# Patient Record
Sex: Female | Born: 1945 | Race: White | Hispanic: No | Marital: Married | Smoking: Never smoker
Health system: Southern US, Community
[De-identification: ages and names within clinical notes are randomized; demographics above are authoritative.]

## PROBLEM LIST (undated history)

## (undated) DIAGNOSIS — I10 Essential (primary) hypertension: Secondary | ICD-10-CM

## (undated) DIAGNOSIS — I447 Left bundle-branch block, unspecified: Secondary | ICD-10-CM

## (undated) DIAGNOSIS — M1990 Unspecified osteoarthritis, unspecified site: Secondary | ICD-10-CM

## (undated) DIAGNOSIS — E079 Disorder of thyroid, unspecified: Secondary | ICD-10-CM

## (undated) HISTORY — PX: OOPHORECTOMY: SHX86

## (undated) HISTORY — PX: APPENDECTOMY: SHX54

## (undated) HISTORY — PX: TUBAL LIGATION: SHX77

## (undated) HISTORY — PX: OTHER SURGICAL HISTORY: SHX169

## (undated) HISTORY — PX: COLONOSCOPY: SHX174

---

## 2004-07-03 HISTORY — PX: OTHER SURGICAL HISTORY: SHX169

## 2005-05-29 ENCOUNTER — Ambulatory Visit (HOSPITAL_COMMUNITY): Admission: RE | Admit: 2005-05-29 | Discharge: 2005-05-29 | Payer: Self-pay | Admitting: Internal Medicine

## 2005-05-29 ENCOUNTER — Ambulatory Visit: Payer: Self-pay | Admitting: Internal Medicine

## 2007-07-04 DIAGNOSIS — E039 Hypothyroidism, unspecified: Secondary | ICD-10-CM

## 2007-07-04 HISTORY — DX: Hypothyroidism, unspecified: E03.9

## 2011-07-06 DIAGNOSIS — J0110 Acute frontal sinusitis, unspecified: Secondary | ICD-10-CM | POA: Diagnosis not present

## 2011-07-27 DIAGNOSIS — M069 Rheumatoid arthritis, unspecified: Secondary | ICD-10-CM | POA: Diagnosis not present

## 2011-08-10 DIAGNOSIS — M069 Rheumatoid arthritis, unspecified: Secondary | ICD-10-CM | POA: Diagnosis not present

## 2011-09-07 DIAGNOSIS — M069 Rheumatoid arthritis, unspecified: Secondary | ICD-10-CM | POA: Diagnosis not present

## 2011-09-14 DIAGNOSIS — Z79899 Other long term (current) drug therapy: Secondary | ICD-10-CM | POA: Diagnosis not present

## 2011-09-14 DIAGNOSIS — M069 Rheumatoid arthritis, unspecified: Secondary | ICD-10-CM | POA: Diagnosis not present

## 2011-11-02 DIAGNOSIS — M069 Rheumatoid arthritis, unspecified: Secondary | ICD-10-CM | POA: Diagnosis not present

## 2011-11-30 DIAGNOSIS — K5289 Other specified noninfective gastroenteritis and colitis: Secondary | ICD-10-CM | POA: Diagnosis not present

## 2011-12-18 DIAGNOSIS — M069 Rheumatoid arthritis, unspecified: Secondary | ICD-10-CM | POA: Diagnosis not present

## 2011-12-26 DIAGNOSIS — M069 Rheumatoid arthritis, unspecified: Secondary | ICD-10-CM | POA: Diagnosis not present

## 2012-01-02 DIAGNOSIS — R1933 Right lower quadrant abdominal rigidity: Secondary | ICD-10-CM | POA: Diagnosis not present

## 2012-01-02 DIAGNOSIS — E039 Hypothyroidism, unspecified: Secondary | ICD-10-CM | POA: Diagnosis not present

## 2012-01-08 DIAGNOSIS — M069 Rheumatoid arthritis, unspecified: Secondary | ICD-10-CM | POA: Diagnosis not present

## 2012-01-08 DIAGNOSIS — E7800 Pure hypercholesterolemia, unspecified: Secondary | ICD-10-CM | POA: Diagnosis not present

## 2012-01-08 DIAGNOSIS — I10 Essential (primary) hypertension: Secondary | ICD-10-CM | POA: Diagnosis not present

## 2012-01-08 DIAGNOSIS — E039 Hypothyroidism, unspecified: Secondary | ICD-10-CM | POA: Diagnosis not present

## 2012-02-22 DIAGNOSIS — M069 Rheumatoid arthritis, unspecified: Secondary | ICD-10-CM | POA: Diagnosis not present

## 2012-04-18 DIAGNOSIS — M069 Rheumatoid arthritis, unspecified: Secondary | ICD-10-CM | POA: Diagnosis not present

## 2012-06-03 DIAGNOSIS — J029 Acute pharyngitis, unspecified: Secondary | ICD-10-CM | POA: Diagnosis not present

## 2012-06-03 DIAGNOSIS — J040 Acute laryngitis: Secondary | ICD-10-CM | POA: Diagnosis not present

## 2012-06-11 DIAGNOSIS — M899 Disorder of bone, unspecified: Secondary | ICD-10-CM | POA: Diagnosis not present

## 2012-06-11 DIAGNOSIS — Z1231 Encounter for screening mammogram for malignant neoplasm of breast: Secondary | ICD-10-CM | POA: Diagnosis not present

## 2012-06-11 DIAGNOSIS — M949 Disorder of cartilage, unspecified: Secondary | ICD-10-CM | POA: Diagnosis not present

## 2012-06-13 DIAGNOSIS — M069 Rheumatoid arthritis, unspecified: Secondary | ICD-10-CM | POA: Diagnosis not present

## 2012-06-19 DIAGNOSIS — Z23 Encounter for immunization: Secondary | ICD-10-CM | POA: Diagnosis not present

## 2012-06-19 DIAGNOSIS — M069 Rheumatoid arthritis, unspecified: Secondary | ICD-10-CM | POA: Diagnosis not present

## 2012-07-01 DIAGNOSIS — E7800 Pure hypercholesterolemia, unspecified: Secondary | ICD-10-CM | POA: Diagnosis not present

## 2012-07-01 DIAGNOSIS — I10 Essential (primary) hypertension: Secondary | ICD-10-CM | POA: Diagnosis not present

## 2012-07-10 DIAGNOSIS — I10 Essential (primary) hypertension: Secondary | ICD-10-CM | POA: Diagnosis not present

## 2012-07-10 DIAGNOSIS — M069 Rheumatoid arthritis, unspecified: Secondary | ICD-10-CM | POA: Diagnosis not present

## 2012-07-10 DIAGNOSIS — E7800 Pure hypercholesterolemia, unspecified: Secondary | ICD-10-CM | POA: Diagnosis not present

## 2012-07-10 DIAGNOSIS — E039 Hypothyroidism, unspecified: Secondary | ICD-10-CM | POA: Diagnosis not present

## 2012-08-08 DIAGNOSIS — M069 Rheumatoid arthritis, unspecified: Secondary | ICD-10-CM | POA: Diagnosis not present

## 2012-10-03 DIAGNOSIS — M069 Rheumatoid arthritis, unspecified: Secondary | ICD-10-CM | POA: Diagnosis not present

## 2012-11-28 DIAGNOSIS — M069 Rheumatoid arthritis, unspecified: Secondary | ICD-10-CM | POA: Diagnosis not present

## 2012-12-03 DIAGNOSIS — M069 Rheumatoid arthritis, unspecified: Secondary | ICD-10-CM | POA: Diagnosis not present

## 2012-12-30 DIAGNOSIS — M069 Rheumatoid arthritis, unspecified: Secondary | ICD-10-CM | POA: Diagnosis not present

## 2012-12-31 DIAGNOSIS — E039 Hypothyroidism, unspecified: Secondary | ICD-10-CM | POA: Diagnosis not present

## 2012-12-31 DIAGNOSIS — E7800 Pure hypercholesterolemia, unspecified: Secondary | ICD-10-CM | POA: Diagnosis not present

## 2013-01-06 DIAGNOSIS — E7800 Pure hypercholesterolemia, unspecified: Secondary | ICD-10-CM | POA: Diagnosis not present

## 2013-01-06 DIAGNOSIS — I10 Essential (primary) hypertension: Secondary | ICD-10-CM | POA: Diagnosis not present

## 2013-01-06 DIAGNOSIS — M069 Rheumatoid arthritis, unspecified: Secondary | ICD-10-CM | POA: Diagnosis not present

## 2013-01-06 DIAGNOSIS — E039 Hypothyroidism, unspecified: Secondary | ICD-10-CM | POA: Diagnosis not present

## 2013-01-07 DIAGNOSIS — M069 Rheumatoid arthritis, unspecified: Secondary | ICD-10-CM | POA: Diagnosis not present

## 2013-04-10 DIAGNOSIS — Z23 Encounter for immunization: Secondary | ICD-10-CM | POA: Diagnosis not present

## 2013-04-10 DIAGNOSIS — M069 Rheumatoid arthritis, unspecified: Secondary | ICD-10-CM | POA: Diagnosis not present

## 2013-06-03 DIAGNOSIS — T887XXA Unspecified adverse effect of drug or medicament, initial encounter: Secondary | ICD-10-CM | POA: Diagnosis not present

## 2013-06-03 DIAGNOSIS — M069 Rheumatoid arthritis, unspecified: Secondary | ICD-10-CM | POA: Diagnosis not present

## 2013-07-01 DIAGNOSIS — E7800 Pure hypercholesterolemia, unspecified: Secondary | ICD-10-CM | POA: Diagnosis not present

## 2013-07-01 DIAGNOSIS — I10 Essential (primary) hypertension: Secondary | ICD-10-CM | POA: Diagnosis not present

## 2013-07-01 DIAGNOSIS — E039 Hypothyroidism, unspecified: Secondary | ICD-10-CM | POA: Diagnosis not present

## 2013-07-08 DIAGNOSIS — I10 Essential (primary) hypertension: Secondary | ICD-10-CM | POA: Diagnosis not present

## 2013-07-08 DIAGNOSIS — Z23 Encounter for immunization: Secondary | ICD-10-CM | POA: Diagnosis not present

## 2013-07-08 DIAGNOSIS — M069 Rheumatoid arthritis, unspecified: Secondary | ICD-10-CM | POA: Diagnosis not present

## 2013-07-08 DIAGNOSIS — E039 Hypothyroidism, unspecified: Secondary | ICD-10-CM | POA: Diagnosis not present

## 2013-07-08 DIAGNOSIS — E7800 Pure hypercholesterolemia, unspecified: Secondary | ICD-10-CM | POA: Diagnosis not present

## 2013-07-14 DIAGNOSIS — D72819 Decreased white blood cell count, unspecified: Secondary | ICD-10-CM | POA: Diagnosis not present

## 2013-07-14 DIAGNOSIS — M069 Rheumatoid arthritis, unspecified: Secondary | ICD-10-CM | POA: Diagnosis not present

## 2013-07-18 DIAGNOSIS — Z01419 Encounter for gynecological examination (general) (routine) without abnormal findings: Secondary | ICD-10-CM | POA: Diagnosis not present

## 2013-07-18 DIAGNOSIS — Z1231 Encounter for screening mammogram for malignant neoplasm of breast: Secondary | ICD-10-CM | POA: Diagnosis not present

## 2013-10-13 DIAGNOSIS — M069 Rheumatoid arthritis, unspecified: Secondary | ICD-10-CM | POA: Diagnosis not present

## 2013-10-13 DIAGNOSIS — D72819 Decreased white blood cell count, unspecified: Secondary | ICD-10-CM | POA: Diagnosis not present

## 2013-11-10 DIAGNOSIS — M069 Rheumatoid arthritis, unspecified: Secondary | ICD-10-CM | POA: Diagnosis not present

## 2013-12-11 DIAGNOSIS — M069 Rheumatoid arthritis, unspecified: Secondary | ICD-10-CM | POA: Diagnosis not present

## 2013-12-22 DIAGNOSIS — M069 Rheumatoid arthritis, unspecified: Secondary | ICD-10-CM | POA: Diagnosis not present

## 2013-12-22 DIAGNOSIS — J209 Acute bronchitis, unspecified: Secondary | ICD-10-CM | POA: Diagnosis not present

## 2013-12-22 DIAGNOSIS — J069 Acute upper respiratory infection, unspecified: Secondary | ICD-10-CM | POA: Diagnosis not present

## 2014-01-05 DIAGNOSIS — E039 Hypothyroidism, unspecified: Secondary | ICD-10-CM | POA: Diagnosis not present

## 2014-01-05 DIAGNOSIS — E7800 Pure hypercholesterolemia, unspecified: Secondary | ICD-10-CM | POA: Diagnosis not present

## 2014-01-05 DIAGNOSIS — I10 Essential (primary) hypertension: Secondary | ICD-10-CM | POA: Diagnosis not present

## 2014-01-05 DIAGNOSIS — M069 Rheumatoid arthritis, unspecified: Secondary | ICD-10-CM | POA: Diagnosis not present

## 2014-01-08 DIAGNOSIS — M069 Rheumatoid arthritis, unspecified: Secondary | ICD-10-CM | POA: Diagnosis not present

## 2014-01-12 DIAGNOSIS — I10 Essential (primary) hypertension: Secondary | ICD-10-CM | POA: Diagnosis not present

## 2014-01-12 DIAGNOSIS — E7800 Pure hypercholesterolemia, unspecified: Secondary | ICD-10-CM | POA: Diagnosis not present

## 2014-01-12 DIAGNOSIS — E039 Hypothyroidism, unspecified: Secondary | ICD-10-CM | POA: Diagnosis not present

## 2014-01-12 DIAGNOSIS — M069 Rheumatoid arthritis, unspecified: Secondary | ICD-10-CM | POA: Diagnosis not present

## 2014-01-20 DIAGNOSIS — D72819 Decreased white blood cell count, unspecified: Secondary | ICD-10-CM | POA: Diagnosis not present

## 2014-01-20 DIAGNOSIS — M069 Rheumatoid arthritis, unspecified: Secondary | ICD-10-CM | POA: Diagnosis not present

## 2014-02-05 DIAGNOSIS — M069 Rheumatoid arthritis, unspecified: Secondary | ICD-10-CM | POA: Diagnosis not present

## 2014-03-23 DIAGNOSIS — M069 Rheumatoid arthritis, unspecified: Secondary | ICD-10-CM | POA: Diagnosis not present

## 2014-04-28 DIAGNOSIS — H2513 Age-related nuclear cataract, bilateral: Secondary | ICD-10-CM | POA: Diagnosis not present

## 2014-04-28 DIAGNOSIS — M0609 Rheumatoid arthritis without rheumatoid factor, multiple sites: Secondary | ICD-10-CM | POA: Diagnosis not present

## 2014-04-28 DIAGNOSIS — Z79899 Other long term (current) drug therapy: Secondary | ICD-10-CM | POA: Diagnosis not present

## 2014-05-25 DIAGNOSIS — D702 Other drug-induced agranulocytosis: Secondary | ICD-10-CM | POA: Diagnosis not present

## 2014-05-25 DIAGNOSIS — M0579 Rheumatoid arthritis with rheumatoid factor of multiple sites without organ or systems involvement: Secondary | ICD-10-CM | POA: Diagnosis not present

## 2014-06-29 DIAGNOSIS — E780 Pure hypercholesterolemia: Secondary | ICD-10-CM | POA: Diagnosis not present

## 2014-06-29 DIAGNOSIS — E039 Hypothyroidism, unspecified: Secondary | ICD-10-CM | POA: Diagnosis not present

## 2014-06-29 DIAGNOSIS — I10 Essential (primary) hypertension: Secondary | ICD-10-CM | POA: Diagnosis not present

## 2014-07-06 DIAGNOSIS — M05719 Rheumatoid arthritis with rheumatoid factor of unspecified shoulder without organ or systems involvement: Secondary | ICD-10-CM | POA: Diagnosis not present

## 2014-07-06 DIAGNOSIS — E039 Hypothyroidism, unspecified: Secondary | ICD-10-CM | POA: Diagnosis not present

## 2014-07-06 DIAGNOSIS — I10 Essential (primary) hypertension: Secondary | ICD-10-CM | POA: Diagnosis not present

## 2014-07-06 DIAGNOSIS — E780 Pure hypercholesterolemia: Secondary | ICD-10-CM | POA: Diagnosis not present

## 2014-07-06 DIAGNOSIS — Z1389 Encounter for screening for other disorder: Secondary | ICD-10-CM | POA: Diagnosis not present

## 2014-07-23 DIAGNOSIS — Z1231 Encounter for screening mammogram for malignant neoplasm of breast: Secondary | ICD-10-CM | POA: Diagnosis not present

## 2014-08-18 DIAGNOSIS — E039 Hypothyroidism, unspecified: Secondary | ICD-10-CM | POA: Diagnosis not present

## 2014-08-18 DIAGNOSIS — Z23 Encounter for immunization: Secondary | ICD-10-CM | POA: Diagnosis not present

## 2014-09-21 DIAGNOSIS — D702 Other drug-induced agranulocytosis: Secondary | ICD-10-CM | POA: Diagnosis not present

## 2014-09-21 DIAGNOSIS — M0579 Rheumatoid arthritis with rheumatoid factor of multiple sites without organ or systems involvement: Secondary | ICD-10-CM | POA: Diagnosis not present

## 2014-12-28 DIAGNOSIS — E039 Hypothyroidism, unspecified: Secondary | ICD-10-CM | POA: Diagnosis not present

## 2014-12-28 DIAGNOSIS — E780 Pure hypercholesterolemia: Secondary | ICD-10-CM | POA: Diagnosis not present

## 2014-12-28 DIAGNOSIS — I10 Essential (primary) hypertension: Secondary | ICD-10-CM | POA: Diagnosis not present

## 2015-01-07 DIAGNOSIS — I10 Essential (primary) hypertension: Secondary | ICD-10-CM | POA: Diagnosis not present

## 2015-01-07 DIAGNOSIS — M0579 Rheumatoid arthritis with rheumatoid factor of multiple sites without organ or systems involvement: Secondary | ICD-10-CM | POA: Diagnosis not present

## 2015-01-07 DIAGNOSIS — E039 Hypothyroidism, unspecified: Secondary | ICD-10-CM | POA: Diagnosis not present

## 2015-01-07 DIAGNOSIS — E780 Pure hypercholesterolemia: Secondary | ICD-10-CM | POA: Diagnosis not present

## 2015-03-25 DIAGNOSIS — M0589 Other rheumatoid arthritis with rheumatoid factor of multiple sites: Secondary | ICD-10-CM | POA: Diagnosis not present

## 2015-03-25 DIAGNOSIS — D702 Other drug-induced agranulocytosis: Secondary | ICD-10-CM | POA: Diagnosis not present

## 2015-04-15 ENCOUNTER — Telehealth: Payer: Self-pay | Admitting: Internal Medicine

## 2015-04-15 ENCOUNTER — Encounter: Payer: Self-pay | Admitting: Internal Medicine

## 2015-04-15 NOTE — Telephone Encounter (Signed)
Letter mailed to pt.  

## 2015-04-15 NOTE — Telephone Encounter (Signed)
NOV RECALL FOE TCS

## 2015-04-22 ENCOUNTER — Telehealth: Payer: Self-pay

## 2015-04-22 NOTE — Telephone Encounter (Signed)
Pt received a letter that it is time for her next colonoscopy.  She gave me some triage info, but wants to call her insurance, before she schedules and she will call back.

## 2015-04-26 ENCOUNTER — Other Ambulatory Visit: Payer: Self-pay

## 2015-04-26 DIAGNOSIS — Z1211 Encounter for screening for malignant neoplasm of colon: Secondary | ICD-10-CM

## 2015-04-26 MED ORDER — PEG 3350-KCL-NA BICARB-NACL 420 G PO SOLR: 4000.0000 mL | ORAL | Status: DC

## 2015-04-26 NOTE — Telephone Encounter (Signed)
Appropriate.

## 2015-04-26 NOTE — Telephone Encounter (Signed)
Rx sent to the pharmacy and instructions mailed to pt.  

## 2015-04-26 NOTE — Telephone Encounter (Signed)
Gastroenterology Pre-Procedure Review  Request Date: 04/22/2015 Requesting Physician: PT WAS ON RECALL LAST COLONOSCOPY WAS 05/29/2005 BY DR. Jena Gauss  PATIENT REVIEW QUESTIONS: The patient responded to the following health history questions as indicated:    1. Diabetes Melitis: no 2. Joint replacements in the past 12 months: no 3. Major health problems in the past 3 months: no 4. Has an artificial valve or MVP: no 5. Has a defibrillator: no 6. Has been advised in past to take antibiotics in advance of a procedure like teeth cleaning: no 7. Family history of colon cancer: no  8. Alcohol Use: no 9. History of sleep apnea: no     MEDICATIONS & ALLERGIES:    Patient reports the following regarding taking any blood thinners:   Plavix? no Aspirin? no Coumadin? no  Patient confirms/reports the following medications:  Current Outpatient Prescriptions  Medication Sig Dispense Refill  . hydroxychloroquine (PLAQUENIL) 200 MG tablet Take 200 mg by mouth daily.    Marland Kitchen levothyroxine (SYNTHROID, LEVOTHROID) 100 MCG tablet Take 100 mcg by mouth daily before breakfast.     No current facility-administered medications for this visit.    Patient confirms/reports the following allergies:  Allergies  Allergen Reactions  . Orencia [Abatacept]     No orders of the defined types were placed in this encounter.    AUTHORIZATION INFORMATION Primary Insurance:   ID #:  Group #:  Pre-Cert / Auth required:  Pre-Cert / Auth #:   Secondary Insurance: ID #:  Group #:  Pre-Cert / Auth required:  Pre-Cert / Auth #:   SCHEDULE INFORMATION: Procedure has been scheduled as follows:  Date:    05/18/2015                 Time:  11:30 am Location: [facility] Short Stay  This Gastroenterology Pre-Precedure Review Form is being routed to the following provider(s): R. Roetta Sessions, MD

## 2015-05-12 ENCOUNTER — Telehealth: Payer: Self-pay

## 2015-05-12 NOTE — Telephone Encounter (Signed)
I called [employer] @ [phone] and spoke to [place] J who said that a PA is not required for screening colonoscopy as outpatient.

## 2015-05-18 ENCOUNTER — Encounter (HOSPITAL_COMMUNITY): Payer: Self-pay | Admitting: *Deleted

## 2015-05-18 ENCOUNTER — Ambulatory Visit (HOSPITAL_COMMUNITY)
Admission: RE | Admit: 2015-05-18 | Discharge: 2015-05-18 | Disposition: A | Discharge status: Home / Self Care | Payer: Medicare Other | Source: Ambulatory Visit | Attending: Internal Medicine | Admitting: Internal Medicine

## 2015-05-18 ENCOUNTER — Encounter (HOSPITAL_COMMUNITY)
Admission: RE | Disposition: A | Discharge status: Home / Self Care | Payer: Self-pay | Source: Ambulatory Visit | Attending: Internal Medicine

## 2015-05-18 DIAGNOSIS — K5730 Diverticulosis of large intestine without perforation or abscess without bleeding: Secondary | ICD-10-CM | POA: Diagnosis not present

## 2015-05-18 DIAGNOSIS — M069 Rheumatoid arthritis, unspecified: Secondary | ICD-10-CM | POA: Diagnosis not present

## 2015-05-18 DIAGNOSIS — Z888 Allergy status to other drugs, medicaments and biological substances status: Secondary | ICD-10-CM | POA: Insufficient documentation

## 2015-05-18 DIAGNOSIS — Z79899 Other long term (current) drug therapy: Secondary | ICD-10-CM | POA: Insufficient documentation

## 2015-05-18 DIAGNOSIS — Z1211 Encounter for screening for malignant neoplasm of colon: Secondary | ICD-10-CM | POA: Insufficient documentation

## 2015-05-18 HISTORY — DX: Unspecified osteoarthritis, unspecified site: M19.90

## 2015-05-18 HISTORY — PX: COLONOSCOPY: SHX5424

## 2015-05-18 SURGERY — COLONOSCOPY
Anesthesia: Moderate Sedation

## 2015-05-18 MED ORDER — MIDAZOLAM HCL 5 MG/5ML IJ SOLN
INTRAMUSCULAR | Status: DC | PRN
  Administered 2015-05-18: 2 mg via INTRAVENOUS
  Administered 2015-05-18 (×3): 1 mg via INTRAVENOUS

## 2015-05-18 MED ORDER — MEPERIDINE HCL 100 MG/ML IJ SOLN
INTRAMUSCULAR | Status: AC
  Filled 2015-05-18: qty 2

## 2015-05-18 MED ORDER — SIMETHICONE 40 MG/0.6ML PO SUSP
ORAL | Status: DC | PRN
  Administered 2015-05-18: 11:00:00

## 2015-05-18 MED ORDER — ONDANSETRON HCL 4 MG/2ML IJ SOLN
INTRAMUSCULAR | Status: AC
  Filled 2015-05-18: qty 2

## 2015-05-18 MED ORDER — MIDAZOLAM HCL 5 MG/5ML IJ SOLN
INTRAMUSCULAR | Status: AC
  Filled 2015-05-18: qty 10

## 2015-05-18 MED ORDER — ONDANSETRON HCL 4 MG/2ML IJ SOLN
INTRAMUSCULAR | Status: DC | PRN
  Administered 2015-05-18: 4 mg via INTRAVENOUS

## 2015-05-18 MED ORDER — SODIUM CHLORIDE 0.9 % IV SOLN
INTRAVENOUS | Status: DC
  Administered 2015-05-18: 11:00:00 via INTRAVENOUS

## 2015-05-18 MED ORDER — MEPERIDINE HCL 100 MG/ML IJ SOLN
INTRAMUSCULAR | Status: DC | PRN
  Administered 2015-05-18: 25 mg via INTRAVENOUS
  Administered 2015-05-18: 50 mg via INTRAVENOUS

## 2015-05-18 NOTE — Discharge Instructions (Signed)
Colonoscopy Discharge Instructions  Read the instructions outlined below and refer to this sheet in the next few weeks. These discharge instructions provide you with general information on caring for yourself after you leave the hospital. Your doctor may also give you specific instructions. While your treatment has been planned according to the most current medical practices available, unavoidable complications occasionally occur. If you have any problems or questions after discharge, call Dr. Jena Gauss at [phone]. ACTIVITY  You may resume your regular activity, but move at a slower pace for the next 24 hours.   Take frequent rest periods for the next 24 hours.   Walking will help get rid of the air and reduce the bloated feeling in your belly (abdomen).   No driving for 24 hours (because of the medicine (anesthesia) used during the test).    Do not sign any important legal documents or operate any machinery for 24 hours (because of the anesthesia used during the test).  NUTRITION  Drink plenty of fluids.   You may resume your normal diet as instructed by your doctor.   Begin with a light meal and progress to your normal diet. Heavy or fried foods are harder to digest and may make you feel sick to your stomach (nauseated).   Avoid alcoholic beverages for 24 hours or as instructed.  MEDICATIONS  You may resume your normal medications unless your doctor tells you otherwise.  WHAT YOU CAN EXPECT TODAY  Some feelings of bloating in the abdomen.   Passage of more gas than usual.   Spotting of blood in your stool or on the toilet paper.  IF YOU HAD POLYPS REMOVED DURING THE COLONOSCOPY:  No aspirin products for 7 days or as instructed.   No alcohol for 7 days or as instructed.   Eat a soft diet for the next 24 hours.  FINDING OUT THE RESULTS OF YOUR TEST Not all test results are available during your visit. If your test results are not back during the visit, make an appointment  with your caregiver to find out the results. Do not assume everything is normal if you have not heard from your caregiver or the medical facility. It is important for you to follow up on all of your test results.  SEEK IMMEDIATE MEDICAL ATTENTION IF:  You have more than a spotting of blood in your stool.   Your belly is swollen (abdominal distention).   You are nauseated or vomiting.   You have a temperature over 101.   You have abdominal pain or discomfort that is severe or gets worse throughout the day.   Diverticulosis information provided  I do not recommend a future screening colonoscopy unless new symptoms develop Diverticulosis Diverticulosis is the condition that develops when small pouches (diverticula) form in the wall of your colon. Your colon, or large intestine, is where water is absorbed and stool is formed. The pouches form when the inside layer of your colon pushes through weak spots in the outer layers of your colon. CAUSES  No one knows exactly what causes diverticulosis. RISK FACTORS  Being older than 50. Your risk for this condition increases with age. Diverticulosis is rare in people younger than 40 years. By age 82, almost everyone has it.  Eating a low-fiber diet.  Being frequently constipated.  Being overweight.  Not getting enough exercise.  Smoking.  Taking over-the-counter pain medicines, like aspirin and ibuprofen. SYMPTOMS  Most people with diverticulosis do not have symptoms. DIAGNOSIS  Because diverticulosis  often has no symptoms, health care providers often discover the condition during an exam for other colon problems. In many cases, a health care provider will diagnose diverticulosis while using a flexible scope to examine the colon (colonoscopy). TREATMENT  If you have never developed an infection related to diverticulosis, you may not need treatment. If you have had an infection before, treatment may include:  Eating more fruits,  vegetables, and grains.  Taking a fiber supplement.  Taking a live bacteria supplement (probiotic).  Taking medicine to relax your colon. HOME CARE INSTRUCTIONS   Drink at least 6-8 glasses of water each day to prevent constipation.  Try not to strain when you have a bowel movement.  Keep all follow-up appointments. If you have had an infection before:  Increase the fiber in your diet as directed by your health care provider or dietitian.  Take a dietary fiber supplement if your health care provider approves.  Only take medicines as directed by your health care provider. SEEK MEDICAL CARE IF:   You have abdominal pain.  You have bloating.  You have cramps.  You have not gone to the bathroom in 3 days. SEEK IMMEDIATE MEDICAL CARE IF:   Your pain gets worse.  Yourbloating becomes very bad.  You have a fever or chills, and your symptoms suddenly get worse.  You begin vomiting.  You have bowel movements that are bloody or black. MAKE SURE YOU:  Understand these instructions.  Will watch your condition.  Will get help right away if you are not doing well or get worse.   This information is not intended to replace advice given to you by your health care provider. Make sure you discuss any questions you have with your health care provider.   Document Released: 03/16/2004 Document Revised: 06/24/2013 Document Reviewed: 05/14/2013 Elsevier Interactive Patient Education [employer]

## 2015-05-18 NOTE — Op Note (Signed)
[facility] [address] [place], [address]   COLONOSCOPY PROCEDURE REPORT  PATIENT: Deborah Kerr, Deborah Kerr  MR#: [id number] BIRTHDATE: Sep 02, 1945 , 69  yrs. old GENDER: female ENDOSCOPIST: R.  Roetta Sessions, MD FACP [facility] REFERRED OT:[facility] Neita Carp, M.D. PROCEDURE DATE:  06/08/15 PROCEDURE:   Colonoscopy, screening INDICATIONS:Average risk colorectal cancer screening examination. MEDICATIONS: Versed 5 mg IV and Demerol 75 mg IV in divided doses. Zofran 4 mg IV. ASA CLASS:       Class II  CONSENT: The risks, benefits, alternatives and imponderables including but not limited to bleeding, perforation as well as the possibility of a missed lesion have been reviewed.  The potential for biopsy, lesion removal, etc. have also been discussed. Questions have been answered.  All parties agreeable.  Please see the history and physical in the medical record for more information.  DESCRIPTION OF PROCEDURE:   After the risks benefits and alternatives of the procedure were thoroughly explained, informed consent was obtained.  The digital rectal exam revealed no abnormalities of the rectum.   The EC-[id number] ([id number])  endoscope was introduced through the anus and advanced to the cecum, which was identified by both the appendix and ileocecal valve. No adverse events experienced.   The quality of the prep was adequate  The instrument was then slowly withdrawn as the colon was fully examined. Estimated blood loss is zero unless otherwise noted in this procedure report.      COLON FINDINGS: Normal-appearing rectal mucosa.  Scattered left-sided diverticula; the remainder the colonic mucosa appeared normal.  Retroflexion was performed. .  Withdrawal time=6 minutes 0 seconds.  The scope was withdrawn and the procedure completed. COMPLICATIONS: There were no immediate complications.  ENDOSCOPIC IMPRESSION: Colonic diverticulosis  RECOMMENDATIONS: I do not recommend a future  colonoscopy unless new symptoms develop.  eSigned:  R. Roetta Sessions, MD Jerrel Ivory [facility] 2015-06-08 12:06 PM   cc:  CPT CODES: ICD CODES:  The ICD and CPT codes recommended by this software are interpretations from the data that the clinical staff has captured with the software.  The verification of the translation of this report to the ICD and CPT codes and modifiers is the sole responsibility of the health care institution and practicing physician where this report was generated.  [employer] will not be held responsible for the validity of the ICD and CPT codes included on this report.  AMA assumes no liability for data contained or not contained herein. CPT is a Publishing rights manager of the [employer].  PATIENT NAME:  Deborah Kerr, Deborah Kerr MR#: [id number]

## 2015-05-18 NOTE — H&P (Signed)
@  JOAC@   Primary Care Physician:  Estanislado Pandy, MD Primary Gastroenterologist:  Dr. Jena Gauss  Pre-Procedure History & Physical: HPI:  Deborah Kerr is a 69 y.o. female is here for  a screening colonoscopy.  No bowel symptoms. No family history of colon cancer. Negative Colonoscopy 10 years ago.     Past Medical History  Diagnosis Date  . Arthritis     rheumatoid arthritis    Past Surgical History  Procedure Laterality Date  . Thyroidectomy    . Oophorectomy    . Colonoscopy      Prior to Admission medications   Medication Sig Start Date End Date Taking? Authorizing Provider  hydroxychloroquine (PLAQUENIL) 200 MG tablet Take 200 mg by mouth daily.   Yes Historical Provider, MD  ibuprofen (ADVIL,MOTRIN) 200 MG tablet Take 200 mg by mouth every 6 (six) hours as needed.   Yes Historical Provider, MD  leAverage risk screening examination.vothyroxine (SYNTHROID, LEVOTHROID) 100 MCG tablet Take 100 mcg by mouth daily before breakfast.   Yes Historical Provider, MD  polyethylene glycol-electrolytes (TRILYTE) 420 G solution Take 4,000 mLs by mouth as directed. 04/26/15  Yes Corbin Ade, MD    Allergies as of 04/26/2015 - Review Complete 04/26/2015  Allergen Reaction Noted  . Orencia [abatacept]  04/26/2015    History reviewed. No pertinent family history.  Social History   Social History  . Marital Status: Married    Spouse Name: N/A  . Number of Children: N/A  . Years of Education: N/A   Occupational History  . Not on file.   Social History Main Topics  . Smoking status: Never Smoker   . Smokeless tobacco: Not on file  . Alcohol Use: No  . Drug Use: No  . Sexual Activity: Not on file   Other Topics Concern  . Not on file   Social History Narrative  . No narrative on file    Review of Systems: See HPI, otherwise negative ROS  Physical Exam: BP 160/79 mmHg  Pulse 73  Temp(Src) 97.7 F (36.5 C) (Oral)  Resp 12  Ht 5\' 3"  (1.6 m)  Wt 137 lb (62.143 kg)   BMI 24.27 kg/m2  SpO2 100% General:   Alert,  Well-developed, well-nourished, pleasant and cooperative in NAD Head:  Normocephalic and atraumatic. Eyes:  Sclera clear, no icterus.   Conjunctiva pink. Lungs:  Clear throughout to auscultation.   No wheezes, crackles, or rhonchi. No acute distress. Heart:  Regular rate and rhythm; no murmurs, clicks, rubs,  or gallops. Abdomen:  Soft, nontender and nondistended. No masses, hepatosplenomegaly or hernias noted. Normal bowel sounds, without guarding, and without rebound.   Extremities:  Without clubbing or edema. Neurologic:  Alert and  oriented x4;  grossly normal neurologically.   Impression/Plan: Deborah Kerr is now here to undergo a screening colonoscopy.   Risks, benefits, limitations, imponderables and alternatives regarding colonoscopy have been reviewed with the patient. Questions have been answered. All parties agreeable.     Notice:  This dictation was prepared with Dragon dictation along with smaller phrase technology. Any transcriptional errors that result from this process are unintentional and may not be corrected upon review.

## 2015-05-20 DIAGNOSIS — Z23 Encounter for immunization: Secondary | ICD-10-CM | POA: Diagnosis not present

## 2015-05-25 ENCOUNTER — Encounter (HOSPITAL_COMMUNITY): Payer: Self-pay | Admitting: Internal Medicine

## 2015-06-22 DIAGNOSIS — M0609 Rheumatoid arthritis without rheumatoid factor, multiple sites: Secondary | ICD-10-CM | POA: Diagnosis not present

## 2015-06-22 DIAGNOSIS — H2513 Age-related nuclear cataract, bilateral: Secondary | ICD-10-CM | POA: Diagnosis not present

## 2015-06-22 DIAGNOSIS — Z79899 Other long term (current) drug therapy: Secondary | ICD-10-CM | POA: Diagnosis not present

## 2015-07-22 DIAGNOSIS — E039 Hypothyroidism, unspecified: Secondary | ICD-10-CM | POA: Diagnosis not present

## 2015-07-28 DIAGNOSIS — M0579 Rheumatoid arthritis with rheumatoid factor of multiple sites without organ or systems involvement: Secondary | ICD-10-CM | POA: Diagnosis not present

## 2015-07-28 DIAGNOSIS — I10 Essential (primary) hypertension: Secondary | ICD-10-CM | POA: Diagnosis not present

## 2015-07-28 DIAGNOSIS — E039 Hypothyroidism, unspecified: Secondary | ICD-10-CM | POA: Diagnosis not present

## 2015-07-28 DIAGNOSIS — Z1389 Encounter for screening for other disorder: Secondary | ICD-10-CM | POA: Diagnosis not present

## 2015-08-21 DIAGNOSIS — J111 Influenza due to unidentified influenza virus with other respiratory manifestations: Secondary | ICD-10-CM | POA: Diagnosis not present

## 2015-09-01 DIAGNOSIS — J209 Acute bronchitis, unspecified: Secondary | ICD-10-CM | POA: Diagnosis not present

## 2015-09-03 DIAGNOSIS — Z01419 Encounter for gynecological examination (general) (routine) without abnormal findings: Secondary | ICD-10-CM | POA: Diagnosis not present

## 2015-09-03 DIAGNOSIS — Z1231 Encounter for screening mammogram for malignant neoplasm of breast: Secondary | ICD-10-CM | POA: Diagnosis not present

## 2015-09-22 DIAGNOSIS — M0589 Other rheumatoid arthritis with rheumatoid factor of multiple sites: Secondary | ICD-10-CM | POA: Diagnosis not present

## 2015-09-22 DIAGNOSIS — D702 Other drug-induced agranulocytosis: Secondary | ICD-10-CM | POA: Diagnosis not present

## 2015-09-22 DIAGNOSIS — M7989 Other specified soft tissue disorders: Secondary | ICD-10-CM | POA: Diagnosis not present

## 2016-01-21 DIAGNOSIS — I10 Essential (primary) hypertension: Secondary | ICD-10-CM | POA: Diagnosis not present

## 2016-01-21 DIAGNOSIS — E7800 Pure hypercholesterolemia, unspecified: Secondary | ICD-10-CM | POA: Diagnosis not present

## 2016-01-21 DIAGNOSIS — E039 Hypothyroidism, unspecified: Secondary | ICD-10-CM | POA: Diagnosis not present

## 2016-01-21 DIAGNOSIS — M05719 Rheumatoid arthritis with rheumatoid factor of unspecified shoulder without organ or systems involvement: Secondary | ICD-10-CM | POA: Diagnosis not present

## 2016-01-24 DIAGNOSIS — Z6824 Body mass index (BMI) 24.0-24.9, adult: Secondary | ICD-10-CM | POA: Diagnosis not present

## 2016-01-24 DIAGNOSIS — I10 Essential (primary) hypertension: Secondary | ICD-10-CM | POA: Diagnosis not present

## 2016-01-24 DIAGNOSIS — Z13220 Encounter for screening for lipoid disorders: Secondary | ICD-10-CM | POA: Diagnosis not present

## 2016-01-24 DIAGNOSIS — E039 Hypothyroidism, unspecified: Secondary | ICD-10-CM | POA: Diagnosis not present

## 2016-01-24 DIAGNOSIS — M0579 Rheumatoid arthritis with rheumatoid factor of multiple sites without organ or systems involvement: Secondary | ICD-10-CM | POA: Diagnosis not present

## 2016-02-29 ENCOUNTER — Other Ambulatory Visit: Payer: Self-pay

## 2016-03-24 DIAGNOSIS — D702 Other drug-induced agranulocytosis: Secondary | ICD-10-CM | POA: Diagnosis not present

## 2016-03-24 DIAGNOSIS — M0589 Other rheumatoid arthritis with rheumatoid factor of multiple sites: Secondary | ICD-10-CM | POA: Diagnosis not present

## 2016-03-24 DIAGNOSIS — Z79899 Other long term (current) drug therapy: Secondary | ICD-10-CM | POA: Diagnosis not present

## 2016-06-02 DIAGNOSIS — Z23 Encounter for immunization: Secondary | ICD-10-CM | POA: Diagnosis not present

## 2016-06-30 DIAGNOSIS — Z79899 Other long term (current) drug therapy: Secondary | ICD-10-CM | POA: Diagnosis not present

## 2016-06-30 DIAGNOSIS — H2513 Age-related nuclear cataract, bilateral: Secondary | ICD-10-CM | POA: Diagnosis not present

## 2016-06-30 DIAGNOSIS — Z09 Encounter for follow-up examination after completed treatment for conditions other than malignant neoplasm: Secondary | ICD-10-CM | POA: Diagnosis not present

## 2016-06-30 DIAGNOSIS — H04123 Dry eye syndrome of bilateral lacrimal glands: Secondary | ICD-10-CM | POA: Diagnosis not present

## 2016-06-30 DIAGNOSIS — M059 Rheumatoid arthritis with rheumatoid factor, unspecified: Secondary | ICD-10-CM | POA: Diagnosis not present

## 2016-07-24 DIAGNOSIS — E039 Hypothyroidism, unspecified: Secondary | ICD-10-CM | POA: Diagnosis not present

## 2016-07-24 DIAGNOSIS — M0579 Rheumatoid arthritis with rheumatoid factor of multiple sites without organ or systems involvement: Secondary | ICD-10-CM | POA: Diagnosis not present

## 2016-07-24 DIAGNOSIS — I10 Essential (primary) hypertension: Secondary | ICD-10-CM | POA: Diagnosis not present

## 2016-07-24 DIAGNOSIS — E7800 Pure hypercholesterolemia, unspecified: Secondary | ICD-10-CM | POA: Diagnosis not present

## 2016-07-25 DIAGNOSIS — E7800 Pure hypercholesterolemia, unspecified: Secondary | ICD-10-CM | POA: Diagnosis not present

## 2016-07-25 DIAGNOSIS — I10 Essential (primary) hypertension: Secondary | ICD-10-CM | POA: Diagnosis not present

## 2016-07-25 DIAGNOSIS — E039 Hypothyroidism, unspecified: Secondary | ICD-10-CM | POA: Diagnosis not present

## 2016-07-25 DIAGNOSIS — M0579 Rheumatoid arthritis with rheumatoid factor of multiple sites without organ or systems involvement: Secondary | ICD-10-CM | POA: Diagnosis not present

## 2016-07-25 DIAGNOSIS — Z6824 Body mass index (BMI) 24.0-24.9, adult: Secondary | ICD-10-CM | POA: Diagnosis not present

## 2016-09-07 DIAGNOSIS — Z1231 Encounter for screening mammogram for malignant neoplasm of breast: Secondary | ICD-10-CM | POA: Diagnosis not present

## 2016-09-21 DIAGNOSIS — M0589 Other rheumatoid arthritis with rheumatoid factor of multiple sites: Secondary | ICD-10-CM | POA: Diagnosis not present

## 2016-09-21 DIAGNOSIS — M545 Low back pain: Secondary | ICD-10-CM | POA: Diagnosis not present

## 2016-09-21 DIAGNOSIS — Z6824 Body mass index (BMI) 24.0-24.9, adult: Secondary | ICD-10-CM | POA: Diagnosis not present

## 2016-09-21 DIAGNOSIS — D702 Other drug-induced agranulocytosis: Secondary | ICD-10-CM | POA: Diagnosis not present

## 2016-09-21 DIAGNOSIS — M7989 Other specified soft tissue disorders: Secondary | ICD-10-CM | POA: Diagnosis not present

## 2016-09-21 DIAGNOSIS — Z79899 Other long term (current) drug therapy: Secondary | ICD-10-CM | POA: Diagnosis not present

## 2016-11-09 DIAGNOSIS — M8588 Other specified disorders of bone density and structure, other site: Secondary | ICD-10-CM | POA: Diagnosis not present

## 2016-11-09 DIAGNOSIS — M069 Rheumatoid arthritis, unspecified: Secondary | ICD-10-CM | POA: Diagnosis not present

## 2016-11-09 DIAGNOSIS — M85851 Other specified disorders of bone density and structure, right thigh: Secondary | ICD-10-CM | POA: Diagnosis not present

## 2016-11-09 DIAGNOSIS — Z79899 Other long term (current) drug therapy: Secondary | ICD-10-CM | POA: Diagnosis not present

## 2016-11-09 DIAGNOSIS — Z981 Arthrodesis status: Secondary | ICD-10-CM | POA: Diagnosis not present

## 2016-11-09 DIAGNOSIS — E890 Postprocedural hypothyroidism: Secondary | ICD-10-CM | POA: Diagnosis not present

## 2017-01-19 DIAGNOSIS — E039 Hypothyroidism, unspecified: Secondary | ICD-10-CM | POA: Diagnosis not present

## 2017-01-19 DIAGNOSIS — E7800 Pure hypercholesterolemia, unspecified: Secondary | ICD-10-CM | POA: Diagnosis not present

## 2017-01-19 DIAGNOSIS — M05719 Rheumatoid arthritis with rheumatoid factor of unspecified shoulder without organ or systems involvement: Secondary | ICD-10-CM | POA: Diagnosis not present

## 2017-01-19 DIAGNOSIS — I10 Essential (primary) hypertension: Secondary | ICD-10-CM | POA: Diagnosis not present

## 2017-01-22 DIAGNOSIS — M0579 Rheumatoid arthritis with rheumatoid factor of multiple sites without organ or systems involvement: Secondary | ICD-10-CM | POA: Diagnosis not present

## 2017-01-22 DIAGNOSIS — Z1389 Encounter for screening for other disorder: Secondary | ICD-10-CM | POA: Diagnosis not present

## 2017-01-22 DIAGNOSIS — E039 Hypothyroidism, unspecified: Secondary | ICD-10-CM | POA: Diagnosis not present

## 2017-01-22 DIAGNOSIS — Z6824 Body mass index (BMI) 24.0-24.9, adult: Secondary | ICD-10-CM | POA: Diagnosis not present

## 2017-01-22 DIAGNOSIS — I10 Essential (primary) hypertension: Secondary | ICD-10-CM | POA: Diagnosis not present

## 2017-03-26 DIAGNOSIS — D702 Other drug-induced agranulocytosis: Secondary | ICD-10-CM | POA: Diagnosis not present

## 2017-03-26 DIAGNOSIS — M545 Low back pain: Secondary | ICD-10-CM | POA: Diagnosis not present

## 2017-03-26 DIAGNOSIS — M0589 Other rheumatoid arthritis with rheumatoid factor of multiple sites: Secondary | ICD-10-CM | POA: Diagnosis not present

## 2017-03-26 DIAGNOSIS — Z79899 Other long term (current) drug therapy: Secondary | ICD-10-CM | POA: Diagnosis not present

## 2017-03-26 DIAGNOSIS — Z6824 Body mass index (BMI) 24.0-24.9, adult: Secondary | ICD-10-CM | POA: Diagnosis not present

## 2017-04-27 DIAGNOSIS — Z23 Encounter for immunization: Secondary | ICD-10-CM | POA: Diagnosis not present

## 2017-07-17 DIAGNOSIS — E039 Hypothyroidism, unspecified: Secondary | ICD-10-CM | POA: Diagnosis not present

## 2018-07-04 DIAGNOSIS — H02834 Dermatochalasis of left upper eyelid: Secondary | ICD-10-CM | POA: Diagnosis not present

## 2018-07-04 DIAGNOSIS — H02831 Dermatochalasis of right upper eyelid: Secondary | ICD-10-CM | POA: Diagnosis not present

## 2018-07-04 DIAGNOSIS — M0580 Other rheumatoid arthritis with rheumatoid factor of unspecified site: Secondary | ICD-10-CM | POA: Diagnosis not present

## 2018-07-04 DIAGNOSIS — Z049 Encounter for examination and observation for unspecified reason: Secondary | ICD-10-CM | POA: Diagnosis not present

## 2018-07-04 DIAGNOSIS — Z79899 Other long term (current) drug therapy: Secondary | ICD-10-CM | POA: Diagnosis not present

## 2018-07-04 DIAGNOSIS — H2513 Age-related nuclear cataract, bilateral: Secondary | ICD-10-CM | POA: Diagnosis not present

## 2018-07-04 DIAGNOSIS — Z09 Encounter for follow-up examination after completed treatment for conditions other than malignant neoplasm: Secondary | ICD-10-CM | POA: Diagnosis not present

## 2018-07-11 DIAGNOSIS — M0579 Rheumatoid arthritis with rheumatoid factor of multiple sites without organ or systems involvement: Secondary | ICD-10-CM | POA: Diagnosis not present

## 2018-07-11 DIAGNOSIS — E782 Mixed hyperlipidemia: Secondary | ICD-10-CM | POA: Diagnosis not present

## 2018-07-11 DIAGNOSIS — I10 Essential (primary) hypertension: Secondary | ICD-10-CM | POA: Diagnosis not present

## 2018-07-11 DIAGNOSIS — E039 Hypothyroidism, unspecified: Secondary | ICD-10-CM | POA: Diagnosis not present

## 2018-07-15 DIAGNOSIS — Z1331 Encounter for screening for depression: Secondary | ICD-10-CM | POA: Diagnosis not present

## 2018-07-15 DIAGNOSIS — E782 Mixed hyperlipidemia: Secondary | ICD-10-CM | POA: Diagnosis not present

## 2018-07-15 DIAGNOSIS — I10 Essential (primary) hypertension: Secondary | ICD-10-CM | POA: Diagnosis not present

## 2018-07-15 DIAGNOSIS — E039 Hypothyroidism, unspecified: Secondary | ICD-10-CM | POA: Diagnosis not present

## 2018-07-15 DIAGNOSIS — Z6826 Body mass index (BMI) 26.0-26.9, adult: Secondary | ICD-10-CM | POA: Diagnosis not present

## 2018-07-15 DIAGNOSIS — Z1389 Encounter for screening for other disorder: Secondary | ICD-10-CM | POA: Diagnosis not present

## 2018-07-15 DIAGNOSIS — M0579 Rheumatoid arthritis with rheumatoid factor of multiple sites without organ or systems involvement: Secondary | ICD-10-CM | POA: Diagnosis not present

## 2018-09-16 DIAGNOSIS — Z1231 Encounter for screening mammogram for malignant neoplasm of breast: Secondary | ICD-10-CM | POA: Diagnosis not present

## 2018-09-30 DIAGNOSIS — Z6824 Body mass index (BMI) 24.0-24.9, adult: Secondary | ICD-10-CM | POA: Diagnosis not present

## 2018-09-30 DIAGNOSIS — D702 Other drug-induced agranulocytosis: Secondary | ICD-10-CM | POA: Diagnosis not present

## 2018-09-30 DIAGNOSIS — R768 Other specified abnormal immunological findings in serum: Secondary | ICD-10-CM | POA: Diagnosis not present

## 2018-09-30 DIAGNOSIS — M545 Low back pain: Secondary | ICD-10-CM | POA: Diagnosis not present

## 2018-09-30 DIAGNOSIS — Z79899 Other long term (current) drug therapy: Secondary | ICD-10-CM | POA: Diagnosis not present

## 2018-09-30 DIAGNOSIS — M0589 Other rheumatoid arthritis with rheumatoid factor of multiple sites: Secondary | ICD-10-CM | POA: Diagnosis not present

## 2018-09-30 DIAGNOSIS — R51 Headache: Secondary | ICD-10-CM | POA: Diagnosis not present

## 2018-12-31 DIAGNOSIS — Z6824 Body mass index (BMI) 24.0-24.9, adult: Secondary | ICD-10-CM | POA: Diagnosis not present

## 2018-12-31 DIAGNOSIS — M0589 Other rheumatoid arthritis with rheumatoid factor of multiple sites: Secondary | ICD-10-CM | POA: Diagnosis not present

## 2018-12-31 DIAGNOSIS — M5432 Sciatica, left side: Secondary | ICD-10-CM | POA: Diagnosis not present

## 2018-12-31 DIAGNOSIS — Z79899 Other long term (current) drug therapy: Secondary | ICD-10-CM | POA: Diagnosis not present

## 2018-12-31 DIAGNOSIS — R768 Other specified abnormal immunological findings in serum: Secondary | ICD-10-CM | POA: Diagnosis not present

## 2018-12-31 DIAGNOSIS — D702 Other drug-induced agranulocytosis: Secondary | ICD-10-CM | POA: Diagnosis not present

## 2019-01-08 DIAGNOSIS — E039 Hypothyroidism, unspecified: Secondary | ICD-10-CM | POA: Diagnosis not present

## 2019-01-08 DIAGNOSIS — E782 Mixed hyperlipidemia: Secondary | ICD-10-CM | POA: Diagnosis not present

## 2019-01-08 DIAGNOSIS — E7801 Familial hypercholesterolemia: Secondary | ICD-10-CM | POA: Diagnosis not present

## 2019-01-08 DIAGNOSIS — I10 Essential (primary) hypertension: Secondary | ICD-10-CM | POA: Diagnosis not present

## 2019-01-08 DIAGNOSIS — M05719 Rheumatoid arthritis with rheumatoid factor of unspecified shoulder without organ or systems involvement: Secondary | ICD-10-CM | POA: Diagnosis not present

## 2019-01-15 DIAGNOSIS — Z6826 Body mass index (BMI) 26.0-26.9, adult: Secondary | ICD-10-CM | POA: Diagnosis not present

## 2019-01-15 DIAGNOSIS — Z0000 Encounter for general adult medical examination without abnormal findings: Secondary | ICD-10-CM | POA: Diagnosis not present

## 2019-01-15 DIAGNOSIS — I10 Essential (primary) hypertension: Secondary | ICD-10-CM | POA: Diagnosis not present

## 2019-01-15 DIAGNOSIS — M0579 Rheumatoid arthritis with rheumatoid factor of multiple sites without organ or systems involvement: Secondary | ICD-10-CM | POA: Diagnosis not present

## 2019-01-15 DIAGNOSIS — E782 Mixed hyperlipidemia: Secondary | ICD-10-CM | POA: Diagnosis not present

## 2019-01-15 DIAGNOSIS — E039 Hypothyroidism, unspecified: Secondary | ICD-10-CM | POA: Diagnosis not present

## 2019-02-26 DIAGNOSIS — E039 Hypothyroidism, unspecified: Secondary | ICD-10-CM | POA: Diagnosis not present

## 2019-04-02 DIAGNOSIS — R768 Other specified abnormal immunological findings in serum: Secondary | ICD-10-CM | POA: Diagnosis not present

## 2019-04-02 DIAGNOSIS — M0589 Other rheumatoid arthritis with rheumatoid factor of multiple sites: Secondary | ICD-10-CM | POA: Diagnosis not present

## 2019-04-02 DIAGNOSIS — M5432 Sciatica, left side: Secondary | ICD-10-CM | POA: Diagnosis not present

## 2019-04-02 DIAGNOSIS — D702 Other drug-induced agranulocytosis: Secondary | ICD-10-CM | POA: Diagnosis not present

## 2019-04-02 DIAGNOSIS — Z79899 Other long term (current) drug therapy: Secondary | ICD-10-CM | POA: Diagnosis not present

## 2019-04-02 DIAGNOSIS — Z6823 Body mass index (BMI) 23.0-23.9, adult: Secondary | ICD-10-CM | POA: Diagnosis not present

## 2019-06-02 DIAGNOSIS — E039 Hypothyroidism, unspecified: Secondary | ICD-10-CM | POA: Diagnosis not present

## 2019-06-02 DIAGNOSIS — E782 Mixed hyperlipidemia: Secondary | ICD-10-CM | POA: Diagnosis not present

## 2019-06-02 DIAGNOSIS — I10 Essential (primary) hypertension: Secondary | ICD-10-CM | POA: Diagnosis not present

## 2019-07-03 DIAGNOSIS — I10 Essential (primary) hypertension: Secondary | ICD-10-CM | POA: Diagnosis not present

## 2019-07-03 DIAGNOSIS — E782 Mixed hyperlipidemia: Secondary | ICD-10-CM | POA: Diagnosis not present

## 2019-07-04 HISTORY — PX: EYE SURGERY: SHX253

## 2019-07-24 DIAGNOSIS — M0589 Other rheumatoid arthritis with rheumatoid factor of multiple sites: Secondary | ICD-10-CM | POA: Diagnosis not present

## 2019-07-24 DIAGNOSIS — Z79899 Other long term (current) drug therapy: Secondary | ICD-10-CM | POA: Diagnosis not present

## 2019-07-24 DIAGNOSIS — R768 Other specified abnormal immunological findings in serum: Secondary | ICD-10-CM | POA: Diagnosis not present

## 2019-07-24 DIAGNOSIS — M5432 Sciatica, left side: Secondary | ICD-10-CM | POA: Diagnosis not present

## 2019-07-24 DIAGNOSIS — D702 Other drug-induced agranulocytosis: Secondary | ICD-10-CM | POA: Diagnosis not present

## 2019-07-28 DIAGNOSIS — I10 Essential (primary) hypertension: Secondary | ICD-10-CM | POA: Diagnosis not present

## 2019-07-28 DIAGNOSIS — E782 Mixed hyperlipidemia: Secondary | ICD-10-CM | POA: Diagnosis not present

## 2019-07-28 DIAGNOSIS — E039 Hypothyroidism, unspecified: Secondary | ICD-10-CM | POA: Diagnosis not present

## 2019-07-28 DIAGNOSIS — Z6826 Body mass index (BMI) 26.0-26.9, adult: Secondary | ICD-10-CM | POA: Diagnosis not present

## 2019-07-28 DIAGNOSIS — M0579 Rheumatoid arthritis with rheumatoid factor of multiple sites without organ or systems involvement: Secondary | ICD-10-CM | POA: Diagnosis not present

## 2019-08-29 DIAGNOSIS — E7849 Other hyperlipidemia: Secondary | ICD-10-CM | POA: Diagnosis not present

## 2019-08-29 DIAGNOSIS — I10 Essential (primary) hypertension: Secondary | ICD-10-CM | POA: Diagnosis not present

## 2019-09-18 DIAGNOSIS — Z01419 Encounter for gynecological examination (general) (routine) without abnormal findings: Secondary | ICD-10-CM | POA: Diagnosis not present

## 2019-09-18 DIAGNOSIS — Z1231 Encounter for screening mammogram for malignant neoplasm of breast: Secondary | ICD-10-CM | POA: Diagnosis not present

## 2019-10-01 DIAGNOSIS — I10 Essential (primary) hypertension: Secondary | ICD-10-CM | POA: Diagnosis not present

## 2019-10-01 DIAGNOSIS — E7849 Other hyperlipidemia: Secondary | ICD-10-CM | POA: Diagnosis not present

## 2019-10-22 DIAGNOSIS — R768 Other specified abnormal immunological findings in serum: Secondary | ICD-10-CM | POA: Diagnosis not present

## 2019-10-22 DIAGNOSIS — Z79899 Other long term (current) drug therapy: Secondary | ICD-10-CM | POA: Diagnosis not present

## 2019-10-22 DIAGNOSIS — M0589 Other rheumatoid arthritis with rheumatoid factor of multiple sites: Secondary | ICD-10-CM | POA: Diagnosis not present

## 2019-10-22 DIAGNOSIS — D702 Other drug-induced agranulocytosis: Secondary | ICD-10-CM | POA: Diagnosis not present

## 2019-10-22 DIAGNOSIS — M5432 Sciatica, left side: Secondary | ICD-10-CM | POA: Diagnosis not present

## 2019-10-22 DIAGNOSIS — Z6823 Body mass index (BMI) 23.0-23.9, adult: Secondary | ICD-10-CM | POA: Diagnosis not present

## 2019-12-01 DIAGNOSIS — E039 Hypothyroidism, unspecified: Secondary | ICD-10-CM | POA: Diagnosis not present

## 2019-12-01 DIAGNOSIS — E7849 Other hyperlipidemia: Secondary | ICD-10-CM | POA: Diagnosis not present

## 2019-12-01 DIAGNOSIS — I10 Essential (primary) hypertension: Secondary | ICD-10-CM | POA: Diagnosis not present

## 2019-12-09 DIAGNOSIS — M545 Low back pain: Secondary | ICD-10-CM | POA: Diagnosis not present

## 2019-12-09 DIAGNOSIS — Z6825 Body mass index (BMI) 25.0-25.9, adult: Secondary | ICD-10-CM | POA: Diagnosis not present

## 2019-12-09 DIAGNOSIS — M5430 Sciatica, unspecified side: Secondary | ICD-10-CM | POA: Diagnosis not present

## 2020-01-14 DIAGNOSIS — H02831 Dermatochalasis of right upper eyelid: Secondary | ICD-10-CM | POA: Diagnosis not present

## 2020-01-14 DIAGNOSIS — M069 Rheumatoid arthritis, unspecified: Secondary | ICD-10-CM | POA: Diagnosis not present

## 2020-01-14 DIAGNOSIS — H2513 Age-related nuclear cataract, bilateral: Secondary | ICD-10-CM | POA: Diagnosis not present

## 2020-01-14 DIAGNOSIS — Z79899 Other long term (current) drug therapy: Secondary | ICD-10-CM | POA: Diagnosis not present

## 2020-01-14 DIAGNOSIS — H02834 Dermatochalasis of left upper eyelid: Secondary | ICD-10-CM | POA: Diagnosis not present

## 2020-01-19 DIAGNOSIS — E7800 Pure hypercholesterolemia, unspecified: Secondary | ICD-10-CM | POA: Diagnosis not present

## 2020-01-19 DIAGNOSIS — E782 Mixed hyperlipidemia: Secondary | ICD-10-CM | POA: Diagnosis not present

## 2020-01-19 DIAGNOSIS — I10 Essential (primary) hypertension: Secondary | ICD-10-CM | POA: Diagnosis not present

## 2020-01-19 DIAGNOSIS — E039 Hypothyroidism, unspecified: Secondary | ICD-10-CM | POA: Diagnosis not present

## 2020-01-19 DIAGNOSIS — M05719 Rheumatoid arthritis with rheumatoid factor of unspecified shoulder without organ or systems involvement: Secondary | ICD-10-CM | POA: Diagnosis not present

## 2020-01-22 DIAGNOSIS — E782 Mixed hyperlipidemia: Secondary | ICD-10-CM | POA: Diagnosis not present

## 2020-01-22 DIAGNOSIS — I10 Essential (primary) hypertension: Secondary | ICD-10-CM | POA: Diagnosis not present

## 2020-01-22 DIAGNOSIS — Z6823 Body mass index (BMI) 23.0-23.9, adult: Secondary | ICD-10-CM | POA: Diagnosis not present

## 2020-01-22 DIAGNOSIS — M0579 Rheumatoid arthritis with rheumatoid factor of multiple sites without organ or systems involvement: Secondary | ICD-10-CM | POA: Diagnosis not present

## 2020-01-22 DIAGNOSIS — E039 Hypothyroidism, unspecified: Secondary | ICD-10-CM | POA: Diagnosis not present

## 2020-01-22 DIAGNOSIS — Z0000 Encounter for general adult medical examination without abnormal findings: Secondary | ICD-10-CM | POA: Diagnosis not present

## 2020-02-19 DIAGNOSIS — H2512 Age-related nuclear cataract, left eye: Secondary | ICD-10-CM | POA: Diagnosis not present

## 2020-03-28 DIAGNOSIS — H2511 Age-related nuclear cataract, right eye: Secondary | ICD-10-CM | POA: Diagnosis not present

## 2020-04-01 DIAGNOSIS — H2511 Age-related nuclear cataract, right eye: Secondary | ICD-10-CM | POA: Diagnosis not present

## 2020-04-09 DIAGNOSIS — Z6822 Body mass index (BMI) 22.0-22.9, adult: Secondary | ICD-10-CM | POA: Diagnosis not present

## 2020-04-09 DIAGNOSIS — Z23 Encounter for immunization: Secondary | ICD-10-CM | POA: Diagnosis not present

## 2020-04-09 DIAGNOSIS — I10 Essential (primary) hypertension: Secondary | ICD-10-CM | POA: Diagnosis not present

## 2020-04-22 DIAGNOSIS — R768 Other specified abnormal immunological findings in serum: Secondary | ICD-10-CM | POA: Diagnosis not present

## 2020-04-22 DIAGNOSIS — Z6822 Body mass index (BMI) 22.0-22.9, adult: Secondary | ICD-10-CM | POA: Diagnosis not present

## 2020-04-22 DIAGNOSIS — M0589 Other rheumatoid arthritis with rheumatoid factor of multiple sites: Secondary | ICD-10-CM | POA: Diagnosis not present

## 2020-04-22 DIAGNOSIS — D702 Other drug-induced agranulocytosis: Secondary | ICD-10-CM | POA: Diagnosis not present

## 2020-04-22 DIAGNOSIS — M5432 Sciatica, left side: Secondary | ICD-10-CM | POA: Diagnosis not present

## 2020-04-22 DIAGNOSIS — Z79899 Other long term (current) drug therapy: Secondary | ICD-10-CM | POA: Diagnosis not present

## 2020-05-19 DIAGNOSIS — I10 Essential (primary) hypertension: Secondary | ICD-10-CM | POA: Diagnosis not present

## 2020-05-19 DIAGNOSIS — Z6822 Body mass index (BMI) 22.0-22.9, adult: Secondary | ICD-10-CM | POA: Diagnosis not present

## 2020-05-19 DIAGNOSIS — M545 Low back pain, unspecified: Secondary | ICD-10-CM | POA: Diagnosis not present

## 2020-06-01 DIAGNOSIS — M545 Low back pain, unspecified: Secondary | ICD-10-CM | POA: Diagnosis not present

## 2020-06-01 DIAGNOSIS — Z6823 Body mass index (BMI) 23.0-23.9, adult: Secondary | ICD-10-CM | POA: Diagnosis not present

## 2020-06-01 DIAGNOSIS — M5430 Sciatica, unspecified side: Secondary | ICD-10-CM | POA: Diagnosis not present

## 2020-06-20 ENCOUNTER — Encounter (HOSPITAL_COMMUNITY): Payer: Self-pay | Admitting: Emergency Medicine

## 2020-06-20 ENCOUNTER — Emergency Department (HOSPITAL_COMMUNITY)
Admission: EM | Admit: 2020-06-20 | Discharge: 2020-06-20 | Disposition: A | Discharge status: Home / Self Care | Payer: Medicare Other | Attending: Emergency Medicine | Admitting: Emergency Medicine

## 2020-06-20 ENCOUNTER — Emergency Department (HOSPITAL_COMMUNITY): Payer: Medicare Other

## 2020-06-20 ENCOUNTER — Other Ambulatory Visit: Payer: Self-pay

## 2020-06-20 DIAGNOSIS — S82852A Displaced trimalleolar fracture of left lower leg, initial encounter for closed fracture: Secondary | ICD-10-CM | POA: Diagnosis not present

## 2020-06-20 DIAGNOSIS — I10 Essential (primary) hypertension: Secondary | ICD-10-CM | POA: Insufficient documentation

## 2020-06-20 DIAGNOSIS — M25572 Pain in left ankle and joints of left foot: Secondary | ICD-10-CM | POA: Diagnosis not present

## 2020-06-20 DIAGNOSIS — W010XXA Fall on same level from slipping, tripping and stumbling without subsequent striking against object, initial encounter: Secondary | ICD-10-CM | POA: Insufficient documentation

## 2020-06-20 DIAGNOSIS — Z79899 Other long term (current) drug therapy: Secondary | ICD-10-CM | POA: Diagnosis not present

## 2020-06-20 DIAGNOSIS — S99912A Unspecified injury of left ankle, initial encounter: Secondary | ICD-10-CM | POA: Diagnosis present

## 2020-06-20 DIAGNOSIS — M7989 Other specified soft tissue disorders: Secondary | ICD-10-CM | POA: Diagnosis not present

## 2020-06-20 HISTORY — DX: Disorder of thyroid, unspecified: E07.9

## 2020-06-20 HISTORY — DX: Essential (primary) hypertension: I10

## 2020-06-20 MED ORDER — IBUPROFEN 600 MG PO TABS: 600.0000 mg | ORAL_TABLET | Freq: Four times a day (QID) | ORAL | 0 refills | Status: DC | PRN

## 2020-06-20 MED ORDER — OXYCODONE HCL 5 MG PO TABS: 5.0000 mg | ORAL_TABLET | Freq: Four times a day (QID) | ORAL | 0 refills | Status: DC | PRN

## 2020-06-20 MED ORDER — ACETAMINOPHEN 325 MG PO TABS: 650.0000 mg | ORAL_TABLET | Freq: Four times a day (QID) | ORAL | 0 refills | Status: DC | PRN

## 2020-06-20 NOTE — ED Triage Notes (Signed)
Pt states she slipped in wet leaves approx 1 1/2 hours ago.  C/o pain to L ankle.

## 2020-06-20 NOTE — ED Notes (Signed)
Ortho tech paged regarding crutches & splint

## 2020-06-20 NOTE — Progress Notes (Signed)
Orthopedic Tech Progress Note Patient Details:  VALOREE AGENT 07-08-1945 [id number]  Ortho Devices Type of Ortho Device: Crutches,Post (short leg) splint,Stirrup splint Ortho Device/Splint Location: lle Ortho Device/Splint Interventions: Ordered,Application,Adjustment   Post Interventions Patient Tolerated: Well Instructions Provided: Care of device,Adjustment of device   Trinna Post 06/20/2020, 10:06 PM

## 2020-06-20 NOTE — Discharge Instructions (Signed)
Call the orthopedic office tomorrow to confirm an appointment with Dr. Jena Gauss in the office on Tuesday 06/22/20.  Tell them you were in the ER for a broken ankle, and Dr. Jena Gauss wanted to see you in the office this Tuesday.  At home, you should not put ANY weight on your left ankle.  Use your crutches when walking.  Try to put your foot up and elevate it as much as possible.  For pain you can take tylenol 650 mg every 6 hours and motrin 600 mg every 6 hours, as needed.  Take these with food whenever possible.  For severe pain I prescribe a few oxycodone tablets.  These will make you dizzy.  Use these as little as possible.

## 2020-06-20 NOTE — ED Notes (Signed)
Patient verbalizes understanding of discharge instructions. Opportunity for questioning and answers were provided. Armband removed by staff, pt discharged from ED stable & ambulatory utilizing crutches

## 2020-06-20 NOTE — ED Provider Notes (Addendum)
[facility] EMERGENCY DEPARTMENT Provider Note   CSN: [id number] Arrival date & time: 06/20/20  1515     History Chief Complaint  Patient presents with  . Ankle Pain    Deborah Kerr is a 74 y.o. female w/ presents with left ankle pain.  She slipped on wet leaves earlier today, rolled her ankle.  Now cannot bear weight on it.  Pain is minimal while at rest.  Reports paresthesias on medial aspect of foot but can wiggle toes and no anesthesia in foot.   No injury to head or remaining extremities.  Not on blood thinners.  HPI     Past Medical History:  Diagnosis Date  . Arthritis    rheumatoid arthritis  . Hypertension   . Thyroid disease     Patient Active Problem List   Diagnosis Date Noted  . Special screening for malignant neoplasms, colon   . Diverticulosis of colon without hemorrhage     Past Surgical History:  Procedure Laterality Date  . COLONOSCOPY    . COLONOSCOPY N/A 05/18/2015   Procedure: COLONOSCOPY;  Surgeon: Corbin Ade, MD;  Location: [facility]O SUITE;  Service: Endoscopy;  Laterality: N/A;  11:30  . OOPHORECTOMY    . Thyroidectomy       OB History   No obstetric history on file.     No family history on file.  Social History   Tobacco Use  . Smoking status: Never Smoker  . Smokeless tobacco: Never Used  Substance Use Topics  . Alcohol use: No  . Drug use: No    Home Medications Prior to Admission medications   Medication Sig Start Date End Date Taking? Authorizing Provider  acetaminophen (TYLENOL) 325 MG tablet Take 2 tablets (650 mg total) by mouth every 6 (six) hours as needed for up to 30 doses for mild pain or moderate pain. 06/20/20   Terald Sleeper, MD  hydroxychloroquine (PLAQUENIL) 200 MG tablet Take 200 mg by mouth daily.    [provider]  ibuprofen (ADVIL) 600 MG tablet Take 1 tablet (600 mg total) by mouth every 6 (six) hours as needed for up to 30 doses for mild pain or moderate pain.  06/20/20   Terald Sleeper, MD  ibuprofen (ADVIL,MOTRIN) 200 MG tablet Take 200 mg by mouth every 6 (six) hours as needed.    [provider]  levothyroxine (SYNTHROID, LEVOTHROID) 100 MCG tablet Take 100 mcg by mouth daily before breakfast.    [provider]  oxyCODONE (ROXICODONE) 5 MG immediate release tablet Take 1 tablet (5 mg total) by mouth every 6 (six) hours as needed for up to 10 doses for severe pain. 06/20/20   Terald Sleeper, MD  polyethylene glycol-electrolytes (TRILYTE) 420 G solution Take 4,000 mLs by mouth as directed. 04/26/15   Rourk, Gerrit Friends, MD    Allergies    Orencia [abatacept]  Review of Systems   Review of Systems  Constitutional: Negative for chills and fever.  Respiratory: Negative for cough and shortness of breath.   Cardiovascular: Negative for chest pain and palpitations.  Gastrointestinal: Negative for abdominal pain and vomiting.  Musculoskeletal: Positive for arthralgias and myalgias.  Skin: Negative for color change and rash.  Neurological: Negative for weakness and numbness.  Psychiatric/Behavioral: Negative for agitation and confusion.  All other systems reviewed and are negative.   Physical Exam Updated Vital Signs BP (!) 216/102 (BP Location: Right Arm)   Pulse 96   Temp 98  F (36.7 C) (Oral)   Resp 14   SpO2 99%   Physical Exam Vitals and nursing note reviewed.  Constitutional:      General: She is not in acute distress.    Appearance: She is well-developed and well-nourished.  HENT:     Head: Normocephalic and atraumatic.  Eyes:     Conjunctiva/sclera: Conjunctivae normal.  Cardiovascular:     Rate and Rhythm: Normal rate and regular rhythm.     Pulses: Normal pulses.  Pulmonary:     Effort: Pulmonary effort is normal. No respiratory distress.  Abdominal:     Palpations: Abdomen is soft.     Tenderness: There is no abdominal tenderness.  Musculoskeletal:        General: No edema.     Cervical back:  Neck supple.     Comments: Swelling of the left ankle  Skin:    General: Skin is warm and dry.  Neurological:     General: No focal deficit present.     Mental Status: She is alert and oriented to person, place, and time.     Sensory: No sensory deficit.  Psychiatric:        Mood and Affect: Mood and affect normal.     ED Results / Procedures / Treatments   Labs (all labs ordered are listed, but only abnormal results are displayed) Labs Reviewed - No data to display  EKG None  Radiology DG Ankle Complete Left  Result Date: 06/20/2020 CLINICAL DATA:  Left ankle injury this afternoon when the patient slipped and wet leaves. Initial encounter. EXAM: LEFT ANKLE COMPLETE - 3+ VIEW COMPARISON:  None. FINDINGS: The patient has acute trimalleolar fractures. The talar dome is laterally displaced off of the tibial plafond by approximately 0.8 cm. There is soft tissue swelling about the ankle. Calcaneal spurring noted. IMPRESSION: Acute trimalleolar fractures. Electronically Signed   By: Drusilla Kanner M.D.   On: 06/20/2020 16:16    Procedures Procedures (including critical care time)  Medications Ordered in ED Medications - No data to display  ED Course  I have reviewed the triage vital signs and the nursing notes.  Pertinent labs & imaging results that were available during my care of the patient were reviewed by me and considered in my medical decision making (see chart for details).  74 yo female here with left trimalleolar fracture of the ankle after a mechanical fall today  No open fx Neurovascularly intact  Patient not wanting pain meds - pain controlled while at rest  Clinical Course as of 06/20/20 2257  Sun Jun 20, 2020  2041 I spoke to Dr Jena Gauss from orthopedics.  Because there is no significant displacement of the fx, okay to splint and provide crutches, he can see her in the office on Tuesday to discuss surgery [MT]    Clinical Course User Index [MT] Reika Callanan,  Kermit Balo, MD     Final Clinical Impression(s) / ED Diagnoses Final diagnoses:  Closed trimalleolar fracture of left ankle, initial encounter    Rx / DC Orders ED Discharge Orders         Ordered    ibuprofen (ADVIL) 600 MG tablet  Every 6 hours PRN        06/20/20 2058    acetaminophen (TYLENOL) 325 MG tablet  Every 6 hours PRN        06/20/20 2058    oxyCODONE (ROXICODONE) 5 MG immediate release tablet  Every 6 hours PRN  06/20/20 4270           Terald Sleeper, MD 06/20/20 2132    Terald Sleeper, MD 06/20/20 2257

## 2020-06-22 ENCOUNTER — Ambulatory Visit: Payer: Self-pay | Admitting: Student

## 2020-06-22 DIAGNOSIS — S82852A Displaced trimalleolar fracture of left lower leg, initial encounter for closed fracture: Secondary | ICD-10-CM

## 2020-06-23 ENCOUNTER — Encounter (HOSPITAL_COMMUNITY): Payer: Self-pay | Admitting: Student

## 2020-06-23 ENCOUNTER — Other Ambulatory Visit: Payer: Self-pay

## 2020-06-23 ENCOUNTER — Other Ambulatory Visit (HOSPITAL_COMMUNITY)
Admission: RE | Admit: 2020-06-23 | Discharge: 2020-06-23 | Disposition: A | Discharge status: Home / Self Care | Payer: Medicare Other | Source: Ambulatory Visit | Attending: Student | Admitting: Student

## 2020-06-23 DIAGNOSIS — Z20822 Contact with and (suspected) exposure to covid-19: Secondary | ICD-10-CM | POA: Insufficient documentation

## 2020-06-23 DIAGNOSIS — Z01812 Encounter for preprocedural laboratory examination: Secondary | ICD-10-CM | POA: Insufficient documentation

## 2020-06-23 LAB — SARS CORONAVIRUS 2 (TAT 6-24 HRS): SARS Coronavirus 2: NEGATIVE

## 2020-06-23 NOTE — Progress Notes (Addendum)
PCP - Dr. Fara Chute Cardiologist - Denies  Chest x-ray -  EKG -  Stress Test - Denies ECHO - Denies Cardiac Cath - Denies  Sleep Study - Denies  DM - Denies  ERAS Protcol -Yes  COVID TEST- 06/23/20  Anesthesia review: No   [facility] [address], [place] - [address] [place] [address] Phone: [phone] Fax: [phone]      Your procedure is scheduled on Thursday December 23rd.  Report to [facility] Main Entrance "A" at 5:30 A.M., and check in at the Admitting office.  Call this number if you have problems the morning of surgery:  [phone]   Remember:  Do not eat after midnight the night before your surgery  You may drink clear liquids until 4:30am the morning of your surgery.   Clear liquids allowed are: Water, Non-Citrus Juices (without pulp), Carbonated Beverages, Clear Tea, Black Coffee Only, and Gatorade    Take these medicines the morning of surgery with A SIP OF WATER   levothyroxine (SYNTHROID) 112 MCG tablet  IF NEEDED  acetaminophen (TYLENOL) 325 MG tablet   oxyCODONE (ROXICODONE) 5 MG immediate release tablet  As of today, STOP taking any Aspirin (unless otherwise instructed by your surgeon) Aleve, Naproxen, Ibuprofen, Motrin, Advil, Goody's, BC's, all herbal medications, fish oil, and all vitamins.             Do not wear jewelry, make up, or nail polish            Do not wear lotions, powders, perfumes, or deodorant.            Do not shave 48 hours prior to surgery.              Do not bring valuables to the hospital.            [facility] is not responsible for any belongings or valuables.   Contacts, glasses, dentures or bridgework may not be worn into surgery.       Patients discharged the day of surgery will not be allowed to drive home, and someone needs to stay with them for 24 hours.  Day of Surgery: Wear Clean/Comfortable clothing the morning of surgery Do not apply any deodorants/lotions.   Remember to  brush your teeth WITH YOUR REGULAR TOOTHPASTE.   Please read over the following fact sheets that you were given.

## 2020-06-24 ENCOUNTER — Encounter (HOSPITAL_COMMUNITY): Payer: Self-pay | Admitting: Student

## 2020-06-24 ENCOUNTER — Ambulatory Visit (HOSPITAL_COMMUNITY): Payer: Medicare Other | Admitting: Certified Registered"

## 2020-06-24 ENCOUNTER — Ambulatory Visit (HOSPITAL_COMMUNITY): Payer: Medicare Other

## 2020-06-24 ENCOUNTER — Ambulatory Visit (HOSPITAL_COMMUNITY)
Admission: RE | Admit: 2020-06-24 | Discharge: 2020-06-24 | Disposition: A | Discharge status: Home / Self Care | Payer: Medicare Other | Source: Ambulatory Visit | Attending: Student | Admitting: Student

## 2020-06-24 ENCOUNTER — Other Ambulatory Visit: Payer: Self-pay

## 2020-06-24 ENCOUNTER — Encounter (HOSPITAL_COMMUNITY)
Admission: RE | Disposition: A | Discharge status: Home / Self Care | Payer: Self-pay | Source: Ambulatory Visit | Attending: Student

## 2020-06-24 DIAGNOSIS — Z419 Encounter for procedure for purposes other than remedying health state, unspecified: Secondary | ICD-10-CM

## 2020-06-24 DIAGNOSIS — S82852A Displaced trimalleolar fracture of left lower leg, initial encounter for closed fracture: Secondary | ICD-10-CM | POA: Diagnosis not present

## 2020-06-24 DIAGNOSIS — Z4789 Encounter for other orthopedic aftercare: Secondary | ICD-10-CM | POA: Diagnosis not present

## 2020-06-24 DIAGNOSIS — I10 Essential (primary) hypertension: Secondary | ICD-10-CM | POA: Diagnosis not present

## 2020-06-24 DIAGNOSIS — Z79899 Other long term (current) drug therapy: Secondary | ICD-10-CM | POA: Insufficient documentation

## 2020-06-24 DIAGNOSIS — X58XXXA Exposure to other specified factors, initial encounter: Secondary | ICD-10-CM | POA: Insufficient documentation

## 2020-06-24 DIAGNOSIS — S8292XD Unspecified fracture of left lower leg, subsequent encounter for closed fracture with routine healing: Secondary | ICD-10-CM | POA: Diagnosis not present

## 2020-06-24 DIAGNOSIS — Z9049 Acquired absence of other specified parts of digestive tract: Secondary | ICD-10-CM | POA: Insufficient documentation

## 2020-06-24 DIAGNOSIS — Z888 Allergy status to other drugs, medicaments and biological substances status: Secondary | ICD-10-CM | POA: Diagnosis not present

## 2020-06-24 DIAGNOSIS — S93432A Sprain of tibiofibular ligament of left ankle, initial encounter: Secondary | ICD-10-CM | POA: Diagnosis not present

## 2020-06-24 DIAGNOSIS — T148XXA Other injury of unspecified body region, initial encounter: Secondary | ICD-10-CM

## 2020-06-24 DIAGNOSIS — E039 Hypothyroidism, unspecified: Secondary | ICD-10-CM | POA: Diagnosis not present

## 2020-06-24 DIAGNOSIS — G8918 Other acute postprocedural pain: Secondary | ICD-10-CM | POA: Diagnosis not present

## 2020-06-24 HISTORY — PX: ORIF ANKLE FRACTURE: SHX5408

## 2020-06-24 LAB — CBC
HCT: 39.1 % (ref 36.0–46.0)
Hemoglobin: 12.3 g/dL (ref 12.0–15.0)
MCH: 29.9 pg (ref 26.0–34.0)
MCHC: 31.5 g/dL (ref 30.0–36.0)
MCV: 95.1 fL (ref 80.0–100.0)
Platelets: 205 10*3/uL (ref 150–400)
RBC: 4.11 MIL/uL (ref 3.87–5.11)
RDW: 13.7 % (ref 11.5–15.5)
WBC: 4.4 10*3/uL (ref 4.0–10.5)
nRBC: 0 % (ref 0.0–0.2)

## 2020-06-24 LAB — BASIC METABOLIC PANEL
Anion gap: 11 (ref 5–15)
BUN: 9 mg/dL (ref 8–23)
CO2: 25 mmol/L (ref 22–32)
Calcium: 7.4 mg/dL — ABNORMAL LOW (ref 8.9–10.3)
Chloride: 103 mmol/L (ref 98–111)
Creatinine, Ser: 0.71 mg/dL (ref 0.44–1.00)
GFR, Estimated: >60 mL/min (ref >60)
Glucose, Bld: 99 mg/dL (ref 70–99)
Potassium: 3 mmol/L — ABNORMAL LOW (ref 3.5–5.1)
Sodium: 139 mmol/L (ref 135–145)

## 2020-06-24 LAB — SURGICAL PCR SCREEN
MRSA, PCR: NEGATIVE
Staphylococcus aureus: NEGATIVE

## 2020-06-24 SURGERY — OPEN REDUCTION INTERNAL FIXATION (ORIF) ANKLE FRACTURE
Anesthesia: General | Site: Ankle | Laterality: Left

## 2020-06-24 MED ORDER — CEFAZOLIN SODIUM-DEXTROSE 2-4 GM/100ML-% IV SOLN
2.0000 g | INTRAVENOUS | Status: AC
  Administered 2020-06-24: 08:00:00 2 g via INTRAVENOUS
  Filled 2020-06-24: qty 100

## 2020-06-24 MED ORDER — DEXAMETHASONE SODIUM PHOSPHATE 10 MG/ML IJ SOLN
INTRAMUSCULAR | Status: DC | PRN
  Administered 2020-06-24: 5 mg

## 2020-06-24 MED ORDER — VANCOMYCIN HCL 1000 MG IV SOLR
INTRAVENOUS | Status: AC
  Filled 2020-06-24: qty 1000

## 2020-06-24 MED ORDER — BUPIVACAINE HCL (PF) 0.5 % IJ SOLN
INTRAMUSCULAR | Status: AC
  Filled 2020-06-24: qty 30

## 2020-06-24 MED ORDER — PROPOFOL 10 MG/ML IV BOLUS
INTRAVENOUS | Status: AC
  Filled 2020-06-24: qty 20

## 2020-06-24 MED ORDER — EPHEDRINE SULFATE-NACL 50-0.9 MG/10ML-% IV SOSY
PREFILLED_SYRINGE | INTRAVENOUS | Status: DC | PRN
  Administered 2020-06-24: 10 mg via INTRAVENOUS
  Administered 2020-06-24: 20 mg via INTRAVENOUS
  Administered 2020-06-24 (×5): 10 mg via INTRAVENOUS

## 2020-06-24 MED ORDER — FENTANYL CITRATE (PF) 250 MCG/5ML IJ SOLN
INTRAMUSCULAR | Status: AC
  Filled 2020-06-24: qty 5

## 2020-06-24 MED ORDER — LACTATED RINGERS IV SOLN: INTRAVENOUS | Status: DC

## 2020-06-24 MED ORDER — PROPOFOL 10 MG/ML IV BOLUS
INTRAVENOUS | Status: DC | PRN
  Administered 2020-06-24: 80 mg via INTRAVENOUS

## 2020-06-24 MED ORDER — GLYCOPYRROLATE 0.2 MG/ML IJ SOLN
INTRAMUSCULAR | Status: DC | PRN
  Administered 2020-06-24: .1 mg via INTRAVENOUS

## 2020-06-24 MED ORDER — LABETALOL HCL 5 MG/ML IV SOLN
INTRAVENOUS | Status: DC | PRN
  Administered 2020-06-24: 5 mg via INTRAVENOUS

## 2020-06-24 MED ORDER — FENTANYL CITRATE (PF) 100 MCG/2ML IJ SOLN
INTRAMUSCULAR | Status: DC | PRN
  Administered 2020-06-24: 50 ug via INTRAVENOUS

## 2020-06-24 MED ORDER — ROPIVACAINE HCL 5 MG/ML IJ SOLN
INTRAMUSCULAR | Status: DC | PRN
  Administered 2020-06-24: 25 mL via PERINEURAL
  Administered 2020-06-24: 15 mL via PERINEURAL

## 2020-06-24 MED ORDER — DEXAMETHASONE SODIUM PHOSPHATE 10 MG/ML IJ SOLN
INTRAMUSCULAR | Status: DC | PRN
  Administered 2020-06-24: 08:00:00 4 mg via INTRAVENOUS

## 2020-06-24 MED ORDER — MIDAZOLAM HCL 5 MG/5ML IJ SOLN
INTRAMUSCULAR | Status: DC | PRN
  Administered 2020-06-24: 1 mg via INTRAVENOUS

## 2020-06-24 MED ORDER — ONDANSETRON HCL 4 MG/2ML IJ SOLN
INTRAMUSCULAR | Status: DC | PRN
  Administered 2020-06-24: 4 mg via INTRAVENOUS

## 2020-06-24 MED ORDER — ASPIRIN EC 325 MG PO TBEC: 325.0000 mg | DELAYED_RELEASE_TABLET | Freq: Two times a day (BID) | ORAL | 0 refills | Status: AC

## 2020-06-24 MED ORDER — ORAL CARE MOUTH RINSE: 15.0000 mL | Freq: Once | OROMUCOSAL | Status: AC

## 2020-06-24 MED ORDER — 0.9 % SODIUM CHLORIDE (POUR BTL) OPTIME
TOPICAL | Status: DC | PRN
  Administered 2020-06-24: 08:00:00 1000 mL

## 2020-06-24 MED ORDER — CHLORHEXIDINE GLUCONATE 0.12 % MT SOLN
15.0000 mL | Freq: Once | OROMUCOSAL | Status: AC
  Administered 2020-06-24: 07:00:00 15 mL via OROMUCOSAL
  Filled 2020-06-24: qty 15

## 2020-06-24 MED ORDER — LIDOCAINE 2% (20 MG/ML) 5 ML SYRINGE
INTRAMUSCULAR | Status: DC | PRN
  Administered 2020-06-24: 20 mg via INTRAVENOUS

## 2020-06-24 MED ORDER — VANCOMYCIN HCL 1000 MG IV SOLR
INTRAVENOUS | Status: DC | PRN
  Administered 2020-06-24: 1000 mg

## 2020-06-24 MED ORDER — HYDROCODONE-ACETAMINOPHEN 5-325 MG PO TABS: 1.0000 | ORAL_TABLET | Freq: Four times a day (QID) | ORAL | 0 refills | Status: DC | PRN

## 2020-06-24 MED ORDER — MIDAZOLAM HCL 2 MG/2ML IJ SOLN
INTRAMUSCULAR | Status: AC
  Filled 2020-06-24: qty 2

## 2020-06-24 MED ORDER — LABETALOL HCL 5 MG/ML IV SOLN
INTRAVENOUS | Status: AC
  Filled 2020-06-24: qty 4

## 2020-06-24 SURGICAL SUPPLY — 79 items
APL PRP STRL LF DISP 70% ISPRP (MISCELLANEOUS) ×1
APL SKNCLS STERI-STRIP NONHPOA (GAUZE/BANDAGES/DRESSINGS) ×1
BANDAGE ESMARK 6X9 LF (GAUZE/BANDAGES/DRESSINGS) ×1 IMPLANT
BENZOIN TINCTURE PRP APPL 2/3 (GAUZE/BANDAGES/DRESSINGS) ×2 IMPLANT
BIT DRILL QC 2.0 SHORT EVOS SM (DRILL) IMPLANT
BIT DRILL QC 2.5MM SHRT EVO SM (DRILL) IMPLANT
BNDG CMPR 9X6 STRL LF SNTH (GAUZE/BANDAGES/DRESSINGS) ×1
BNDG CMPR MED 10X6 ELC LF (GAUZE/BANDAGES/DRESSINGS) ×1
BNDG COHESIVE 4X5 TAN STRL (GAUZE/BANDAGES/DRESSINGS) ×3 IMPLANT
BNDG ELASTIC 4X5.8 VLCR STR LF (GAUZE/BANDAGES/DRESSINGS) ×2 IMPLANT
BNDG ELASTIC 6X10 VLCR STRL LF (GAUZE/BANDAGES/DRESSINGS) ×2 IMPLANT
BNDG ELASTIC 6X5.8 VLCR STR LF (GAUZE/BANDAGES/DRESSINGS) ×2 IMPLANT
BNDG ESMARK 6X9 LF (GAUZE/BANDAGES/DRESSINGS) ×3
BRUSH SCRUB EZ PLAIN DRY (MISCELLANEOUS) ×6 IMPLANT
CHLORAPREP W/TINT 26 (MISCELLANEOUS) ×3 IMPLANT
CLOSURE STERI-STRIP 1/2X4 (GAUZE/BANDAGES/DRESSINGS) ×1
CLSR STERI-STRIP ANTIMIC 1/2X4 (GAUZE/BANDAGES/DRESSINGS) ×1 IMPLANT
COVER SURGICAL LIGHT HANDLE (MISCELLANEOUS) ×3 IMPLANT
COVER WAND RF STERILE (DRAPES) ×3 IMPLANT
DRAPE C-ARM 42X72 X-RAY (DRAPES) ×3 IMPLANT
DRAPE C-ARMOR (DRAPES) ×3 IMPLANT
DRAPE ORTHO SPLIT 77X108 STRL (DRAPES) ×6
DRAPE SURG ORHT 6 SPLT 77X108 (DRAPES) ×2 IMPLANT
DRAPE U-SHAPE 47X51 STRL (DRAPES) ×3 IMPLANT
DRILL QC 2.0 SHORT EVOS SM (DRILL) ×3
DRILL QC 2.5MM SHORT EVOS SM (DRILL) ×3
DRSG EMULSION OIL 3X3 NADH (GAUZE/BANDAGES/DRESSINGS) ×2 IMPLANT
ELECT REM PT RETURN 9FT ADLT (ELECTROSURGICAL) ×3
ELECTRODE REM PT RTRN 9FT ADLT (ELECTROSURGICAL) ×1 IMPLANT
GAUZE SPONGE 4X4 12PLY STRL (GAUZE/BANDAGES/DRESSINGS) ×2 IMPLANT
GLOVE BIO SURGEON STRL SZ 6.5 (GLOVE) ×6 IMPLANT
GLOVE BIO SURGEON STRL SZ7.5 (GLOVE) ×9 IMPLANT
GLOVE BIO SURGEONS STRL SZ 6.5 (GLOVE) ×3
GLOVE BIOGEL PI IND STRL 6.5 (GLOVE) ×1 IMPLANT
GLOVE BIOGEL PI IND STRL 7.5 (GLOVE) ×1 IMPLANT
GLOVE BIOGEL PI INDICATOR 6.5 (GLOVE) ×2
GLOVE BIOGEL PI INDICATOR 7.5 (GLOVE) ×2
GOWN STRL REUS W/ TWL LRG LVL3 (GOWN DISPOSABLE) ×2 IMPLANT
GOWN STRL REUS W/TWL LRG LVL3 (GOWN DISPOSABLE) ×6
K-WIRE 1.6 (WIRE) ×3
K-WIRE FX150X1.6XTROC PNT (WIRE) ×1
KIT INVISIKNOT ANKLE FRACTURE (Screw) ×2 IMPLANT
KIT TURNOVER KIT B (KITS) ×3 IMPLANT
KWIRE FX150X1.6XTROC PNT (WIRE) IMPLANT
MANIFOLD NEPTUNE II (INSTRUMENTS) ×3 IMPLANT
NDL HYPO 25GX1X1/2 BEV (NEEDLE) ×1 IMPLANT
NEEDLE HYPO 25GX1X1/2 BEV (NEEDLE) ×3 IMPLANT
NS IRRIG 1000ML POUR BTL (IV SOLUTION) ×3 IMPLANT
PACK TOTAL JOINT (CUSTOM PROCEDURE TRAY) ×3 IMPLANT
PAD ARMBOARD 7.5X6 YLW CONV (MISCELLANEOUS) ×6 IMPLANT
PAD CAST 4YDX4 CTTN HI CHSV (CAST SUPPLIES) IMPLANT
PADDING CAST COTTON 4X4 STRL (CAST SUPPLIES) ×3
PADDING CAST COTTON 6X4 STRL (CAST SUPPLIES) ×2 IMPLANT
PLATE L.DISTAL 2.7/3.5 7H EVOS (Plate) ×2 IMPLANT
SCREW CORT 2.7X12 EVOS (Screw) IMPLANT
SCREW CORT 2.7X14 T8 EVOS (Screw) ×4 IMPLANT
SCREW CORT 2.7X15 T8 ST EVOS (Screw) ×4 IMPLANT
SCREW CORT 2.7X17 ST T8 EVOS (Screw) ×2 IMPLANT
SCREW CORT 3.5X13MM (Screw) ×2 IMPLANT
SCREW CORT EVOS ST 3.5X12 (Screw) ×4 IMPLANT
SCREW CORT EVOS ST T8 2.7X14MM (Screw) ×3 IMPLANT
SCREW CORT ST EVOS 3.5X60 (Screw) ×2 IMPLANT
SCREW CORT ST EVOS 3.5X65 (Screw) ×2 IMPLANT
SCREW LOCK 2.7X13 ST EVOS (Screw) ×2 IMPLANT
STAPLER VISISTAT 35W (STAPLE) ×3 IMPLANT
SUCTION FRAZIER HANDLE 10FR (MISCELLANEOUS) ×3
SUCTION TUBE FRAZIER 10FR DISP (MISCELLANEOUS) ×1 IMPLANT
SUT ETHILON 3 0 PS 1 (SUTURE) ×6 IMPLANT
SUT MNCRL AB 3-0 PS2 18 (SUTURE) ×2 IMPLANT
SUT PROLENE 0 CT (SUTURE) ×2 IMPLANT
SUT VIC AB 0 CT1 27 (SUTURE) ×3
SUT VIC AB 0 CT1 27XBRD ANBCTR (SUTURE) ×1 IMPLANT
SUT VIC AB 2-0 CT1 27 (SUTURE) ×9
SUT VIC AB 2-0 CT1 TAPERPNT 27 (SUTURE) ×2 IMPLANT
SYR CONTROL 10ML LL (SYRINGE) ×3 IMPLANT
TOWEL GREEN STERILE (TOWEL DISPOSABLE) ×6 IMPLANT
TOWEL GREEN STERILE FF (TOWEL DISPOSABLE) ×3 IMPLANT
UNDERPAD 30X36 HEAVY ABSORB (UNDERPADS AND DIAPERS) ×3 IMPLANT
WATER STERILE IRR 1000ML POUR (IV SOLUTION) ×3 IMPLANT

## 2020-06-24 NOTE — Progress Notes (Signed)
Orthopedic Tech Progress Note Patient Details:  Deborah Kerr August 20, 1945 [id number]  Ortho Devices Type of Ortho Device: CAM walker Ortho Device/Splint Location: LLE Ortho Device/Splint Interventions: Application,Ordered   Post Interventions Patient Tolerated: Well   [name] 06/24/2020, 9:13 AM

## 2020-06-24 NOTE — Discharge Instructions (Addendum)
Orthopaedic Trauma Service Discharge Instructions   General Discharge Instructions  WEIGHT BEARING STATUS: Non-weightbearing left lower extremity  RANGE OF MOTION/ACTIVITY: Ok to come out of CAM boot for gentle ankle motion  Wound Care: Ok to remove surgical dressing on post-op day #2 (Saturday 06/26/20). Incisions can be left open to air if there is no drainage. If incision continues to have drainage, follow wound care instructions below. Okay to shower if no drainage from incisions.  DVT/PE prophylaxis: Aspirin 325 mg twice daily  Diet: as you were eating previously.  Can use over the counter stool softeners and bowel preparations, such as Miralax, to help with bowel movements.  Narcotics can be constipating.  Be sure to drink plenty of fluids  PAIN MEDICATION USE AND EXPECTATIONS  You have likely been given narcotic medications to help control your pain.  After a traumatic event that results in an fracture (broken bone) with or without surgery, it is ok to use narcotic pain medications to help control one's pain.  We understand that everyone responds to pain differently and each individual patient will be evaluated on a regular basis for the continued need for narcotic medications. Ideally, narcotic medication use should last no more than 6-8 weeks (coinciding with fracture healing).   As a patient it is your responsibility as well to monitor narcotic medication use and report the amount and frequency you use these medications when you come to your office visit.   We would also advise that if you are using narcotic medications, you should take a dose prior to therapy to maximize you participation.  IF YOU ARE ON NARCOTIC MEDICATIONS IT IS NOT PERMISSIBLE TO OPERATE A MOTOR VEHICLE (MOTORCYCLE/CAR/TRUCK/MOPED) OR HEAVY MACHINERY DO NOT MIX NARCOTICS WITH OTHER CNS (CENTRAL NERVOUS SYSTEM) DEPRESSANTS SUCH AS ALCOHOL   STOP SMOKING OR USING NICOTINE PRODUCTS!!!!  As discussed nicotine  severely impairs your body's ability to heal surgical and traumatic wounds but also impairs bone healing.  Wounds and bone heal by forming microscopic blood vessels (angiogenesis) and nicotine is a vasoconstrictor (essentially, shrinks blood vessels).  Therefore, if vasoconstriction occurs to these microscopic blood vessels they essentially disappear and are unable to deliver necessary nutrients to the healing tissue.  This is one modifiable factor that you can do to dramatically increase your chances of healing your injury.    (This means no smoking, no nicotine gum, patches, etc)  DO NOT USE NONSTEROIDAL ANTI-INFLAMMATORY DRUGS (NSAID'S)  Using products such as Advil (ibuprofen), Aleve (naproxen), Motrin (ibuprofen) for additional pain control during fracture healing can delay and/or prevent the healing response.  If you would like to take over the counter (OTC) medication, Tylenol (acetaminophen) is ok.  However, some narcotic medications that are given for pain control contain acetaminophen as well. Therefore, you should not exceed more than 4000 mg of tylenol in a day if you do not have liver disease.  Also note that there are may OTC medicines, such as cold medicines and allergy medicines that my contain tylenol as well.  If you have any questions about medications and/or interactions please ask your doctor/PA or your pharmacist.      ICE AND ELEVATE INJURED/OPERATIVE EXTREMITY  Using ice and elevating the injured extremity above your heart can help with swelling and pain control.  Icing in a pulsatile fashion, such as 20 minutes on and 20 minutes off, can be followed.    Do not place ice directly on skin. Make sure there is a barrier between to  skin and the ice pack.    Using frozen items such as frozen peas works well as the conform nicely to the are that needs to be iced.  USE AN ACE WRAP OR TED HOSE FOR SWELLING CONTROL  In addition to icing and elevation, Ace wraps or TED hose are used to  help limit and resolve swelling.  It is recommended to use Ace wraps or TED hose until you are informed to stop.    When using Ace Wraps start the wrapping distally (farthest away from the body) and wrap proximally (closer to the body)   Example: If you had surgery on your leg or thing and you do not have a splint on, start the ace wrap at the toes and work your way up to the thigh        If you had surgery on your upper extremity and do not have a splint on, start the ace wrap at your fingers and work your way up to the upper arm   IF YOU ARE IN A CAM BOOT (BLACK BOOT)  You may remove boot periodically. Perform daily dressing changes as noted below.  Wash the liner of the boot regularly and wear a sock when wearing the boot. It is recommended that you sleep in the boot until told otherwise   CALL THE OFFICE WITH ANY QUESTIONS OR CONCERNS: [phone]   VISIT OUR WEBSITE FOR ADDITIONAL INFORMATION: [email or web address]    Discharge Wound Care Instructions  Do NOT apply any ointments, solutions or lotions to pin sites or surgical wounds.  These prevent needed drainage and even though solutions like hydrogen peroxide kill bacteria, they also damage cells lining the pin sites that help fight infection.  Applying lotions or ointments can keep the wounds moist and can cause them to breakdown and open up as well. This can increase the risk for infection. When in doubt call the office.  Surgical incisions should be dressed daily.  If any drainage is noted, use one layer of adaptic, then gauze, Kerlix, and an ace wrap.  Once the incision is completely dry and without drainage, it may be left open to air out.  Showering may begin 36-48 hours later.  Cleaning gently with soap and water.  Traumatic wounds should be dressed daily as well.    One layer of adaptic, gauze, Kerlix, then ace wrap.  The adaptic can be discontinued once the draining has ceased    If you have a wet to dry dressing: wet the  gauze with saline the squeeze as much saline out so the gauze is moist (not soaking wet), place moistened gauze over wound, then place a dry gauze over the moist one, followed by Kerlix wrap, then ace wrap.

## 2020-06-24 NOTE — Transfer of Care (Signed)
Immediate Anesthesia Transfer of Care Note  Patient: Deborah Kerr  Procedure(s) Performed: OPEN REDUCTION INTERNAL FIXATION (ORIF) ANKLE FRACTURE (Left Ankle)  Patient Location: PACU  Anesthesia Type:General and Regional  Level of Consciousness: awake, alert  and oriented  Airway & Oxygen Therapy: Patient Spontanous Breathing  Post-op Assessment: Report given to RN and Post -op Vital signs reviewed and stable  Post vital signs: Reviewed and stable  Last Vitals:  Vitals Value Taken Time  BP 170/93 06/24/20 0902  Temp    Pulse 82 06/24/20 0906  Resp 14 06/24/20 0906  SpO2 97 % 06/24/20 0906  Vitals shown include unvalidated device data.  Last Pain:  Vitals:   06/24/20 0900  TempSrc:   PainSc: Asleep         Complications: No complications documented.

## 2020-06-24 NOTE — Op Note (Signed)
Orthopaedic Surgery Operative Note (CSN: [id number] ) Date of Surgery: 06/24/2020  Admit Date: 06/24/2020   Diagnoses: Pre-Op Diagnoses: Left closed trimalleolar ankle fracture  Post-Op Diagnosis: Same  Procedures: 1. CPT 99242-ASTM reduction internal fixation of left trimalleolar ankle fracture 2. CPT 27829-Open reduction internal fixation of left syndesmosis   Surgeons : Primary: Beonka Amesquita, Thomasene Lot, MD  Assistant: Patrecia Pace, PA-C  Location: OR 3   Anesthesia:General with regional anesthesia  Antibiotics: Ancef 2g preop with 1 gm vancomycin powder placed topically   Tourniquet time:None    Estimated Blood HDQQ:22 mL  Complications:None   Specimens:None   Implants: Implant Name Type Inv. Item Serial No. Manufacturer Lot No. LRB No. Used Action  PLATE L.DISTAL 2.7/3.5 7H EVOS - [id number] Plate PLATE L.DISTAL 2.7/3.5 7H EVOS  [employer]  Left 1 Implanted  SCREW CORT EVOS ST 3.5X12 - [id number] Screw SCREW CORT EVOS ST 3.5X12  [employer]  Left 2 Implanted  SCREW CORT 2.7X17 ST T8 EVOS - [id number] Screw SCREW CORT 2.7X17 ST T8 EVOS  [employer]  Left 1 Implanted  SCREW CORT 3.5X13MM - [id number] Screw SCREW CORT 3.5X13MM  [employer]  Left 1 Implanted  SCREW CORT 2.7X15 T8 ST EVOS - [id number] Screw SCREW CORT 2.7X15 T8 ST EVOS  [employer]  Left 2 Implanted  SCREW LOCK 2.7X13 ST EVOS - [id number] Screw SCREW LOCK 2.7X13 ST EVOS  [employer]  Left 1 Implanted  SCREW CORT 2.7X14 T8 EVOS - [id number] Screw SCREW CORT 2.7X14 T8 EVOS  [employer]  Left 2 Implanted  SCREW CORT EVOS ST T8 2.7X14MM - [id number] Screw SCREW CORT EVOS ST T8 2.7X14MM  [employer]  Left 1 Implanted  SCREW CORT ST EVOS 3.5X65 - [id number] Screw SCREW CORT ST EVOS 3.5X65  [employer]  Left 1 Implanted  SCREW CORT ST EVOS 3.5X60 - [id number] Screw SCREW CORT ST  EVOS 3.5X60  [employer]  Left 1 Implanted  KIT INVISIKNOT ANKLE FRACTURE - [id number] Screw KIT INVISIKNOT ANKLE FRACTURE  [employer] [id number] Left 1 Implanted     Indications for Surgery: 74 year old female who sustained a left trimalleolar ankle fracture.  Due to the unstable nature of her injury I recommended proceeding with open reduction internal fixation.  Risks and benefits were discussed with the patient.  Risks include but not limited to bleeding, infection, malunion, nonunion, hardware failure, hardware irritation, nerve and blood vessel injury, posttraumatic arthritis, ankle stiffness, need for hardware removal, DVT, even the possibility anesthetic complications.  The patient agreed to proceed with surgery and consent was obtained.  Operative Findings: 1.  Open reduction internal fixation of left trimalleolar ankle fracture using [employer] EVOS 3.5/2.7 mm distal fibular locking plate with independent at 3.5 mm medial malleolus screws 2.  External rotation stress view with [place] medial clear space widening treated with placement of Smith [employer] Nephew Invisiknot suture fixation.  Procedure: The patient was identified in the preoperative holding area. Consent was confirmed with the patient and their family and all questions were answered. The operative extremity was marked after confirmation with the patient. she was then brought back to the operating room by our anesthesia colleagues.  She was placed under general anesthetic carefully transferred over to a radiolucent flat top table.  A bump was placed under her operative hip.  The left lower extremity was then prepped and draped  in usual sterile fashion.  A timeout was performed to verify the patient, the procedure, and the extremity.  Preoperative antibiotics were dosed.  Fluoroscopic imaging was obtained to show the unstable nature of her injury.  A direct lateral approach the distal fibula was carried down  through skin and subcutaneous tissue.  I took care to protect the branches of the superficial peroneal nerve.  I then perform subperiosteal dissection to expose the fracture.  I used a reduction tenaculum to reduce the fracture.  I confirmed adequate reduction with fluoroscopy.  I then chose a 7 hole [employer]hew EVOS 3.5 mm / 2.7 mm distal fibular locking plate.  I held it provisionally with a K wire.  I then drilled and placed on nonlocking screws into the fibular shaft proximal to the fracture.  I then placed a another nonlocking screw into the fibular shaft.  I returned to the distal segment and placed a 2.7 mm nonlocking screw to bring the plate flush to bone.  I confirmed adequate plate placement with fluoroscopy.  I then proceeded to place locking screws into the distal segment.  I placed another nonlocking screw into the proximal fibular shaft.  A curvilinear incision was then made over the medial malleolus and carried down through skin and subcutaneous tissue.  I took care to protect the neurovascular bundle.  I then exposed the fracture used a reduction tenaculum to hold the fracture anatomically and then proceeded to hold it provisionally with a 1.6 mm K wire.  I then drilled and placed a 3.5 mm bicortical screws gaining excellent purchase.  The clamp and K wire were removed.  Fluoroscopic imaging was obtained.  A external rotation stress view was performed which showed a medial clear space widening.  At this point I felt that suture fixation of the syndesmosis was appropriate.  With the ankle in full dorsiflexion I used the Invisiknot drill to drill through the fibula and tibia coming out medially.  I then passed the suture button through the to the tibia and brought it out the medial side.  I then set it flush to the bone and proceeded to tighten the suture fixation.  I then tied over the the plate to reinforce the fixation.  Final fluoroscopic imaging was obtained.  The incision was copiously  irrigated.  A gram of vancomycin powder was placed into the incision.  A layer closure of 2-0 Vicryl and 3-0 nylon was used to close the skin.  Sterile dressings were placed.  A boot was applied to her lower extremity.  The patient was then awoken from anesthesia and taken to the PACU in stable condition.  Post Op Plan/Instructions: Patient will be nonweightbearing to left lower extremity.  She will receive aspirin for DVT prophylaxis.  She will be discharged home from the PACU.  We will plan to have her return in 2 weeks for x-rays and suture removal.  I was present and performed the entire surgery.  Patrecia Pace, PA-C did assist me throughout the case. An assistant was necessary given the difficulty in approach, maintenance of reduction and ability to instrument the fracture.   Katha Hamming, MD [facility]

## 2020-06-24 NOTE — Anesthesia Postprocedure Evaluation (Signed)
Anesthesia Post Note  Patient: ROSIO WEISS  Procedure(s) Performed: OPEN REDUCTION INTERNAL FIXATION (ORIF) ANKLE FRACTURE (Left Ankle)     Patient location during evaluation: PACU Anesthesia Type: General Level of consciousness: awake and alert Pain management: pain level controlled Vital Signs Assessment: post-procedure vital signs reviewed and stable Respiratory status: spontaneous breathing, nonlabored ventilation and respiratory function stable Cardiovascular status: blood pressure returned to baseline and stable Postop Assessment: no apparent nausea or vomiting Anesthetic complications: no   No complications documented.  Last Vitals:  Vitals:   06/24/20 0915 06/24/20 0930  BP: (!) 177/96 (!) 175/98  Pulse: 81 73  Resp: 19 17  Temp:    SpO2: 99% 99%    Last Pain:  Vitals:   06/24/20 0930  TempSrc:   PainSc: 0-No pain    LLE Motor Response: No movement due to regional block (06/24/20 0930) LLE Sensation: Decreased (block) (06/24/20 0930)          Lucretia Kern

## 2020-06-24 NOTE — H&P (Signed)
Orthopaedic Trauma Service (OTS) Consult   Patient ID: Deborah Kerr MRN: [id number] DOB/AGE: [birth year] 74 y.o.  Reason for Surgery: Left ankle fracture  HPI: Deborah Kerr is an 74 y.o. female who presents for fixation of her left ankle. She slipped on wet leaves on 12/19. She sustained a left trimalleolar ankle fracture. She was splinted in ER and followed up in my office. She presents for surgery on her ankle. She is ambulatory without assist device. No recent illness. Has a history of RA and is on blood pressure medications.  Past Medical History:  Diagnosis Date  . Arthritis    rheumatoid arthritis  . Hypertension   . Hypothyroidism 2009  . Thyroid disease     Past Surgical History:  Procedure Laterality Date  . APPENDECTOMY     per patient 1970s  . COLONOSCOPY    . COLONOSCOPY N/A 05/18/2015   Procedure: COLONOSCOPY;  Surgeon: Corbin Ade, MD;  Location: [facility];  Service: Endoscopy;  Laterality: N/A;  11:30  . EYE SURGERY Bilateral 2021   cataract surgery  . OOPHORECTOMY    . Thyroidectomy    . TUBAL LIGATION      History reviewed. No pertinent family history.  Social History:  reports that she has never smoked. She has never used smokeless tobacco. She reports that she does not drink alcohol and does not use drugs.  Allergies:  Allergies  Allergen Reactions  . Orencia [Abatacept]     Coughing, palm of hands itch     Medications:  No current facility-administered medications on file prior to encounter.   Current Outpatient Medications on File Prior to Encounter  Medication Sig Dispense Refill  . acetaminophen (TYLENOL) 325 MG tablet Take 2 tablets (650 mg total) by mouth every 6 (six) hours as needed for up to 30 doses for mild pain or moderate pain. 30 tablet 0  . hydroxychloroquine (PLAQUENIL) 200 MG tablet Take 300 mg by mouth daily.    Marland Kitchen leflunomide (ARAVA) 20 MG tablet Take 20 mg by mouth daily.    Marland Kitchen levothyroxine (SYNTHROID) 112 MCG tablet  Take 112 mcg by mouth daily before breakfast.    . losartan (COZAAR) 50 MG tablet Take 50 mg by mouth daily.    Marland Kitchen ibuprofen (ADVIL) 600 MG tablet Take 1 tablet (600 mg total) by mouth every 6 (six) hours as needed for up to 30 doses for mild pain or moderate pain. (Patient not taking: No sig reported) 30 tablet 0  . ibuprofen (ADVIL,MOTRIN) 200 MG tablet Take 600-800 mg by mouth every 8 (eight) hours as needed for moderate pain.    Marland Kitchen oxyCODONE (ROXICODONE) 5 MG immediate release tablet Take 1 tablet (5 mg total) by mouth every 6 (six) hours as needed for up to 10 doses for severe pain. (Patient not taking: No sig reported) 10 tablet 0    ROS: Constitutional: No fever or chills Vision: No changes in vision ENT: No difficulty swallowing CV: No chest pain Pulm: No SOB or wheezing GI: No nausea or vomiting GU: No urgency or inability to hold urine Skin: No poor wound healing Neurologic: No numbness or tingling Psychiatric: No depression or anxiety Heme: No bruising Allergic: No reaction to medications or food   Exam: Blood pressure (!) 208/98, pulse 92, temperature 98 F (36.7 C), temperature source Oral, resp. rate 17, height 5\' 3"  (1.6 m), weight 59 kg, SpO2 99 %. General:NAD Orientation:AAOx3 Mood and Affect: Cooperative and pleasant Gait: Unable to assess  due to fracture Coordination and balance: Within normal limits  LLE: swelling and bruising in place. Active motor function to foot. Sensation intact. Warm  and well perfused foot.  RLE: Skin without lesions. No tenderness to palpation. Full painless ROM, full strength in each muscle groups without evidence of instability.   Medical Decision Making: Data: Imaging: X-rays show displaced left trimalleolar ankle fracture  Labs:  Results for orders placed or performed during the hospital encounter of 06/24/20 (from the past 24 hour(s))  Basic metabolic panel per protocol     Status: Abnormal   Collection Time: 06/24/20  5:44 AM   Result Value Ref Range   Sodium 139 135 - 145 mmol/L   Potassium 3.0 (L) 3.5 - 5.1 mmol/L   Chloride 103 98 - 111 mmol/L   CO2 25 22 - 32 mmol/L   Glucose, Bld 99 70 - 99 mg/dL   BUN 9 8 - 23 mg/dL   Creatinine, Ser 2.53 0.44 - 1.00 mg/dL   Calcium 7.4 (L) 8.9 - 10.3 mg/dL   GFR, Estimated >66 >44 mL/min   Anion gap 11 5 - 15  CBC per protocol     Status: None   Collection Time: 06/24/20  5:44 AM  Result Value Ref Range   WBC 4.4 4.0 - 10.5 K/uL   RBC 4.11 3.87 - 5.11 MIL/uL   Hemoglobin 12.3 12.0 - 15.0 g/dL   HCT 03.4 74.2 - 59.5 %   MCV 95.1 80.0 - 100.0 fL   MCH 29.9 26.0 - 34.0 pg   MCHC 31.5 30.0 - 36.0 g/dL   RDW 63.8 75.6 - 43.3 %   Platelets 205 150 - 400 K/uL   nRBC 0.0 0.0 - 0.2 %   Medical history and chart was reviewed and case discussed with medical provider.  Assessment/Plan: 74 year old female with left ankle fracture  Will recommend ORIF of left ankle. Risks and benefits discussed and the patient agreed to proceed. Consent was obtained.  Roby Lofts, MD [facility] [phone] (office) [email or web address]

## 2020-06-24 NOTE — Anesthesia Preprocedure Evaluation (Addendum)
Anesthesia Evaluation  Patient identified by MRN, date of birth, ID band Patient awake    Reviewed: Allergy & Precautions, NPO status , Patient's Chart, lab work & pertinent test results  History of Anesthesia Complications Negative for: history of anesthetic complications  Airway Mallampati: II  TM Distance: >3 FB Neck ROM: Full    Dental  (+) Teeth Intact   Pulmonary neg pulmonary ROS,    Pulmonary exam normal        Cardiovascular Exercise Tolerance: Good hypertension, Pt. on medications (-) angina(-) DOE Normal cardiovascular exam     Neuro/Psych negative neurological ROS  negative psych ROS   GI/Hepatic negative GI ROS, Neg liver ROS,   Endo/Other  Hypothyroidism   Renal/GU negative Renal ROS  negative genitourinary   Musculoskeletal  (+) Arthritis , Rheumatoid disorders,    Abdominal   Peds  Hematology negative hematology ROS (+)   Anesthesia Other Findings   Reproductive/Obstetrics                           Anesthesia Physical Anesthesia Plan  ASA: II  Anesthesia Plan: General   Post-op Pain Management: GA combined w/ Regional for post-op pain   Induction: Intravenous  PONV Risk Score and Plan: 3 and Ondansetron, Dexamethasone, Midazolam and Treatment may vary due to age or medical condition  Airway Management Planned: LMA  Additional Equipment: None  Intra-op Plan:   Post-operative Plan: Extubation in OR  Informed Consent: I have reviewed the patients History and Physical, chart, labs and discussed the procedure including the risks, benefits and alternatives for the proposed anesthesia with the patient or authorized representative who has indicated his/her understanding and acceptance.     Dental advisory given  Plan Discussed with:   Anesthesia Plan Comments:         Anesthesia Quick Evaluation

## 2020-06-24 NOTE — Anesthesia Procedure Notes (Signed)
Anesthesia Regional Block: Adductor canal block   Pre-Anesthetic Checklist: ,, timeout performed, Correct Patient, Correct Site, Correct Laterality, Correct Procedure, Correct Position, site marked, Risks and benefits discussed,  Surgical consent,  Pre-op evaluation,  At surgeon's request and post-op pain management  Laterality: Left  Prep: chloraprep       Needles:  Injection technique: Single-shot  Needle Type: Echogenic Stimulator Needle     Needle Length: 10cm  Needle Gauge: 20     Additional Needles:   Procedures:,,,, ultrasound used (permanent image in chart),,,,  Narrative:  Start time: 06/24/2020 7:15 AM End time: 06/24/2020 7:18 AM Injection made incrementally with aspirations every 5 mL.  Performed by: Personally  Anesthesiologist: Lucretia Kern, MD  Additional Notes: Standard monitors applied. Skin prepped. Good needle visualization with ultrasound. Injection made in 5cc increments with no resistance to injection. Patient tolerated the procedure well.

## 2020-06-24 NOTE — Anesthesia Procedure Notes (Signed)
Anesthesia Regional Block: Popliteal block   Pre-Anesthetic Checklist: ,, timeout performed, Correct Patient, Correct Site, Correct Laterality, Correct Procedure, Correct Position, site marked, Risks and benefits discussed,  Surgical consent,  Pre-op evaluation,  At surgeon's request and post-op pain management  Laterality: Left  Prep: chloraprep       Needles:  Injection technique: Single-shot  Needle Type: Echogenic Stimulator Needle     Needle Length: 10cm  Needle Gauge: 20     Additional Needles:   Procedures:,,,, ultrasound used (permanent image in chart),,,,  Narrative:  Start time: 06/24/2020 7:10 AM End time: 06/24/2020 7:15 AM  Performed by: Personally  Anesthesiologist: Lucretia Kern, MD  Additional Notes: Standard monitors applied. Skin prepped. Good needle visualization with ultrasound. Injection made in 5cc increments with no resistance to injection. Patient tolerated the procedure well.

## 2020-06-24 NOTE — Anesthesia Procedure Notes (Signed)
Procedure Name: LMA Insertion Date/Time: 06/24/2020 7:44 AM Performed by: Marny Lowenstein, CRNA Pre-anesthesia Checklist: Patient identified, Emergency Drugs available, Suction available and Patient being monitored Patient Re-evaluated:Patient Re-evaluated prior to induction Oxygen Delivery Method: Circle system utilized Preoxygenation: Pre-oxygenation with 100% oxygen Induction Type: IV induction Ventilation: Mask ventilation without difficulty LMA: LMA inserted LMA Size: 3.0 Number of attempts: 1 Placement Confirmation: positive ETCO2 and breath sounds checked- equal and bilateral Tube secured with: Tape Dental Injury: Teeth and Oropharynx as per pre-operative assessment

## 2020-06-25 DIAGNOSIS — I10 Essential (primary) hypertension: Secondary | ICD-10-CM | POA: Diagnosis not present

## 2020-06-25 DIAGNOSIS — S82899A Other fracture of unspecified lower leg, initial encounter for closed fracture: Secondary | ICD-10-CM | POA: Diagnosis not present

## 2020-06-28 ENCOUNTER — Encounter (HOSPITAL_COMMUNITY): Payer: Self-pay | Admitting: Student

## 2020-07-02 DIAGNOSIS — I10 Essential (primary) hypertension: Secondary | ICD-10-CM | POA: Diagnosis not present

## 2020-07-02 DIAGNOSIS — E039 Hypothyroidism, unspecified: Secondary | ICD-10-CM | POA: Diagnosis not present

## 2020-07-02 DIAGNOSIS — E7849 Other hyperlipidemia: Secondary | ICD-10-CM | POA: Diagnosis not present

## 2020-07-06 DIAGNOSIS — S82852D Displaced trimalleolar fracture of left lower leg, subsequent encounter for closed fracture with routine healing: Secondary | ICD-10-CM | POA: Diagnosis not present

## 2020-07-15 DIAGNOSIS — I10 Essential (primary) hypertension: Secondary | ICD-10-CM | POA: Diagnosis not present

## 2020-07-22 DIAGNOSIS — I10 Essential (primary) hypertension: Secondary | ICD-10-CM | POA: Diagnosis not present

## 2020-07-22 DIAGNOSIS — E7849 Other hyperlipidemia: Secondary | ICD-10-CM | POA: Diagnosis not present

## 2020-07-22 DIAGNOSIS — E782 Mixed hyperlipidemia: Secondary | ICD-10-CM | POA: Diagnosis not present

## 2020-07-22 DIAGNOSIS — E039 Hypothyroidism, unspecified: Secondary | ICD-10-CM | POA: Diagnosis not present

## 2020-07-27 DIAGNOSIS — E039 Hypothyroidism, unspecified: Secondary | ICD-10-CM | POA: Diagnosis not present

## 2020-07-27 DIAGNOSIS — M0579 Rheumatoid arthritis with rheumatoid factor of multiple sites without organ or systems involvement: Secondary | ICD-10-CM | POA: Diagnosis not present

## 2020-07-27 DIAGNOSIS — E7849 Other hyperlipidemia: Secondary | ICD-10-CM | POA: Diagnosis not present

## 2020-07-27 DIAGNOSIS — D709 Neutropenia, unspecified: Secondary | ICD-10-CM | POA: Diagnosis not present

## 2020-07-27 DIAGNOSIS — I10 Essential (primary) hypertension: Secondary | ICD-10-CM | POA: Diagnosis not present

## 2020-08-02 DIAGNOSIS — S82852D Displaced trimalleolar fracture of left lower leg, subsequent encounter for closed fracture with routine healing: Secondary | ICD-10-CM | POA: Diagnosis not present

## 2020-08-02 DIAGNOSIS — S93432D Sprain of tibiofibular ligament of left ankle, subsequent encounter: Secondary | ICD-10-CM | POA: Diagnosis not present

## 2020-08-11 DIAGNOSIS — M25572 Pain in left ankle and joints of left foot: Secondary | ICD-10-CM | POA: Diagnosis not present

## 2020-08-17 DIAGNOSIS — M25572 Pain in left ankle and joints of left foot: Secondary | ICD-10-CM | POA: Diagnosis not present

## 2020-08-19 DIAGNOSIS — M25572 Pain in left ankle and joints of left foot: Secondary | ICD-10-CM | POA: Diagnosis not present

## 2020-08-24 DIAGNOSIS — M25572 Pain in left ankle and joints of left foot: Secondary | ICD-10-CM | POA: Diagnosis not present

## 2020-08-26 DIAGNOSIS — M25572 Pain in left ankle and joints of left foot: Secondary | ICD-10-CM | POA: Diagnosis not present

## 2020-08-30 DIAGNOSIS — M25572 Pain in left ankle and joints of left foot: Secondary | ICD-10-CM | POA: Diagnosis not present

## 2020-08-31 DIAGNOSIS — S93432D Sprain of tibiofibular ligament of left ankle, subsequent encounter: Secondary | ICD-10-CM | POA: Diagnosis not present

## 2020-09-01 DIAGNOSIS — Z961 Presence of intraocular lens: Secondary | ICD-10-CM | POA: Diagnosis not present

## 2020-09-01 DIAGNOSIS — H02834 Dermatochalasis of left upper eyelid: Secondary | ICD-10-CM | POA: Diagnosis not present

## 2020-09-01 DIAGNOSIS — H02831 Dermatochalasis of right upper eyelid: Secondary | ICD-10-CM | POA: Diagnosis not present

## 2020-09-01 DIAGNOSIS — Z79899 Other long term (current) drug therapy: Secondary | ICD-10-CM | POA: Diagnosis not present

## 2020-09-01 DIAGNOSIS — M0689 Other specified rheumatoid arthritis, multiple sites: Secondary | ICD-10-CM | POA: Diagnosis not present

## 2020-09-02 DIAGNOSIS — M25572 Pain in left ankle and joints of left foot: Secondary | ICD-10-CM | POA: Diagnosis not present

## 2020-09-07 DIAGNOSIS — M25572 Pain in left ankle and joints of left foot: Secondary | ICD-10-CM | POA: Diagnosis not present

## 2020-09-08 DIAGNOSIS — M545 Low back pain, unspecified: Secondary | ICD-10-CM | POA: Diagnosis not present

## 2020-09-08 DIAGNOSIS — Z6822 Body mass index (BMI) 22.0-22.9, adult: Secondary | ICD-10-CM | POA: Diagnosis not present

## 2020-09-09 DIAGNOSIS — M25572 Pain in left ankle and joints of left foot: Secondary | ICD-10-CM | POA: Diagnosis not present

## 2020-09-14 DIAGNOSIS — M25572 Pain in left ankle and joints of left foot: Secondary | ICD-10-CM | POA: Diagnosis not present

## 2020-09-20 DIAGNOSIS — Z1231 Encounter for screening mammogram for malignant neoplasm of breast: Secondary | ICD-10-CM | POA: Diagnosis not present

## 2020-09-21 DIAGNOSIS — M25572 Pain in left ankle and joints of left foot: Secondary | ICD-10-CM | POA: Diagnosis not present

## 2020-09-23 DIAGNOSIS — M25572 Pain in left ankle and joints of left foot: Secondary | ICD-10-CM | POA: Diagnosis not present

## 2020-09-28 DIAGNOSIS — M25572 Pain in left ankle and joints of left foot: Secondary | ICD-10-CM | POA: Diagnosis not present

## 2020-09-30 DIAGNOSIS — M25572 Pain in left ankle and joints of left foot: Secondary | ICD-10-CM | POA: Diagnosis not present

## 2020-10-05 DIAGNOSIS — M25572 Pain in left ankle and joints of left foot: Secondary | ICD-10-CM | POA: Diagnosis not present

## 2020-10-08 DIAGNOSIS — M25572 Pain in left ankle and joints of left foot: Secondary | ICD-10-CM | POA: Diagnosis not present

## 2020-10-11 DIAGNOSIS — M25572 Pain in left ankle and joints of left foot: Secondary | ICD-10-CM | POA: Diagnosis not present

## 2020-10-12 DIAGNOSIS — S93432D Sprain of tibiofibular ligament of left ankle, subsequent encounter: Secondary | ICD-10-CM | POA: Diagnosis not present

## 2020-10-12 DIAGNOSIS — S82852D Displaced trimalleolar fracture of left lower leg, subsequent encounter for closed fracture with routine healing: Secondary | ICD-10-CM | POA: Diagnosis not present

## 2020-10-26 DIAGNOSIS — M5432 Sciatica, left side: Secondary | ICD-10-CM | POA: Diagnosis not present

## 2020-10-26 DIAGNOSIS — Z79899 Other long term (current) drug therapy: Secondary | ICD-10-CM | POA: Diagnosis not present

## 2020-10-26 DIAGNOSIS — Z6822 Body mass index (BMI) 22.0-22.9, adult: Secondary | ICD-10-CM | POA: Diagnosis not present

## 2020-10-26 DIAGNOSIS — D702 Other drug-induced agranulocytosis: Secondary | ICD-10-CM | POA: Diagnosis not present

## 2020-10-26 DIAGNOSIS — M0589 Other rheumatoid arthritis with rheumatoid factor of multiple sites: Secondary | ICD-10-CM | POA: Diagnosis not present

## 2020-10-26 DIAGNOSIS — R768 Other specified abnormal immunological findings in serum: Secondary | ICD-10-CM | POA: Diagnosis not present

## 2020-10-28 DIAGNOSIS — R5383 Other fatigue: Secondary | ICD-10-CM | POA: Diagnosis not present

## 2020-10-28 DIAGNOSIS — R110 Nausea: Secondary | ICD-10-CM | POA: Diagnosis not present

## 2020-11-22 DIAGNOSIS — E039 Hypothyroidism, unspecified: Secondary | ICD-10-CM | POA: Diagnosis not present

## 2020-11-22 DIAGNOSIS — Z6821 Body mass index (BMI) 21.0-21.9, adult: Secondary | ICD-10-CM | POA: Diagnosis not present

## 2020-11-22 DIAGNOSIS — R42 Dizziness and giddiness: Secondary | ICD-10-CM | POA: Diagnosis not present

## 2020-12-14 ENCOUNTER — Ambulatory Visit: Payer: Self-pay | Admitting: Student

## 2020-12-14 DIAGNOSIS — S82852A Displaced trimalleolar fracture of left lower leg, initial encounter for closed fracture: Secondary | ICD-10-CM

## 2020-12-14 DIAGNOSIS — S82852D Displaced trimalleolar fracture of left lower leg, subsequent encounter for closed fracture with routine healing: Secondary | ICD-10-CM | POA: Diagnosis not present

## 2020-12-21 ENCOUNTER — Other Ambulatory Visit: Payer: Self-pay

## 2020-12-21 NOTE — Progress Notes (Signed)
Spoke with pt for pre-op call. Pt denies cardiac history and Diabetes.  Pt's surgery is scheduled as ambulatory so no Covid test is required prior to surgery. Pt denies any Covid symptoms at this time.

## 2020-12-21 NOTE — H&P (Signed)
[facility] ([facility]) H&P   Patient ID: Deborah Kerr MRN: [id number] DOB/AGE: [birth year] 75 y.o.  Reason for Surgery: Removal of hardware  HPI: Deborah Kerr is an 75 y.o. female who presents for removal of hardware. Underwent ORIF of ankle in Decmember 2021. Has been having intermittent swelling and drainage from wound for multiple months. Presents for removal of hardware.  Past Medical History:  Diagnosis Date   Arthritis    rheumatoid arthritis   Hypertension    Hypothyroidism 2009   Thyroid disease     Past Surgical History:  Procedure Laterality Date   APPENDECTOMY     per patient 1970s   COLONOSCOPY     COLONOSCOPY N/A 05/18/2015   Procedure: COLONOSCOPY;  Surgeon: Corbin Ade, MD;  Location: [facility];  Service: Endoscopy;  Laterality: N/A;  11:30   EYE SURGERY Bilateral 2021   cataract surgery   OOPHORECTOMY     ORIF ANKLE FRACTURE Left 06/24/2020   Procedure: OPEN REDUCTION INTERNAL FIXATION (ORIF) ANKLE FRACTURE;  Surgeon: Roby Lofts, MD;  Location: [facility] OR;  Service: Orthopedics;  Laterality: Left;   Thyroidectomy     TUBAL LIGATION      No family history on file.  Social History:  reports that she has never smoked. She has never used smokeless tobacco. She reports that she does not drink alcohol and does not use drugs.  Allergies:  Allergies  Allergen Reactions   Orencia [Abatacept]     Coughing, palm of hands itch     Medications:  No current facility-administered medications on file prior to encounter.   Current Outpatient Medications on File Prior to Encounter  Medication Sig Dispense Refill   acetaminophen (TYLENOL) 500 MG tablet Take 1,000 mg by mouth every 6 (six) hours as needed for moderate pain or headache.     aspirin EC 81 MG tablet Take 81 mg by mouth 2 (two) times daily. Swallow whole.     Calcium Carbonate-Vitamin D (CALCIUM 600+D PO) Take 1 tablet by mouth 2 (two) times daily.     chlorthalidone (HYGROTON) 25 MG  tablet Take 12.5 mg by mouth daily.     hydroxychloroquine (PLAQUENIL) 200 MG tablet Take 200 mg by mouth daily.     leflunomide (ARAVA) 20 MG tablet Take 20 mg by mouth daily.     levothyroxine (SYNTHROID) 125 MCG tablet Take 125 mcg by mouth daily before breakfast.     losartan (COZAAR) 100 MG tablet Take 100 mg by mouth daily.     potassium chloride SA (KLOR-CON) 20 MEQ tablet Take 10 mEq by mouth daily.     trolamine salicylate (ASPERCREME) 10 % cream Apply 1 application topically as needed for muscle pain.       ROS: Constitutional: No fever or chills Vision: No changes in vision ENT: No difficulty swallowing CV: No chest pain Pulm: No SOB or wheezing GI: No nausea or vomiting GU: No urgency or inability to hold urine Skin: No poor wound healing Neurologic: No numbness or tingling Psychiatric: No depression or anxiety Heme: No bruising Allergic: No reaction to medications or food   Exam: There were no vitals taken for this visit. General:NAD Orientation:AAOx3 Mood and Affect: Cooperative and pleasant Gait: WNL Coordination and balance: WNL  LLE: Incisions healed medially but with swelling about incision. Serous drainage from lateral incision. Good ROM. Sensation intact.  RLE: Skin without lesions. No tenderness to palpation. Full painless ROM, full strength in each muscle groups without evidence  of instability.   Medical Decision Making: Data: Imaging: Stable imaging with healed fracture  Labs: No results found for this or any previous visit (from the past 24 hour(s)).  Imaging or Labs ordered: None  Medical history and chart was reviewed and case discussed with medical provider.  Assessment/Plan: 75 year old s/p ORIF ankle with continued pain and swelling  I recommended to proceed with removal of hardware and cultures as there may be a subclinical infection. Risks and benefits discussed and she agrees to proceed with surgery. Patient will discharge  postoperatively.   Roby Lofts, MD [facility] [phone] (office) [email or web address]

## 2020-12-22 ENCOUNTER — Ambulatory Visit (HOSPITAL_COMMUNITY)
Admission: RE | Admit: 2020-12-22 | Discharge: 2020-12-22 | Disposition: A | Discharge status: Home / Self Care | Payer: Medicare Other | Attending: Student | Admitting: Student

## 2020-12-22 ENCOUNTER — Ambulatory Visit (HOSPITAL_COMMUNITY): Payer: Medicare Other | Admitting: Anesthesiology

## 2020-12-22 ENCOUNTER — Ambulatory Visit (HOSPITAL_COMMUNITY): Payer: Medicare Other

## 2020-12-22 ENCOUNTER — Encounter (HOSPITAL_COMMUNITY)
Admission: RE | Disposition: A | Discharge status: Home / Self Care | Payer: Self-pay | Source: Home / Self Care | Attending: Student

## 2020-12-22 ENCOUNTER — Encounter (HOSPITAL_COMMUNITY): Payer: Self-pay | Admitting: Student

## 2020-12-22 DIAGNOSIS — T8484XA Pain due to internal orthopedic prosthetic devices, implants and grafts, initial encounter: Secondary | ICD-10-CM | POA: Insufficient documentation

## 2020-12-22 DIAGNOSIS — Z472 Encounter for removal of internal fixation device: Secondary | ICD-10-CM | POA: Diagnosis not present

## 2020-12-22 DIAGNOSIS — X58XXXA Exposure to other specified factors, initial encounter: Secondary | ICD-10-CM | POA: Diagnosis not present

## 2020-12-22 DIAGNOSIS — B957 Other staphylococcus as the cause of diseases classified elsewhere: Secondary | ICD-10-CM | POA: Insufficient documentation

## 2020-12-22 DIAGNOSIS — S82852A Displaced trimalleolar fracture of left lower leg, initial encounter for closed fracture: Secondary | ICD-10-CM | POA: Insufficient documentation

## 2020-12-22 DIAGNOSIS — Z419 Encounter for procedure for purposes other than remedying health state, unspecified: Secondary | ICD-10-CM

## 2020-12-22 DIAGNOSIS — Z79899 Other long term (current) drug therapy: Secondary | ICD-10-CM | POA: Insufficient documentation

## 2020-12-22 DIAGNOSIS — I10 Essential (primary) hypertension: Secondary | ICD-10-CM | POA: Diagnosis not present

## 2020-12-22 DIAGNOSIS — Z888 Allergy status to other drugs, medicaments and biological substances status: Secondary | ICD-10-CM | POA: Diagnosis not present

## 2020-12-22 DIAGNOSIS — Z7982 Long term (current) use of aspirin: Secondary | ICD-10-CM | POA: Diagnosis not present

## 2020-12-22 DIAGNOSIS — E039 Hypothyroidism, unspecified: Secondary | ICD-10-CM | POA: Insufficient documentation

## 2020-12-22 DIAGNOSIS — B965 Pseudomonas (aeruginosa) (mallei) (pseudomallei) as the cause of diseases classified elsewhere: Secondary | ICD-10-CM | POA: Diagnosis not present

## 2020-12-22 DIAGNOSIS — S93432A Sprain of tibiofibular ligament of left ankle, initial encounter: Secondary | ICD-10-CM | POA: Diagnosis not present

## 2020-12-22 DIAGNOSIS — M069 Rheumatoid arthritis, unspecified: Secondary | ICD-10-CM | POA: Insufficient documentation

## 2020-12-22 HISTORY — PX: HARDWARE REMOVAL: SHX979

## 2020-12-22 LAB — CBC
HCT: 33.4 % — ABNORMAL LOW (ref 36.0–46.0)
Hemoglobin: 11 g/dL — ABNORMAL LOW (ref 12.0–15.0)
MCH: 31.5 pg (ref 26.0–34.0)
MCHC: 32.9 g/dL (ref 30.0–36.0)
MCV: 95.7 fL (ref 80.0–100.0)
Platelets: 259 10*3/uL (ref 150–400)
RBC: 3.49 MIL/uL — ABNORMAL LOW (ref 3.87–5.11)
RDW: 13 % (ref 11.5–15.5)
WBC: 5.1 10*3/uL (ref 4.0–10.5)
nRBC: 0 % (ref 0.0–0.2)

## 2020-12-22 LAB — BASIC METABOLIC PANEL
Anion gap: 10 (ref 5–15)
BUN: 16 mg/dL (ref 8–23)
CO2: 25 mmol/L (ref 22–32)
Calcium: 8.2 mg/dL — ABNORMAL LOW (ref 8.9–10.3)
Chloride: 99 mmol/L (ref 98–111)
Creatinine, Ser: 0.68 mg/dL (ref 0.44–1.00)
GFR, Estimated: >60 mL/min (ref >60)
Glucose, Bld: 89 mg/dL (ref 70–99)
Potassium: 3.2 mmol/L — ABNORMAL LOW (ref 3.5–5.1)
Sodium: 134 mmol/L — ABNORMAL LOW (ref 135–145)

## 2020-12-22 SURGERY — REMOVAL, HARDWARE
Anesthesia: General | Site: Ankle | Laterality: Left

## 2020-12-22 MED ORDER — 0.9 % SODIUM CHLORIDE (POUR BTL) OPTIME
TOPICAL | Status: DC | PRN
  Administered 2020-12-22: 1000 mL

## 2020-12-22 MED ORDER — BUPIVACAINE HCL (PF) 0.25 % IJ SOLN
INTRAMUSCULAR | Status: AC
  Filled 2020-12-22: qty 30

## 2020-12-22 MED ORDER — FENTANYL CITRATE (PF) 100 MCG/2ML IJ SOLN: 25.0000 ug | INTRAMUSCULAR | Status: DC | PRN

## 2020-12-22 MED ORDER — PROPOFOL 10 MG/ML IV BOLUS
INTRAVENOUS | Status: DC | PRN
  Administered 2020-12-22: 200 mg via INTRAVENOUS
  Administered 2020-12-22: 30 mg via INTRAVENOUS

## 2020-12-22 MED ORDER — CHLORHEXIDINE GLUCONATE 0.12 % MT SOLN
OROMUCOSAL | Status: AC
  Administered 2020-12-22: 15 mL via OROMUCOSAL
  Filled 2020-12-22: qty 15

## 2020-12-22 MED ORDER — FENTANYL CITRATE (PF) 250 MCG/5ML IJ SOLN
INTRAMUSCULAR | Status: AC
  Filled 2020-12-22: qty 5

## 2020-12-22 MED ORDER — HYDROCODONE-ACETAMINOPHEN 5-325 MG PO TABS: 1.0000 | ORAL_TABLET | Freq: Four times a day (QID) | ORAL | 0 refills | Status: DC | PRN

## 2020-12-22 MED ORDER — VANCOMYCIN HCL 1000 MG IV SOLR
INTRAVENOUS | Status: DC | PRN
  Administered 2020-12-22: 1000 mg via TOPICAL

## 2020-12-22 MED ORDER — LACTATED RINGERS IV SOLN: INTRAVENOUS | Status: DC

## 2020-12-22 MED ORDER — LIDOCAINE HCL (CARDIAC) PF 100 MG/5ML IV SOSY
PREFILLED_SYRINGE | INTRAVENOUS | Status: DC | PRN
  Administered 2020-12-22: 60 mg via INTRAVENOUS

## 2020-12-22 MED ORDER — PROPOFOL 10 MG/ML IV BOLUS
INTRAVENOUS | Status: AC
  Filled 2020-12-22: qty 20

## 2020-12-22 MED ORDER — ACETAMINOPHEN 10 MG/ML IV SOLN
INTRAVENOUS | Status: AC
  Administered 2020-12-22: 1000 mg
  Filled 2020-12-22: qty 100

## 2020-12-22 MED ORDER — ONDANSETRON HCL 4 MG/2ML IJ SOLN: 4.0000 mg | Freq: Once | INTRAMUSCULAR | Status: DC | PRN

## 2020-12-22 MED ORDER — LACTATED RINGERS IV SOLN: INTRAVENOUS | Status: DC | PRN

## 2020-12-22 MED ORDER — CHLORHEXIDINE GLUCONATE 0.12 % MT SOLN: 15.0000 mL | Freq: Once | OROMUCOSAL | Status: AC

## 2020-12-22 MED ORDER — CIPROFLOXACIN HCL 500 MG PO TABS: 500.0000 mg | ORAL_TABLET | Freq: Two times a day (BID) | ORAL | 0 refills | Status: AC

## 2020-12-22 MED ORDER — AMISULPRIDE (ANTIEMETIC) 5 MG/2ML IV SOLN: 10.0000 mg | Freq: Once | INTRAVENOUS | Status: DC | PRN

## 2020-12-22 MED ORDER — CEFAZOLIN SODIUM-DEXTROSE 2-3 GM-%(50ML) IV SOLR
INTRAVENOUS | Status: DC | PRN
  Administered 2020-12-22: 2 g via INTRAVENOUS

## 2020-12-22 MED ORDER — PHENYLEPHRINE 40 MCG/ML (10ML) SYRINGE FOR IV PUSH (FOR BLOOD PRESSURE SUPPORT)
PREFILLED_SYRINGE | INTRAVENOUS | Status: AC
  Filled 2020-12-22: qty 10

## 2020-12-22 MED ORDER — ACETAMINOPHEN 10 MG/ML IV SOLN: 1000.0000 mg | Freq: Once | INTRAVENOUS | Status: DC | PRN

## 2020-12-22 MED ORDER — ONDANSETRON HCL 4 MG/2ML IJ SOLN
INTRAMUSCULAR | Status: DC | PRN
  Administered 2020-12-22: 4 mg via INTRAVENOUS

## 2020-12-22 MED ORDER — PHENYLEPHRINE HCL (PRESSORS) 10 MG/ML IV SOLN
INTRAVENOUS | Status: DC | PRN
  Administered 2020-12-22 (×4): 80 ug via INTRAVENOUS

## 2020-12-22 MED ORDER — DEXAMETHASONE SODIUM PHOSPHATE 4 MG/ML IJ SOLN
INTRAMUSCULAR | Status: DC | PRN
  Administered 2020-12-22: 4 mg via INTRAVENOUS

## 2020-12-22 MED ORDER — FENTANYL CITRATE (PF) 100 MCG/2ML IJ SOLN
INTRAMUSCULAR | Status: DC | PRN
  Administered 2020-12-22: 50 ug via INTRAVENOUS

## 2020-12-22 MED ORDER — ORAL CARE MOUTH RINSE: 15.0000 mL | Freq: Once | OROMUCOSAL | Status: AC

## 2020-12-22 MED ORDER — BUPIVACAINE HCL (PF) 0.25 % IJ SOLN
INTRAMUSCULAR | Status: DC | PRN
  Administered 2020-12-22: 15 mL

## 2020-12-22 MED ORDER — SUFENTANIL CITRATE 50 MCG/ML IV SOLN
INTRAVENOUS | Status: AC
  Filled 2020-12-22: qty 1

## 2020-12-22 MED ORDER — VANCOMYCIN HCL 1000 MG IV SOLR
INTRAVENOUS | Status: AC
  Filled 2020-12-22: qty 1000

## 2020-12-22 SURGICAL SUPPLY — 61 items
APL PRP STRL LF DISP 70% ISPRP (MISCELLANEOUS) ×1
BANDAGE ESMARK 6X9 LF (GAUZE/BANDAGES/DRESSINGS) ×1 IMPLANT
BNDG CMPR 9X6 STRL LF SNTH (GAUZE/BANDAGES/DRESSINGS)
BNDG COHESIVE 6X5 TAN STRL LF (GAUZE/BANDAGES/DRESSINGS) ×2 IMPLANT
BNDG ELASTIC 4X5.8 VLCR STR LF (GAUZE/BANDAGES/DRESSINGS) ×2 IMPLANT
BNDG ELASTIC 6X5.8 VLCR STR LF (GAUZE/BANDAGES/DRESSINGS) ×2 IMPLANT
BNDG ESMARK 6X9 LF (GAUZE/BANDAGES/DRESSINGS)
BNDG GAUZE ELAST 4 BULKY (GAUZE/BANDAGES/DRESSINGS) ×4 IMPLANT
BRUSH SCRUB EZ PLAIN DRY (MISCELLANEOUS) ×2 IMPLANT
CHLORAPREP W/TINT 26 (MISCELLANEOUS) ×2 IMPLANT
COVER SURGICAL LIGHT HANDLE (MISCELLANEOUS) ×4 IMPLANT
COVER WAND RF STERILE (DRAPES) ×2 IMPLANT
CUFF TOURN SGL QUICK 18X4 (TOURNIQUET CUFF) IMPLANT
CUFF TOURN SGL QUICK 24 (TOURNIQUET CUFF)
CUFF TOURN SGL QUICK 34 (TOURNIQUET CUFF)
CUFF TRNQT CYL 24X4X16.5-23 (TOURNIQUET CUFF) IMPLANT
CUFF TRNQT CYL 34X4.125X (TOURNIQUET CUFF) IMPLANT
DRAPE C-ARM 42X72 X-RAY (DRAPES) ×1 IMPLANT
DRAPE C-ARMOR (DRAPES) ×2 IMPLANT
DRAPE U-SHAPE 47X51 STRL (DRAPES) ×2 IMPLANT
DRSG ADAPTIC 3X8 NADH LF (GAUZE/BANDAGES/DRESSINGS) ×2 IMPLANT
ELECT REM PT RETURN 9FT ADLT (ELECTROSURGICAL) ×2
ELECTRODE REM PT RTRN 9FT ADLT (ELECTROSURGICAL) ×1 IMPLANT
GAUZE SPONGE 4X4 12PLY STRL (GAUZE/BANDAGES/DRESSINGS) ×2 IMPLANT
GLOVE SURG ENC MOIS LTX SZ6.5 (GLOVE) ×6 IMPLANT
GLOVE SURG ENC MOIS LTX SZ7.5 (GLOVE) ×8 IMPLANT
GLOVE SURG UNDER POLY LF SZ6.5 (GLOVE) ×2 IMPLANT
GLOVE SURG UNDER POLY LF SZ7.5 (GLOVE) ×2 IMPLANT
GOWN STRL REUS W/ TWL LRG LVL3 (GOWN DISPOSABLE) ×2 IMPLANT
GOWN STRL REUS W/TWL LRG LVL3 (GOWN DISPOSABLE) ×4
KIT BASIN OR (CUSTOM PROCEDURE TRAY) ×2 IMPLANT
KIT TURNOVER KIT B (KITS) ×2 IMPLANT
MANIFOLD NEPTUNE II (INSTRUMENTS) ×2 IMPLANT
NEEDLE 22X1 1/2 (OR ONLY) (NEEDLE) IMPLANT
NS IRRIG 1000ML POUR BTL (IV SOLUTION) ×2 IMPLANT
PACK ORTHO EXTREMITY (CUSTOM PROCEDURE TRAY) ×2 IMPLANT
PAD ARMBOARD 7.5X6 YLW CONV (MISCELLANEOUS) ×4 IMPLANT
PADDING CAST COTTON 6X4 STRL (CAST SUPPLIES) ×6 IMPLANT
SPONGE LAP 18X18 RF (DISPOSABLE) ×2 IMPLANT
STAPLER VISISTAT 35W (STAPLE) IMPLANT
STOCKINETTE IMPERVIOUS LG (DRAPES) ×1 IMPLANT
STRIP CLOSURE SKIN 1/2X4 (GAUZE/BANDAGES/DRESSINGS) IMPLANT
SUCTION FRAZIER HANDLE 10FR (MISCELLANEOUS) ×2
SUCTION TUBE FRAZIER 10FR DISP (MISCELLANEOUS) IMPLANT
SUT ETHILON 3 0 PS 1 (SUTURE) ×2 IMPLANT
SUT MNCRL AB 3-0 PS2 18 (SUTURE) ×1 IMPLANT
SUT MON AB 2-0 CT1 36 (SUTURE) ×1 IMPLANT
SUT PDS AB 2-0 CT1 27 (SUTURE) IMPLANT
SUT VIC AB 0 CT1 27 (SUTURE)
SUT VIC AB 0 CT1 27XBRD ANBCTR (SUTURE) IMPLANT
SUT VIC AB 2-0 CT1 27 (SUTURE) ×4
SUT VIC AB 2-0 CT1 TAPERPNT 27 (SUTURE) IMPLANT
SWAB COLLECTION DEVICE MRSA (MISCELLANEOUS) ×2 IMPLANT
SWAB CULTURE ESWAB REG 1ML (MISCELLANEOUS) ×2 IMPLANT
SYR CONTROL 10ML LL (SYRINGE) ×1 IMPLANT
TOWEL GREEN STERILE (TOWEL DISPOSABLE) ×4 IMPLANT
TOWEL GREEN STERILE FF (TOWEL DISPOSABLE) ×4 IMPLANT
TUBE CONNECTING 12X1/4 (SUCTIONS) ×2 IMPLANT
UNDERPAD 30X36 HEAVY ABSORB (UNDERPADS AND DIAPERS) ×2 IMPLANT
WATER STERILE IRR 1000ML POUR (IV SOLUTION) ×2 IMPLANT
YANKAUER SUCT BULB TIP NO VENT (SUCTIONS) ×2 IMPLANT

## 2020-12-22 NOTE — Anesthesia Preprocedure Evaluation (Signed)
Anesthesia Evaluation  Patient identified by MRN, date of birth, ID band Patient awake    Reviewed: Allergy & Precautions, NPO status , Patient's Chart, lab work & pertinent test results  Airway Mallampati: II  TM Distance: >3 FB Neck ROM: Full    Dental no notable dental hx.    Pulmonary neg pulmonary ROS,    Pulmonary exam normal breath sounds clear to auscultation       Cardiovascular hypertension, Pt. on medications Normal cardiovascular exam Rhythm:Regular Rate:Normal  ECG: NSR, rate 85. LAD   Neuro/Psych negative neurological ROS  negative psych ROS   GI/Hepatic negative GI ROS, Neg liver ROS,   Endo/Other  Hypothyroidism   Renal/GU negative Renal ROS     Musculoskeletal  (+) Arthritis , Rheumatoid disorders,    Abdominal   Peds  Hematology negative hematology ROS (+)   Anesthesia Other Findings Left ankle swelling and pain  Reproductive/Obstetrics                             Anesthesia Physical Anesthesia Plan  ASA: 2  Anesthesia Plan: General   Post-op Pain Management:    Induction: Intravenous  PONV Risk Score and Plan: 3 and Ondansetron, Dexamethasone, Midazolam and Treatment may vary due to age or medical condition  Airway Management Planned: LMA  Additional Equipment:   Intra-op Plan:   Post-operative Plan: Extubation in OR  Informed Consent: I have reviewed the patients History and Physical, chart, labs and discussed the procedure including the risks, benefits and alternatives for the proposed anesthesia with the patient or authorized representative who has indicated his/her understanding and acceptance.     Dental advisory given  Plan Discussed with: CRNA  Anesthesia Plan Comments: (Potential regional anesthesia discussed)       Anesthesia Quick Evaluation

## 2020-12-22 NOTE — Anesthesia Postprocedure Evaluation (Signed)
Anesthesia Post Note  Patient: Deborah Kerr  Procedure(s) Performed: HARDWARE REMOVAL LEFT ANKLE (Left: Ankle)     Patient location during evaluation: PACU Anesthesia Type: General Level of consciousness: awake Pain management: pain level controlled Vital Signs Assessment: post-procedure vital signs reviewed and stable Respiratory status: spontaneous breathing, nonlabored ventilation, respiratory function stable and patient connected to nasal cannula oxygen Cardiovascular status: blood pressure returned to baseline and stable Postop Assessment: no apparent nausea or vomiting Anesthetic complications: no   No notable events documented.  Last Vitals:  Vitals:   12/22/20 0917 12/22/20 0921  BP:  (!) 175/95  Pulse: 81   Resp: (!) 21   Temp:  (!) 36.4 C  SpO2: 98%     Last Pain:  Vitals:   12/22/20 0921  TempSrc: Axillary  PainSc:                  Catheryn Bacon Inocencia Murtaugh

## 2020-12-22 NOTE — Anesthesia Procedure Notes (Signed)
Procedure Name: LMA Insertion Date/Time: 12/22/2020 7:32 AM Performed by: Tillman Abide, CRNA Pre-anesthesia Checklist: Patient identified, Emergency Drugs available, Suction available and Patient being monitored Patient Re-evaluated:Patient Re-evaluated prior to induction Oxygen Delivery Method: Circle System Utilized Preoxygenation: Pre-oxygenation with 100% oxygen Induction Type: IV induction Ventilation: Mask ventilation without difficulty LMA: LMA inserted LMA Size: 3.0 Number of attempts: 1 Placement Confirmation: positive ETCO2 Tube secured with: Tape Dental Injury: Teeth and Oropharynx as per pre-operative assessment

## 2020-12-22 NOTE — Discharge Instructions (Addendum)
[facility] Discharge Instructions   General Discharge Instructions  WEIGHT BEARING STATUS:Weightbearing as tolerated  RANGE OF MOTION/ACTIVITY:OK for ankle motion as tolerated  Wound Care:You may remove your surgical dressing post op day #3 (Saturday 12/25/20)/ Incisions can be left open to air if there is no drainage. If incision continues to have drainage, follow wound care instructions below. Okay to shower if no drainage from incisions.  DVT/PE prophylaxis: None  Diet: as you were eating previously.  Can use over the counter stool softeners and bowel preparations, such as Miralax, to help with bowel movements.  Narcotics can be constipating.  Be sure to drink plenty of fluids  PAIN MEDICATION USE AND EXPECTATIONS  You have likely been given narcotic medications to help control your pain.  After a traumatic event that results in an fracture (broken bone) with or without surgery, it is ok to use narcotic pain medications to help control one's pain.  We understand that everyone responds to pain differently and each individual patient will be evaluated on a regular basis for the continued need for narcotic medications. Ideally, narcotic medication use should last no more than 6-8 weeks (coinciding with fracture healing).   As a patient it is your responsibility as well to monitor narcotic medication use and report the amount and frequency you use these medications when you come to your office visit.   We would also advise that if you are using narcotic medications, you should take a dose prior to therapy to maximize you participation.  IF YOU ARE ON NARCOTIC MEDICATIONS IT IS NOT PERMISSIBLE TO OPERATE A MOTOR VEHICLE (MOTORCYCLE/CAR/TRUCK/MOPED) OR HEAVY MACHINERY DO NOT MIX NARCOTICS WITH OTHER CNS (CENTRAL NERVOUS SYSTEM) DEPRESSANTS SUCH AS ALCOHOL   STOP SMOKING OR USING NICOTINE PRODUCTS!!!!  As discussed nicotine severely impairs your body's ability to heal  surgical and traumatic wounds but also impairs bone healing.  Wounds and bone heal by forming microscopic blood vessels (angiogenesis) and nicotine is a vasoconstrictor (essentially, shrinks blood vessels).  Therefore, if vasoconstriction occurs to these microscopic blood vessels they essentially disappear and are unable to deliver necessary nutrients to the healing tissue.  This is one modifiable factor that you can do to dramatically increase your chances of healing your injury.    (This means no smoking, no nicotine gum, patches, etc)  DO NOT USE NONSTEROIDAL ANTI-INFLAMMATORY DRUGS (NSAID'S)  Using products such as Advil (ibuprofen), Aleve (naproxen), Motrin (ibuprofen) for additional pain control during fracture healing can delay and/or prevent the healing response.  If you would like to take over the counter (OTC) medication, Tylenol (acetaminophen) is ok.  However, some narcotic medications that are given for pain control contain acetaminophen as well. Therefore, you should not exceed more than 4000 mg of tylenol in a day if you do not have liver disease.  Also note that there are may OTC medicines, such as cold medicines and allergy medicines that my contain tylenol as well.  If you have any questions about medications and/or interactions please ask your doctor/PA or your pharmacist.      ICE AND ELEVATE INJURED/OPERATIVE EXTREMITY  Using ice and elevating the injured extremity above your heart can help with swelling and pain control.  Icing in a pulsatile fashion, such as 20 minutes on and 20 minutes off, can be followed.    Do not place ice directly on skin. Make sure there is a barrier between to skin and the ice pack.    Using frozen items such  as frozen peas works well as the conform nicely to the are that needs to be iced.  USE AN ACE WRAP OR TED HOSE FOR SWELLING CONTROL  In addition to icing and elevation, Ace wraps or TED hose are used to help limit and resolve swelling.  It is  recommended to use Ace wraps or TED hose until you are informed to stop.    When using Ace Wraps start the wrapping distally (farthest away from the body) and wrap proximally (closer to the body)   Example: If you had surgery on your leg or thing and you do not have a splint on, start the ace wrap at the toes and work your way up to the thigh        If you had surgery on your upper extremity and do not have a splint on, start the ace wrap at your fingers and work your way up to the upper arm     CALL THE OFFICE WITH ANY QUESTIONS OR CONCERNS: [phone]   VISIT OUR WEBSITE FOR ADDITIONAL INFORMATION: [email or web address]    Discharge Wound Care Instructions  Do NOT apply any ointments, solutions or lotions to pin sites or surgical wounds.  These prevent needed drainage and even though solutions like hydrogen peroxide kill bacteria, they also damage cells lining the pin sites that help fight infection.  Applying lotions or ointments can keep the wounds moist and can cause them to breakdown and open up as well. This can increase the risk for infection. When in doubt call the office.   If any drainage is noted, use one layer of adaptic, then gauze, Kerlix, and an ace wrap.  Once the incision is completely dry and without drainage, it may be left open to air out.  Showering may begin 36-48 hours later.  Cleaning gently with soap and water.  Traumatic wounds should be dressed daily as well.    One layer of adaptic, gauze, Kerlix, then ace wrap.  The adaptic can be discontinued once the draining has ceased    If you have a wet to dry dressing: wet the gauze with saline the squeeze as much saline out so the gauze is moist (not soaking wet), place moistened gauze over wound, then place a dry gauze over the moist one, followed by Kerlix wrap, then ace wrap.    Post Anesthesia Home Care Instructions  Activity: Get plenty of rest for the remainder of the day. A responsible individual must stay  with you for 24 hours following the procedure.  For the next 24 hours, DO NOT: -Drive a car -Advertising copywriter -Drink alcoholic beverages -Take any medication unless instructed by your physician -Make any legal decisions or sign important papers.  Meals: Start with liquid foods such as gelatin or soup. Progress to regular foods as tolerated. Avoid greasy, spicy, heavy foods. If nausea and/or vomiting occur, drink only clear liquids until the nausea and/or vomiting subsides. Call your physician if vomiting continues.  Special Instructions/Symptoms: Your throat may feel dry or sore from the anesthesia or the breathing tube placed in your throat during surgery. If this causes discomfort, gargle with warm salt water. The discomfort should disappear within 24 hours.

## 2020-12-22 NOTE — Op Note (Signed)
Orthopaedic Surgery Operative Note (CSN: [id number] ) Date of Surgery: 12/22/2020  Admit Date: 12/22/2020   Diagnoses: Pre-Op Diagnoses: Left ankle pain and swelling following ORIF of ankle  Post-Op Diagnosis: Same  Procedures: CPT 20680-Removal of hardware left ankle CPT 20240-Superficial cultures from left ankle  Surgeons : Primary: Roby Lofts, MD  Assistant: Ulyses Southward, PA-C  Location: OR 9   Anesthesia:General  Antibiotics: Ancef 2g after cultures taken   Tourniquet time:None   Estimated Blood Loss:35 mL  Complications:None   Specimens: ID Type Source Tests Collected by Time Destination  A : lateral left ankle Tissue PATH Other AEROBIC/ANAEROBIC CULTURE W GRAM STAIN (SURGICAL/DEEP WOUND) Roby Lofts, MD 12/22/2020 [phone]   B : medial left ankle Tissue PATH Other AEROBIC/ANAEROBIC CULTURE W GRAM STAIN (SURGICAL/DEEP WOUND) Roby Lofts, MD 12/22/2020 [phone]      Implants: * No implants in log *   Indications for Surgery: 75 year old female who underwent open reduction internal fixation of her left ankle in December 2021.  She was doing well but subsequently developed increased pain and swelling in the ankle.  She also had intermittent drainage from her lateral incision for multiple months following the surgery.  Due to the persistent drainage and increased swelling I recommended proceeding to the operating room for removal of hardware and deep cultures of the left ankle.  I discussed risks and benefits with the patient.  Risks include but not limited to bleeding, and infection, refracture, continued ankle stiffness and continued pain, nerve or blood vessel injury, need for antibiotics, even the possibility anesthetic complications.  The patient agreed to proceed with surgery consent was obtained.  Operative Findings: 1.  Successful removal of hardware without complication 2.  Purulent appearing material both medial and laterally that was cultured from around the  plate and around the suture button for the syndesmotic fixation  Procedure: The patient was identified in the preoperative holding area. Consent was confirmed with the patient and their family and all questions were answered. The operative extremity was marked after confirmation with the patient. she was then brought back to the operating room by our anesthesia colleagues.  She was placed under a general anesthetic and carefully transferred over to a radiolucent flat top table.  The left lower extremity was prepped and draped in usual sterile fashion.  Timeout was performed to verify the patient, the procedure, and the extremity.  Preoperative antibiotics were held until cultures were obtained.  I reopened the lateral incision and carried this down all the way down to the plate.  There was some fibrinous and some questionable purulent material that was surrounding the plate.  I took cultures of this and sent it to the lab.  I then proceeded to remove all of the screws from the lateral plate.  The suture fixation was still in place turned my attention to the medial side and reopen the medial incision.  There was some swelling and collection proximal to the syndesmotic button.  When I encountered this there was some purulent material around this that I ended up culturing and sending to the lab.  I then remove the medial screws.  I was then successfully able to remove the plate and the suture button fixation as well as the suture.  I then proceeded to thoroughly irrigate both the medial and lateral side.  I used a Cobb elevator to debride the soft tissue surrounding the bone both medially and laterally.  I then used a curette to debride the syndesmotic  tract from the fibula to the tibia.  I then irrigated again to make sure that the wound was completely cleaned.  Gloves and instruments were then changed.  Final fluoroscopic imaging was obtained.  1 g of vancomycin powder was placed into the incisions.  Layered  closure of 2-0 Monocryl and 3-0 nylon was used to close the skin.  Sterile dressings were applied.  The patient was then awoken from anesthesia and taken to the PACU in stable condition.  Post Op Plan/Instructions: Patient will be weightbearing as tolerated to left lower extremity.  She will discharge home from the PACU.  No DVT prophylaxis is needed in this ambulatory patient.  Will place her on ciprofloxacin for a prophylaxis following surgery and follow-up cultures and change her antibiotic regimen as needed.  I was present and performed the entire surgery.  Ulyses Southward, PA-C did assist me throughout the case. An assistant was necessary given the difficulty in approach, maintenance of reduction and ability to instrument the fracture.   Truitt Merle, MD [facility]

## 2020-12-22 NOTE — Transfer of Care (Signed)
Immediate Anesthesia Transfer of Care Note  Patient: [name]  Procedure(s) Performed: HARDWARE REMOVAL LEFT ANKLE (Left: Ankle)  Patient Location: PACU  Anesthesia Type:General  Level of Consciousness: awake, alert , oriented and patient cooperative  Airway & Oxygen Therapy: Patient Spontanous Breathing  Post-op Assessment: Report given to RN and Post -op Vital signs reviewed and stable  Post vital signs: Reviewed and stable  Last Vitals:  Vitals Value Taken Time  BP 147/99 12/22/20 0859  Temp 36.4 C 12/22/20 0842  Pulse 73 12/22/20 0911  Resp 14 12/22/20 0911  SpO2 96 % 12/22/20 0911  Vitals shown include unvalidated device data.  Last Pain:  Vitals:   12/22/20 0859  TempSrc:   PainSc: 6       Patients Stated Pain Goal: 3 (12/22/20 0859)  Complications: No notable events documented.

## 2020-12-23 ENCOUNTER — Encounter (HOSPITAL_COMMUNITY): Payer: Self-pay | Admitting: Student

## 2020-12-27 LAB — AEROBIC/ANAEROBIC CULTURE W GRAM STAIN (SURGICAL/DEEP WOUND): Gram Stain: NONE SEEN

## 2021-01-04 DIAGNOSIS — S93432D Sprain of tibiofibular ligament of left ankle, subsequent encounter: Secondary | ICD-10-CM | POA: Diagnosis not present

## 2021-01-04 DIAGNOSIS — S82852D Displaced trimalleolar fracture of left lower leg, subsequent encounter for closed fracture with routine healing: Secondary | ICD-10-CM | POA: Diagnosis not present

## 2021-01-19 DIAGNOSIS — E039 Hypothyroidism, unspecified: Secondary | ICD-10-CM | POA: Diagnosis not present

## 2021-01-19 DIAGNOSIS — E7849 Other hyperlipidemia: Secondary | ICD-10-CM | POA: Diagnosis not present

## 2021-01-19 DIAGNOSIS — I10 Essential (primary) hypertension: Secondary | ICD-10-CM | POA: Diagnosis not present

## 2021-01-19 DIAGNOSIS — E782 Mixed hyperlipidemia: Secondary | ICD-10-CM | POA: Diagnosis not present

## 2021-01-19 DIAGNOSIS — R5383 Other fatigue: Secondary | ICD-10-CM | POA: Diagnosis not present

## 2021-01-24 DIAGNOSIS — Z1331 Encounter for screening for depression: Secondary | ICD-10-CM | POA: Diagnosis not present

## 2021-01-24 DIAGNOSIS — D709 Neutropenia, unspecified: Secondary | ICD-10-CM | POA: Diagnosis not present

## 2021-01-24 DIAGNOSIS — Z1389 Encounter for screening for other disorder: Secondary | ICD-10-CM | POA: Diagnosis not present

## 2021-01-24 DIAGNOSIS — M0579 Rheumatoid arthritis with rheumatoid factor of multiple sites without organ or systems involvement: Secondary | ICD-10-CM | POA: Diagnosis not present

## 2021-01-24 DIAGNOSIS — E7849 Other hyperlipidemia: Secondary | ICD-10-CM | POA: Diagnosis not present

## 2021-01-24 DIAGNOSIS — Z23 Encounter for immunization: Secondary | ICD-10-CM | POA: Diagnosis not present

## 2021-01-24 DIAGNOSIS — I10 Essential (primary) hypertension: Secondary | ICD-10-CM | POA: Diagnosis not present

## 2021-02-04 DIAGNOSIS — M0579 Rheumatoid arthritis with rheumatoid factor of multiple sites without organ or systems involvement: Secondary | ICD-10-CM | POA: Diagnosis not present

## 2021-02-04 DIAGNOSIS — Z20828 Contact with and (suspected) exposure to other viral communicable diseases: Secondary | ICD-10-CM | POA: Diagnosis not present

## 2021-02-10 ENCOUNTER — Ambulatory Visit: Payer: Medicare Other | Admitting: Orthopaedic Surgery

## 2021-02-10 ENCOUNTER — Other Ambulatory Visit: Payer: Self-pay

## 2021-02-17 ENCOUNTER — Other Ambulatory Visit: Payer: Self-pay

## 2021-02-17 ENCOUNTER — Other Ambulatory Visit (INDEPENDENT_AMBULATORY_CARE_PROVIDER_SITE_OTHER): Payer: Medicare Other

## 2021-02-17 ENCOUNTER — Ambulatory Visit (INDEPENDENT_AMBULATORY_CARE_PROVIDER_SITE_OTHER): Payer: Medicare Other | Admitting: Orthopaedic Surgery

## 2021-02-17 ENCOUNTER — Encounter: Payer: Self-pay | Admitting: Orthopaedic Surgery

## 2021-02-17 VITALS — Ht 62.0 in | Wt 120.0 lb

## 2021-02-17 DIAGNOSIS — M48062 Spinal stenosis, lumbar region with neurogenic claudication: Secondary | ICD-10-CM | POA: Diagnosis not present

## 2021-02-17 DIAGNOSIS — M545 Low back pain, unspecified: Secondary | ICD-10-CM

## 2021-02-17 DIAGNOSIS — G8929 Other chronic pain: Secondary | ICD-10-CM

## 2021-02-17 DIAGNOSIS — M48061 Spinal stenosis, lumbar region without neurogenic claudication: Secondary | ICD-10-CM | POA: Insufficient documentation

## 2021-02-17 NOTE — Progress Notes (Signed)
Office Visit Note   Patient: Deborah Kerr           Date of Birth: 1945/09/01           MRN: [id number] Visit Date: 02/17/2021              Requested by: Sasser, Clarene Critchley, MD [address] [place],  [place] [address] PCP: Estanislado Pandy, MD   Assessment & Plan: Visit Diagnoses:  1. Chronic bilateral low back pain, unspecified whether sciatica present   2. Spinal stenosis of lumbar region with neurogenic claudication     Plan: We will set patient up for some physical therapy and recheck her in 5 weeks.  Her symptoms are consistent with scoliosis and she likely has some lateral recess stenosis on the left side when she had acute sciatica which is improved.  We will check her back after therapy.  She can apply some lotion the dry areas of her leg and elevate her leg with the mild ankle swelling that is present.  Follow-Up Instructions: Return if symptoms worsen or fail to improve.   Orders:  Orders Placed This Encounter  Procedures   XR Lumbar Spine Complete   No orders of the defined types were placed in this encounter.     Procedures: No procedures performed   Clinical Data: No additional findings.   Subjective: Chief Complaint  Patient presents with   Lower Back - Pain    75 year old female with low back pain with previous sciatica earlier in the year which is eased off some.  She had a fracture of her ankle fixed by Dr. Jena Gauss left ankle and then had problems with lateral incision and drainage.  This has improved no current cellulitis.  Patient does take hydroxychloroquine thyroid supplement also some blood pressure medication.  Patient has a diagnosis of RA.  Also problems with the cataracts hypertension.  Patient states the radicular symptoms in her leg is improved some but she continues to have back pain that radiates into her buttocks and into her thigh.  She has used over-the-counter medications heating pad with some improvement.  No symptoms currently on her right.  She  states she cannot stand very long when she cooks she has to stop and sit on a stool.  When she goes to the grocery store she has to lean on a cart.  Sometimes she can walk several blocks other days she has to stop and sit before she repeats a short distance.  Review of Systems all other 14 point systems updated noncontributory to HPI other than as mentioned above.   Objective: Vital Signs: Ht 5\' 2"  (1.575 m)   Wt 120 lb (54.4 kg)   BMI 21.95 kg/m   Physical Exam Constitutional:      Appearance: She is well-developed.  HENT:     Head: Normocephalic.     Right Ear: External ear normal.     Left Ear: External ear normal. There is no impacted cerumen.  Eyes:     Pupils: Pupils are equal, round, and reactive to light.  Neck:     Thyroid: No thyromegaly.     Trachea: No tracheal deviation.  Cardiovascular:     Rate and Rhythm: Normal rate.  Pulmonary:     Effort: Pulmonary effort is normal.  Abdominal:     Palpations: Abdomen is soft.  Musculoskeletal:     Cervical back: No rigidity.  Skin:    General: Skin is warm and dry.  Neurological:     Mental Status: She is alert and oriented to person, place, and time.  Psychiatric:        Behavior: Behavior normal.    Ortho Exam patient has some crusty eschar proximal distal aspect of the lateral malleolar incision without cellulitis no purulent drainage.  Scoliosis present mild public obliquity left higher than right with left lumbar curvature.  Sciatic notch tenderness on the left with some pain with straight leg raising at 90 degrees knee and ankle jerk are intact.  Anterior tib gastrocsoleus is intact.  Specialty Comments:  No specialty comments available.  Imaging: XR Lumbar Spine Complete  Result Date: 02/17/2021 [facility] lateral lumbar x-rays obtained and reviewed.  This shows left lumbar curvature approximately 20 degrees.  Complete collapse of L4-5 disc space with endplate abutment.  Facet arthropathy noted.  Multilevel disc space  narrowing at other levels of the lumbar spine with mild spurring.  No lateral listhesis or anterolisthesis. Impression scoliosis with multilevel disc degeneration most pronounced at L4-5 and then L2-3.    PMFS History: Patient Active Problem List   Diagnosis Date Noted   Spinal stenosis of lumbar region 02/17/2021   Closed trimalleolar fracture of left ankle 06/22/2020   Special screening for malignant neoplasms, colon    Diverticulosis of colon without hemorrhage    Past Medical History:  Diagnosis Date   Arthritis    rheumatoid arthritis   Hypertension    Hypothyroidism 2009   Thyroid disease     No family history on file.  Past Surgical History:  Procedure Laterality Date   APPENDECTOMY     per patient 1970s   COLONOSCOPY     COLONOSCOPY N/A 05/18/2015   Procedure: COLONOSCOPY;  Surgeon: Corbin Ade, MD;  Location[facility];  Service: Endoscopy;  Laterality: N/A;  11:30   EYE SURGERY Bilateral 2021   cataract surgery   HARDWARE REMOVAL Left 12/22/2020   Procedure: HARDWARE REMOVAL LEFT ANKLE;  Surgeon: Roby Lofts, MD;  Location: [facility];  Service: Orthopedics;  Laterality: Left;   OOPHORECTOMY     ORIF ANKLE FRACTURE Left 06/24/2020   Procedure: OPEN REDUCTION INTERNAL FIXATION (ORIF) ANKLE FRACTURE;  Surgeon: Roby Lofts, MD;  Location: [facility];  Service: Orthopedics;  Laterality: Left;   Thyroidectomy     TUBAL LIGATION     Social History   Occupational History   Not on file  Tobacco Use   Smoking status: Never   Smokeless tobacco: Never  Vaping Use   Vaping Use: Never used  Substance and Sexual Activity   Alcohol use: No   Drug use: No   Sexual activity: Not Currently

## 2021-03-23 DIAGNOSIS — E039 Hypothyroidism, unspecified: Secondary | ICD-10-CM | POA: Diagnosis not present

## 2021-03-23 DIAGNOSIS — Z6821 Body mass index (BMI) 21.0-21.9, adult: Secondary | ICD-10-CM | POA: Diagnosis not present

## 2021-03-23 DIAGNOSIS — R000 Tachycardia, unspecified: Secondary | ICD-10-CM | POA: Diagnosis not present

## 2021-03-23 DIAGNOSIS — I10 Essential (primary) hypertension: Secondary | ICD-10-CM | POA: Diagnosis not present

## 2021-03-23 DIAGNOSIS — M545 Low back pain, unspecified: Secondary | ICD-10-CM | POA: Diagnosis not present

## 2021-03-30 DIAGNOSIS — S233XXA Sprain of ligaments of thoracic spine, initial encounter: Secondary | ICD-10-CM | POA: Diagnosis not present

## 2021-03-30 DIAGNOSIS — M9902 Segmental and somatic dysfunction of thoracic region: Secondary | ICD-10-CM | POA: Diagnosis not present

## 2021-03-30 DIAGNOSIS — S338XXA Sprain of other parts of lumbar spine and pelvis, initial encounter: Secondary | ICD-10-CM | POA: Diagnosis not present

## 2021-03-30 DIAGNOSIS — M9903 Segmental and somatic dysfunction of lumbar region: Secondary | ICD-10-CM | POA: Diagnosis not present

## 2021-04-04 DIAGNOSIS — M9903 Segmental and somatic dysfunction of lumbar region: Secondary | ICD-10-CM | POA: Diagnosis not present

## 2021-04-04 DIAGNOSIS — S338XXA Sprain of other parts of lumbar spine and pelvis, initial encounter: Secondary | ICD-10-CM | POA: Diagnosis not present

## 2021-04-04 DIAGNOSIS — S233XXA Sprain of ligaments of thoracic spine, initial encounter: Secondary | ICD-10-CM | POA: Diagnosis not present

## 2021-04-04 DIAGNOSIS — M9902 Segmental and somatic dysfunction of thoracic region: Secondary | ICD-10-CM | POA: Diagnosis not present

## 2021-04-07 DIAGNOSIS — S233XXA Sprain of ligaments of thoracic spine, initial encounter: Secondary | ICD-10-CM | POA: Diagnosis not present

## 2021-04-07 DIAGNOSIS — M9902 Segmental and somatic dysfunction of thoracic region: Secondary | ICD-10-CM | POA: Diagnosis not present

## 2021-04-07 DIAGNOSIS — M9903 Segmental and somatic dysfunction of lumbar region: Secondary | ICD-10-CM | POA: Diagnosis not present

## 2021-04-07 DIAGNOSIS — S338XXA Sprain of other parts of lumbar spine and pelvis, initial encounter: Secondary | ICD-10-CM | POA: Diagnosis not present

## 2021-04-08 DIAGNOSIS — I10 Essential (primary) hypertension: Secondary | ICD-10-CM | POA: Diagnosis not present

## 2021-04-08 DIAGNOSIS — R002 Palpitations: Secondary | ICD-10-CM | POA: Diagnosis not present

## 2021-04-13 DIAGNOSIS — M9902 Segmental and somatic dysfunction of thoracic region: Secondary | ICD-10-CM | POA: Diagnosis not present

## 2021-04-13 DIAGNOSIS — M9903 Segmental and somatic dysfunction of lumbar region: Secondary | ICD-10-CM | POA: Diagnosis not present

## 2021-04-13 DIAGNOSIS — S233XXA Sprain of ligaments of thoracic spine, initial encounter: Secondary | ICD-10-CM | POA: Diagnosis not present

## 2021-04-13 DIAGNOSIS — S338XXA Sprain of other parts of lumbar spine and pelvis, initial encounter: Secondary | ICD-10-CM | POA: Diagnosis not present

## 2021-04-15 DIAGNOSIS — Z23 Encounter for immunization: Secondary | ICD-10-CM | POA: Diagnosis not present

## 2021-04-18 DIAGNOSIS — S338XXA Sprain of other parts of lumbar spine and pelvis, initial encounter: Secondary | ICD-10-CM | POA: Diagnosis not present

## 2021-04-18 DIAGNOSIS — M9902 Segmental and somatic dysfunction of thoracic region: Secondary | ICD-10-CM | POA: Diagnosis not present

## 2021-04-18 DIAGNOSIS — M9903 Segmental and somatic dysfunction of lumbar region: Secondary | ICD-10-CM | POA: Diagnosis not present

## 2021-04-18 DIAGNOSIS — S233XXA Sprain of ligaments of thoracic spine, initial encounter: Secondary | ICD-10-CM | POA: Diagnosis not present

## 2021-04-21 DIAGNOSIS — M9902 Segmental and somatic dysfunction of thoracic region: Secondary | ICD-10-CM | POA: Diagnosis not present

## 2021-04-21 DIAGNOSIS — S338XXA Sprain of other parts of lumbar spine and pelvis, initial encounter: Secondary | ICD-10-CM | POA: Diagnosis not present

## 2021-04-21 DIAGNOSIS — M9903 Segmental and somatic dysfunction of lumbar region: Secondary | ICD-10-CM | POA: Diagnosis not present

## 2021-04-21 DIAGNOSIS — S233XXA Sprain of ligaments of thoracic spine, initial encounter: Secondary | ICD-10-CM | POA: Diagnosis not present

## 2021-04-27 DIAGNOSIS — D702 Other drug-induced agranulocytosis: Secondary | ICD-10-CM | POA: Diagnosis not present

## 2021-04-27 DIAGNOSIS — Z6821 Body mass index (BMI) 21.0-21.9, adult: Secondary | ICD-10-CM | POA: Diagnosis not present

## 2021-04-27 DIAGNOSIS — R768 Other specified abnormal immunological findings in serum: Secondary | ICD-10-CM | POA: Diagnosis not present

## 2021-04-27 DIAGNOSIS — M0589 Other rheumatoid arthritis with rheumatoid factor of multiple sites: Secondary | ICD-10-CM | POA: Diagnosis not present

## 2021-04-27 DIAGNOSIS — M5432 Sciatica, left side: Secondary | ICD-10-CM | POA: Diagnosis not present

## 2021-04-27 DIAGNOSIS — Z79899 Other long term (current) drug therapy: Secondary | ICD-10-CM | POA: Diagnosis not present

## 2021-05-04 DIAGNOSIS — M48062 Spinal stenosis, lumbar region with neurogenic claudication: Secondary | ICD-10-CM | POA: Diagnosis not present

## 2021-05-04 DIAGNOSIS — M5451 Vertebrogenic low back pain: Secondary | ICD-10-CM | POA: Diagnosis not present

## 2021-05-04 DIAGNOSIS — M5459 Other low back pain: Secondary | ICD-10-CM | POA: Diagnosis not present

## 2021-05-18 DIAGNOSIS — M5459 Other low back pain: Secondary | ICD-10-CM | POA: Diagnosis not present

## 2021-05-18 DIAGNOSIS — M5416 Radiculopathy, lumbar region: Secondary | ICD-10-CM | POA: Diagnosis not present

## 2021-05-30 DIAGNOSIS — M5451 Vertebrogenic low back pain: Secondary | ICD-10-CM | POA: Diagnosis not present

## 2021-06-02 DIAGNOSIS — M0579 Rheumatoid arthritis with rheumatoid factor of multiple sites without organ or systems involvement: Secondary | ICD-10-CM | POA: Diagnosis not present

## 2021-06-02 DIAGNOSIS — M545 Low back pain, unspecified: Secondary | ICD-10-CM | POA: Diagnosis not present

## 2021-06-02 DIAGNOSIS — I10 Essential (primary) hypertension: Secondary | ICD-10-CM | POA: Diagnosis not present

## 2021-06-02 DIAGNOSIS — Z6821 Body mass index (BMI) 21.0-21.9, adult: Secondary | ICD-10-CM | POA: Diagnosis not present

## 2021-06-02 DIAGNOSIS — E7849 Other hyperlipidemia: Secondary | ICD-10-CM | POA: Diagnosis not present

## 2021-06-02 DIAGNOSIS — Z23 Encounter for immunization: Secondary | ICD-10-CM | POA: Diagnosis not present

## 2021-06-02 DIAGNOSIS — E039 Hypothyroidism, unspecified: Secondary | ICD-10-CM | POA: Diagnosis not present

## 2021-07-07 ENCOUNTER — Ambulatory Visit: Payer: Self-pay | Admitting: Orthopedic Surgery

## 2021-07-07 DIAGNOSIS — M48062 Spinal stenosis, lumbar region with neurogenic claudication: Secondary | ICD-10-CM

## 2021-07-07 DIAGNOSIS — R29898 Other symptoms and signs involving the musculoskeletal system: Secondary | ICD-10-CM

## 2021-07-15 ENCOUNTER — Ambulatory Visit: Payer: Self-pay | Admitting: Orthopedic Surgery

## 2021-07-15 NOTE — H&P (Signed)
Deborah DredgeSally L Kohlmann is an 76 y.o. female.   Chief Complaint: back and leg pain HPI: Reason for Visit: (normal) review of test results (lumbar MRI); 19 months Location (Lower Extremity): lower back pain on the left; left lower leg and occasional right lower leg pain Severity: pain level 5/10 Quality: burning Aggravating Factors: standing for Associated Symptoms: numbness/tingling (LLE) Medications: helping a little; Tylenol prn Limited ability to ambulate. Mainly pain goes down her left leg and also into her right. She is tried physical therapy she is also had steroid Dosepaks. She has to bend over when she walks. She has to sit on a stool to do dishes  Past Medical History:  Diagnosis Date   Arthritis    rheumatoid arthritis   Hypertension    Hypothyroidism 2009   Thyroid disease     Past Surgical History:  Procedure Laterality Date   APPENDECTOMY     per patient 1970s   COLONOSCOPY     COLONOSCOPY N/A 05/18/2015   Procedure: COLONOSCOPY;  Surgeon: Corbin Adeobert M Rourk, MD;  [facility];  Service: Endoscopy;  Laterality: N/A;  11:30   EYE SURGERY Bilateral 2021   cataract surgery   HARDWARE REMOVAL Left 12/22/2020   Procedure: HARDWARE REMOVAL LEFT ANKLE;  Surgeon: Roby LoftsHaddix, Kevin P, MD;  Location: [facility];  Service: Orthopedics;  Laterality: Left;   OOPHORECTOMY     ORIF ANKLE FRACTURE Left 06/24/2020   Procedure: OPEN REDUCTION INTERNAL FIXATION (ORIF) ANKLE FRACTURE;  Surgeon: Roby LoftsHaddix, Kevin P, MD;  Location: [facility];  Service: Orthopedics;  Laterality: Left;   Thyroidectomy     TUBAL LIGATION      No family history on file. Social History:  reports that she has never smoked. She has never used smokeless tobacco. She reports that she does not drink alcohol and does not use drugs.  Allergies:  Allergies  Allergen Reactions   Orencia [Abatacept]     Coughing, palm of hands itch    Current meds: chlorthalidone 25 mg tablet hydrOXYchloroQUINE 200 mg tablet Lagevrio 200 mg  capsule (EUA) leflunomide 20 mg tablet levothyroxine 125 mcg tablet losartan 100 mg tablet meloxicam 7.5 mg tablet metoprolol succinate ER 50 mg tablet,extended release 24 hr potassium chloride ER 20 mEq tablet,extended release  Review of Systems  Constitutional: Negative.   HENT: Negative.    Eyes: Negative.   Respiratory: Negative.    Cardiovascular: Negative.   Gastrointestinal: Negative.   Endocrine: Negative.   Genitourinary: Negative.   Musculoskeletal:  Positive for back pain and gait problem.  Skin: Negative.   Neurological:  Positive for weakness and numbness.   There were no vitals taken for this visit. Physical Exam Constitutional:      Appearance: Normal appearance.  HENT:     Head: Normocephalic.     Right Ear: External ear normal.     Left Ear: External ear normal.     Nose: Nose normal.     Mouth/Throat:     Pharynx: Oropharynx is clear.  Eyes:     Conjunctiva/sclera: Conjunctivae normal.  Cardiovascular:     Rate and Rhythm: Normal rate and regular rhythm.     Pulses: Normal pulses.  Pulmonary:     Effort: Pulmonary effort is normal.     Breath sounds: Normal breath sounds.  Abdominal:     General: Bowel sounds are normal.  Musculoskeletal:     Cervical back: Normal range of motion.     Comments: Upright no distress straight leg raise low  back pain buttock pain on the left. Presents trace EHL weakness left compared to the right. But no DVT.  °Skin: °   General: Skin is warm and dry.  °Neurological:  °   Mental Status: She is alert.  °  °MRI lumbar spine demonstrates severe spinal stenosis at L4-5 with a right synovial cyst. And severe lateral recess stenosis and foraminal narrowing effacing the 5 roots in the 4 roots. Bulging disc at L5-S1 facet arthrosis L5-S1 the left. Foraminal narrowing severe left on the L5. Degenerative changes at L3-4. A small disc protrusion at L3-4. Moderate stenosis. ° °Lateral flexion-extension x-rays of the cervical spine  demonstrates no instability at C1-C2. There is disc degeneration at C6-7 ° °Assessment/Plan °Impression: ° °1. Neurogenic claudication secondary to severe spinal stenosis at L4-5 multifactorial. Ligamentum flavum hypertrophy facet hypertrophy. Synovial cyst from the facet on the right. Foraminal stenosis L4 and L5. Moderate stenosis at L3-4. Moderately severe stenosis at L5-S1 on the left. °Refractory to rest activity modification. Physical therapy and steroid Dosepaks. ° ° °Plan: ° °I do not feel epidural steroid series will be therapeutic. If anything just further attenuate the thecal sac. We discussed living with her symptoms and modifying her activities which she had versus lumbar decompression. She would like to proceed with that. Most likely will require L4-5 laminectomy of L4 and potentially L5-S1 on the left. ° °I had an extensive discussion with the patient concerning the pathology relevant anatomy and treatment options. At this point exhausting conservative treatment and in the presence of a neurologic deficit we discussed microlumbar decompression. I discussed the risks and benefits including bleeding, infection, DVT, PE, anesthetic complications, worsening in their symptoms, improvement in their symptoms, C SF leakage, epidural fibrosis, need for future surgeries such as revision discectomy and lumbar fusion. I also indicated that this is an operation to basically decompress the nerve roots to allow recovery as opposed to fixing a herniated disc if it is encountered and that the incidence of recurrent chest disc herniation can approach 15%. Also that nerve root recovery is variable and may not recover completely. Any ligament or bone that is contributing to compressing the nerves will be removed as well. ° °I discussed the operative course including overnight in the hospital. Immediate ambulation. Follow-up in 2 weeks for suture removal. 6 weeks until healing of the herniation and surgical incision  followed by 6 weeks of reconditioning and strengthening of the core musculature. Also discussed the need to employ the concepts of disc pressure management and core motion following the surgery to minimize the risk of recurrent disc herniation. We will obtain preoperative clearance i if necessary and proceed accordingly. ° °Preoperative clearance as she recently has had an issue with her blood pressure. ° °Plan microlumbar decompression L4-5 possible L3-4 excision of synovial cyst ° °Valyn Latchford M Gaynor Ferreras, PA-C for Dr Beane °07/15/2021, 11:42 AM ° ° ° °

## 2021-07-15 NOTE — H&P (View-Only) (Signed)
Deborah DredgeSally L Kerr is an 76 y.o. female.   Chief Complaint: back and leg pain HPI: Reason for Visit: (normal) review of test results (lumbar MRI); 19 months Location (Lower Extremity): lower back pain on the left; left lower leg and occasional right lower leg pain Severity: pain level 5/10 Quality: burning Aggravating Factors: standing for Associated Symptoms: numbness/tingling (LLE) Medications: helping a little; Tylenol prn Limited ability to ambulate. Mainly pain goes down her left leg and also into her right. She is tried physical therapy she is also had steroid Dosepaks. She has to bend over when she walks. She has to sit on a stool to do dishes  Past Medical History:  Diagnosis Date   Arthritis    rheumatoid arthritis   Hypertension    Hypothyroidism 2009   Thyroid disease     Past Surgical History:  Procedure Laterality Date   APPENDECTOMY     per patient 1970s   COLONOSCOPY     COLONOSCOPY N/A 05/18/2015   Procedure: COLONOSCOPY;  Surgeon: Corbin Adeobert M Rourk, MD;  [facility];  Service: Endoscopy;  Laterality: N/A;  11:30   EYE SURGERY Bilateral 2021   cataract surgery   HARDWARE REMOVAL Left 12/22/2020   Procedure: HARDWARE REMOVAL LEFT ANKLE;  Surgeon: Roby LoftsHaddix, Kevin P, MD;  Location: [facility];  Service: Orthopedics;  Laterality: Left;   OOPHORECTOMY     ORIF ANKLE FRACTURE Left 06/24/2020   Procedure: OPEN REDUCTION INTERNAL FIXATION (ORIF) ANKLE FRACTURE;  Surgeon: Roby LoftsHaddix, Kevin P, MD;  Location: [facility];  Service: Orthopedics;  Laterality: Left;   Thyroidectomy     TUBAL LIGATION      No family history on file. Social History:  reports that she has never smoked. She has never used smokeless tobacco. She reports that she does not drink alcohol and does not use drugs.  Allergies:  Allergies  Allergen Reactions   Orencia [Abatacept]     Coughing, palm of hands itch    Current meds: chlorthalidone 25 mg tablet hydrOXYchloroQUINE 200 mg tablet Lagevrio 200 mg  capsule (EUA) leflunomide 20 mg tablet levothyroxine 125 mcg tablet losartan 100 mg tablet meloxicam 7.5 mg tablet metoprolol succinate ER 50 mg tablet,extended release 24 hr potassium chloride ER 20 mEq tablet,extended release  Review of Systems  Constitutional: Negative.   HENT: Negative.    Eyes: Negative.   Respiratory: Negative.    Cardiovascular: Negative.   Gastrointestinal: Negative.   Endocrine: Negative.   Genitourinary: Negative.   Musculoskeletal:  Positive for back pain and gait problem.  Skin: Negative.   Neurological:  Positive for weakness and numbness.   There were no vitals taken for this visit. Physical Exam Constitutional:      Appearance: Normal appearance.  HENT:     Head: Normocephalic.     Right Ear: External ear normal.     Left Ear: External ear normal.     Nose: Nose normal.     Mouth/Throat:     Pharynx: Oropharynx is clear.  Eyes:     Conjunctiva/sclera: Conjunctivae normal.  Cardiovascular:     Rate and Rhythm: Normal rate and regular rhythm.     Pulses: Normal pulses.  Pulmonary:     Effort: Pulmonary effort is normal.     Breath sounds: Normal breath sounds.  Abdominal:     General: Bowel sounds are normal.  Musculoskeletal:     Cervical back: Normal range of motion.     Comments: Upright no distress straight leg raise low  back pain buttock pain on the left. Presents trace EHL weakness left compared to the right. But no DVT.  Skin:    General: Skin is warm and dry.  Neurological:     Mental Status: She is alert.    MRI lumbar spine demonstrates severe spinal stenosis at L4-5 with a right synovial cyst. And severe lateral recess stenosis and foraminal narrowing effacing the 5 roots in the 4 roots. Bulging disc at L5-S1 facet arthrosis L5-S1 the left. Foraminal narrowing severe left on the L5. Degenerative changes at L3-4. A small disc protrusion at L3-4. Moderate stenosis.  Lateral flexion-extension x-rays of the cervical spine  demonstrates no instability at C1-C2. There is disc degeneration at C6-7  Assessment/Plan Impression:  1. Neurogenic claudication secondary to severe spinal stenosis at L4-5 multifactorial. Ligamentum flavum hypertrophy facet hypertrophy. Synovial cyst from the facet on the right. Foraminal stenosis L4 and L5. Moderate stenosis at L3-4. Moderately severe stenosis at L5-S1 on the left. Refractory to rest activity modification. Physical therapy and steroid Dosepaks.   Plan:  I do not feel epidural steroid series will be therapeutic. If anything just further attenuate the thecal sac. We discussed living with her symptoms and modifying her activities which she had versus lumbar decompression. She would like to proceed with that. Most likely will require L4-5 laminectomy of L4 and potentially L5-S1 on the left.  I had an extensive discussion with the patient concerning the pathology relevant anatomy and treatment options. At this point exhausting conservative treatment and in the presence of a neurologic deficit we discussed microlumbar decompression. I discussed the risks and benefits including bleeding, infection, DVT, PE, anesthetic complications, worsening in their symptoms, improvement in their symptoms, C SF leakage, epidural fibrosis, need for future surgeries such as revision discectomy and lumbar fusion. I also indicated that this is an operation to basically decompress the nerve roots to allow recovery as opposed to fixing a herniated disc if it is encountered and that the incidence of recurrent chest disc herniation can approach 15%. Also that nerve root recovery is variable and may not recover completely. Any ligament or bone that is contributing to compressing the nerves will be removed as well.  I discussed the operative course including overnight in the hospital. Immediate ambulation. Follow-up in 2 weeks for suture removal. 6 weeks until healing of the herniation and surgical incision  followed by 6 weeks of reconditioning and strengthening of the core musculature. Also discussed the need to employ the concepts of disc pressure management and core motion following the surgery to minimize the risk of recurrent disc herniation. We will obtain preoperative clearance i if necessary and proceed accordingly.  Preoperative clearance as she recently has had an issue with her blood pressure.  Plan microlumbar decompression L4-5 possible L3-4 excision of synovial cyst  Dorothy Spark, PA-C for Dr Shelle Iron 07/15/2021, 11:42 AM

## 2021-07-15 NOTE — Pre-Procedure Instructions (Addendum)
Surgical Instructions    Your procedure is scheduled on Thursday, July 21, 2021 at 10:41 AM.  Report to [facility] Main Entrance "A" at 8:40 A.M., then check in with the Admitting office.  Call this number if you have problems the morning of surgery:  [phone]   If you have any questions prior to your surgery date call [phone]: Open Monday-Friday 8am-4pm    Remember:  Do not eat after midnight the night before your surgery  You may drink clear liquids until 7:40 AM the morning of your surgery.   Clear liquids allowed are: Water, Non-Citrus Juices (without pulp), Carbonated Beverages, Clear Tea, Black Coffee Only, and Gatorade  Please complete your PRE-SURGERY ENSURE that was provided to you by 7:40 AM the morning of surgery.  Please, if able, drink it in one setting. DO NOT SIP.     Take these medicines the morning of surgery with A SIP OF WATER:  hydroxychloroquine (PLAQUENIL) leflunomide (ARAVA) levothyroxine (SYNTHROID) metoprolol succinate (TOPROL-XL) acetaminophen (TYLENOL) - if needed   As of today, STOP taking any Aspirin (unless otherwise instructed by your surgeon) Aleve, Naproxen, Ibuprofen, Motrin, Advil, Goody's, BC's, all herbal medications, fish oil, and all vitamins.                     Do NOT Smoke (Tobacco/Vaping) or drink Alcohol 24 hours prior to your procedure.  If you use a CPAP at night, you may bring all equipment for your overnight stay.   Contacts, glasses, piercing's, hearing aid's, dentures or partials may not be worn into surgery, please bring cases for these belongings.    For patients admitted to the hospital, discharge time will be determined by your treatment team.   Patients discharged the day of surgery will not be allowed to drive home, and someone needs to stay with them for 24 hours.  NO VISITORS WILL BE ALLOWED IN PRE-OP WHERE PATIENTS GET READY FOR SURGERY.  ONLY 1 SUPPORT PERSON MAY BE PRESENT IN THE WAITING ROOM WHILE YOU  ARE IN SURGERY.  IF YOU ARE TO BE ADMITTED, ONCE YOU ARE IN YOUR ROOM YOU WILL BE ALLOWED TWO (2) VISITORS.  Minor children may have two parents present. Special consideration for safety and communication needs will be reviewed on a case by case basis.   Special instructions:   [place]- Preparing For Surgery  Before surgery, you can play an important role. Because skin is not sterile, your skin needs to be as free of germs as possible. You can reduce the number of germs on your skin by washing with CHG (chlorahexidine gluconate) Soap before surgery.  CHG is an antiseptic cleaner which kills germs and bonds with the skin to continue killing germs even after washing.    Oral Hygiene is also important to reduce your risk of infection.  Remember - BRUSH YOUR TEETH THE MORNING OF SURGERY WITH YOUR REGULAR TOOTHPASTE  Please do not use if you have an allergy to CHG or antibacterial soaps. If your skin becomes reddened/irritated stop using the CHG.  Do not shave (including legs and underarms) for at least 48 hours prior to first CHG shower. It is OK to shave your face.  Please follow these instructions carefully.   Shower the NIGHT BEFORE SURGERY and the MORNING OF SURGERY  If you chose to wash your hair, wash your hair first as usual with your normal shampoo.  After you shampoo, rinse your hair and body thoroughly to remove the shampoo.  Use CHG Soap as you would any other liquid soap. You can apply CHG directly to the skin and wash gently with a scrungie or a clean washcloth.   Apply the CHG Soap to your body ONLY FROM THE NECK DOWN.  Do not use on open wounds or open sores. Avoid contact with your eyes, ears, mouth and genitals (private parts). Wash Face and genitals (private parts)  with your normal soap.   Wash thoroughly, paying special attention to the area where your surgery will be performed.  Thoroughly rinse your body with warm water from the neck down.  DO NOT shower/wash with  your normal soap after using and rinsing off the CHG Soap.  Pat yourself dry with a CLEAN TOWEL.  Wear CLEAN PAJAMAS to bed the night before surgery  Place CLEAN SHEETS on your bed the night before your surgery  DO NOT SLEEP WITH PETS.   Day of Surgery: Shower with CHG soap. Do not wear jewelry, make up, nail polish, gel polish, artificial nails, or any other type of covering on natural nails including finger and toenails. If patients have artificial nails, gel coating, etc. that need to be removed by a nail salon please have this removed prior to surgery. Surgery may need to be canceled/delayed if the surgeon/ anesthesia feels like the patient is unable to be adequately monitored. Do not wear lotions, powders, perfumes, or deodorant. Do not shave 48 hours prior to surgery.   Do not bring valuables to the hospital. [facility] is not responsible for any belongings or valuables. Wear Clean/Comfortable clothing the morning of surgery Remember to brush your teeth WITH YOUR REGULAR TOOTHPASTE.   Please read over the following fact sheets that you were given.   3 days prior to your procedure or After your COVID test   You are not required to quarantine however you are required to wear a well-fitting mask when you are out and around people not in your household. If your mask becomes wet or soiled, replace with a new one.   Wash your hands often with soap and water for 20 seconds or clean your hands with an alcohol-based hand sanitizer that contains at least 60% alcohol.   Do not share personal items.   Notify your provider:  o if you are in close contact with someone who has COVID  o or if you develop a fever of 100.4 or greater, sneezing, cough, sore throat, shortness of breath or body aches.

## 2021-07-18 ENCOUNTER — Encounter (HOSPITAL_COMMUNITY): Payer: Self-pay

## 2021-07-18 ENCOUNTER — Other Ambulatory Visit: Payer: Self-pay

## 2021-07-18 ENCOUNTER — Encounter (HOSPITAL_COMMUNITY)
Admission: RE | Admit: 2021-07-18 | Discharge: 2021-07-18 | Disposition: A | Discharge status: Home / Self Care | Payer: Medicare PPO | Source: Ambulatory Visit | Attending: Specialist | Admitting: Specialist

## 2021-07-18 ENCOUNTER — Ambulatory Visit (HOSPITAL_COMMUNITY)
Admission: RE | Admit: 2021-07-18 | Discharge: 2021-07-18 | Disposition: A | Discharge status: Home / Self Care | Payer: Medicare PPO | Source: Ambulatory Visit | Attending: Orthopedic Surgery | Admitting: Orthopedic Surgery

## 2021-07-18 VITALS — HR 56 | Temp 97.7°F | Resp 18 | Ht 62.0 in | Wt 122.6 lb

## 2021-07-18 DIAGNOSIS — I447 Left bundle-branch block, unspecified: Secondary | ICD-10-CM | POA: Diagnosis not present

## 2021-07-18 DIAGNOSIS — Z79899 Other long term (current) drug therapy: Secondary | ICD-10-CM | POA: Diagnosis not present

## 2021-07-18 DIAGNOSIS — Z20822 Contact with and (suspected) exposure to covid-19: Secondary | ICD-10-CM | POA: Insufficient documentation

## 2021-07-18 DIAGNOSIS — R29898 Other symptoms and signs involving the musculoskeletal system: Secondary | ICD-10-CM | POA: Insufficient documentation

## 2021-07-18 DIAGNOSIS — M5136 Other intervertebral disc degeneration, lumbar region: Secondary | ICD-10-CM | POA: Insufficient documentation

## 2021-07-18 DIAGNOSIS — M48062 Spinal stenosis, lumbar region with neurogenic claudication: Secondary | ICD-10-CM

## 2021-07-18 DIAGNOSIS — Z01818 Encounter for other preprocedural examination: Secondary | ICD-10-CM

## 2021-07-18 DIAGNOSIS — M5126 Other intervertebral disc displacement, lumbar region: Secondary | ICD-10-CM | POA: Insufficient documentation

## 2021-07-18 DIAGNOSIS — M4185 Other forms of scoliosis, thoracolumbar region: Secondary | ICD-10-CM | POA: Diagnosis not present

## 2021-07-18 DIAGNOSIS — M47819 Spondylosis without myelopathy or radiculopathy, site unspecified: Secondary | ICD-10-CM | POA: Diagnosis not present

## 2021-07-18 DIAGNOSIS — I10 Essential (primary) hypertension: Secondary | ICD-10-CM | POA: Diagnosis not present

## 2021-07-18 HISTORY — DX: Left bundle-branch block, unspecified: I44.7

## 2021-07-18 LAB — CBC
HCT: 40.1 % (ref 36.0–46.0)
Hemoglobin: 13.3 g/dL (ref 12.0–15.0)
MCH: 31.4 pg (ref 26.0–34.0)
MCHC: 33.2 g/dL (ref 30.0–36.0)
MCV: 94.8 fL (ref 80.0–100.0)
Platelets: 234 10*3/uL (ref 150–400)
RBC: 4.23 MIL/uL (ref 3.87–5.11)
RDW: 13.2 % (ref 11.5–15.5)
WBC: 5.3 10*3/uL (ref 4.0–10.5)
nRBC: 0 % (ref 0.0–0.2)

## 2021-07-18 LAB — SURGICAL PCR SCREEN
MRSA, PCR: NEGATIVE
Staphylococcus aureus: NEGATIVE

## 2021-07-18 LAB — BASIC METABOLIC PANEL
Anion gap: 9 (ref 5–15)
BUN: 14 mg/dL (ref 8–23)
CO2: 27 mmol/L (ref 22–32)
Calcium: 8.3 mg/dL — ABNORMAL LOW (ref 8.9–10.3)
Chloride: 101 mmol/L (ref 98–111)
Creatinine, Ser: 0.79 mg/dL (ref 0.44–1.00)
GFR, Estimated: >60 mL/min (ref >60)
Glucose, Bld: 95 mg/dL (ref 70–99)
Potassium: 3.5 mmol/L (ref 3.5–5.1)
Sodium: 137 mmol/L (ref 135–145)

## 2021-07-18 LAB — SARS CORONAVIRUS 2 (TAT 6-24 HRS): SARS Coronavirus 2: NEGATIVE

## 2021-07-18 NOTE — Progress Notes (Signed)
PCP - Dr. Consuello Masse Cardiologist - denies  PPM/ICD - denies   Chest x-ray - 07/18/21 at PAT EKG - 07/18/21 at PAT Stress Test - denies ECHO - denies Cardiac Cath - denies  Sleep Study - denies   DM- denies  Blood Thinner Instructions: n/a Aspirin Instructions: n/a  ERAS Protcol - yes PRE-SURGERY Ensure given at PAT  COVID TEST- 07/18/21 at PAT   Anesthesia review: yes, pt is hypertensive at PAT. First manual check at beginning of appt was 200/113. Last manual check at end of appt was 160/110. Jeneen Rinks, Anesthesia PA notified. Pt instructed to check BP at home and keep a log. Jeneen Rinks will f/u with her in regards to this and will let her know if she needs to contact her PCP.   Patient denies shortness of breath, fever, cough and chest pain at PAT appointment   All instructions explained to the patient, with a verbal understanding of the material. Patient agrees to go over the instructions while at home for a better understanding. Patient also instructed to wear a mask in public after being tested for COVID-19. The opportunity to ask questions was provided.

## 2021-07-18 NOTE — Pre-Procedure Instructions (Signed)
Surgical Instructions    Your procedure is scheduled on Thursday, July 21, 2021 at 07:30 AM.  Report to [facility] Main Entrance "A" at 05:30 A.M., then check in with the Admitting office.  Call this number if you have problems the morning of surgery:  [phone]   If you have any questions prior to your surgery date call [phone]: Open Monday-Friday 8am-4pm    Remember:  Do not eat after midnight the night before your surgery  You may drink clear liquids until 04:30 AM the morning of your surgery.   Clear liquids allowed are: Water, Non-Citrus Juices (without pulp), Carbonated Beverages, Clear Tea, Black Coffee Only, and Gatorade  Please complete your PRE-SURGERY ENSURE that was provided to you by 04:30 AM the morning of surgery.  Please, if able, drink it in one setting. DO NOT SIP.     Take these medicines the morning of surgery with A SIP OF WATER:  hydroxychloroquine (PLAQUENIL) leflunomide (ARAVA) levothyroxine (SYNTHROID) metoprolol succinate (TOPROL-XL) acetaminophen (TYLENOL) - if needed   As of today, STOP taking any Aspirin (unless otherwise instructed by your surgeon) Aleve, Naproxen, Ibuprofen, Motrin, Advil, Goody's, BC's, all herbal medications, fish oil, and all vitamins.                     Do NOT Smoke (Tobacco/Vaping) or drink Alcohol 24 hours prior to your procedure.  If you use a CPAP at night, you may bring all equipment for your overnight stay.   Contacts, glasses, piercing's, hearing aid's, dentures or partials may not be worn into surgery, please bring cases for these belongings.    For patients admitted to the hospital, discharge time will be determined by your treatment team.   Patients discharged the day of surgery will not be allowed to drive home, and someone needs to stay with them for 24 hours.  NO VISITORS WILL BE ALLOWED IN PRE-OP WHERE PATIENTS GET READY FOR SURGERY.  ONLY 1 SUPPORT PERSON MAY BE PRESENT IN THE WAITING ROOM WHILE  YOU ARE IN SURGERY.  IF YOU ARE TO BE ADMITTED, ONCE YOU ARE IN YOUR ROOM YOU WILL BE ALLOWED TWO (2) VISITORS.  Minor children may have two parents present. Special consideration for safety and communication needs will be reviewed on a case by case basis.   Special instructions:   [facility]- Preparing For Surgery  Before surgery, you can play an important role. Because skin is not sterile, your skin needs to be as free of germs as possible. You can reduce the number of germs on your skin by washing with CHG (chlorahexidine gluconate) Soap before surgery.  CHG is an antiseptic cleaner which kills germs and bonds with the skin to continue killing germs even after washing.    Oral Hygiene is also important to reduce your risk of infection.  Remember - BRUSH YOUR TEETH THE MORNING OF SURGERY WITH YOUR REGULAR TOOTHPASTE  Please do not use if you have an allergy to CHG or antibacterial soaps. If your skin becomes reddened/irritated stop using the CHG.  Do not shave (including legs and underarms) for at least 48 hours prior to first CHG shower. It is OK to shave your face.  Please follow these instructions carefully.   Shower the NIGHT BEFORE SURGERY and the MORNING OF SURGERY  If you chose to wash your hair, wash your hair first as usual with your normal shampoo.  After you shampoo, rinse your hair and body thoroughly to remove the shampoo.  Use CHG Soap as you would any other liquid soap. You can apply CHG directly to the skin and wash gently with a scrungie or a clean washcloth.   Apply the CHG Soap to your body ONLY FROM THE NECK DOWN.  Do not use on open wounds or open sores. Avoid contact with your eyes, ears, mouth and genitals (private parts). Wash Face and genitals (private parts)  with your normal soap.   Wash thoroughly, paying special attention to the area where your surgery will be performed.  Thoroughly rinse your body with warm water from the neck down.  DO NOT shower/wash  with your normal soap after using and rinsing off the CHG Soap.  Pat yourself dry with a CLEAN TOWEL.  Wear CLEAN PAJAMAS to bed the night before surgery  Place CLEAN SHEETS on your bed the night before your surgery  DO NOT SLEEP WITH PETS.   Day of Surgery: Shower with CHG soap. Do not wear jewelry, make up, nail polish, gel polish, artificial nails, or any other type of covering on natural nails including finger and toenails. If patients have artificial nails, gel coating, etc. that need to be removed by a nail salon please have this removed prior to surgery. Surgery may need to be canceled/delayed if the surgeon/ anesthesia feels like the patient is unable to be adequately monitored. Do not wear lotions, powders, perfumes, or deodorant. Do not shave 48 hours prior to surgery.   Do not bring valuables to the hospital. [facility] is not responsible for any belongings or valuables. Wear Clean/Comfortable clothing the morning of surgery Remember to brush your teeth WITH YOUR REGULAR TOOTHPASTE.   Please read over the following fact sheets that you were given.   3 days prior to your procedure or After your COVID test   You are not required to quarantine however you are required to wear a well-fitting mask when you are out and around people not in your household. If your mask becomes wet or soiled, replace with a new one.   Wash your hands often with soap and water for 20 seconds or clean your hands with an alcohol-based hand sanitizer that contains at least 60% alcohol.   Do not share personal items.   Notify your provider:  o if you are in close contact with someone who has COVID  o or if you develop a fever of 100.4 or greater, sneezing, cough, sore throat, shortness of breath or body aches.

## 2021-07-20 ENCOUNTER — Encounter (HOSPITAL_COMMUNITY): Payer: Self-pay

## 2021-07-20 NOTE — Anesthesia Preprocedure Evaluation (Addendum)
Anesthesia Evaluation  Patient identified by MRN, date of birth, ID band Patient awake    Reviewed: Allergy & Precautions, NPO status , Patient's Chart, lab work & pertinent test results  Airway Mallampati: I       Dental no notable dental hx.    Pulmonary neg pulmonary ROS,    Pulmonary exam normal        Cardiovascular hypertension, Pt. on medications and Pt. on home beta blockers Normal cardiovascular exam     Neuro/Psych negative neurological ROS  negative psych ROS   GI/Hepatic negative GI ROS, Neg liver ROS,   Endo/Other  Hypothyroidism   Renal/GU negative Renal ROS  negative genitourinary   Musculoskeletal  (+) Arthritis , Rheumatoid disorders,    Abdominal Normal abdominal exam  (+)   Peds  Hematology negative hematology ROS (+)   Anesthesia Other Findings   Reproductive/Obstetrics                           Anesthesia Physical Anesthesia Plan  ASA: 2  Anesthesia Plan: General   Post-op Pain Management:    Induction: Intravenous  PONV Risk Score and Plan: Ondansetron, Dexamethasone and Treatment may vary due to age or medical condition  Airway Management Planned: Oral ETT  Additional Equipment: None  Intra-op Plan:   Post-operative Plan: Extubation in OR  Informed Consent: I have reviewed the patients History and Physical, chart, labs and discussed the procedure including the risks, benefits and alternatives for the proposed anesthesia with the patient or authorized representative who has indicated his/her understanding and acceptance.     Dental advisory given  Plan Discussed with: CRNA  Anesthesia Plan Comments: (PAT note by Antionette Poles, PA-C:  Patient's blood pressure noted to be elevated at preop testing appointment.  200/113 on arrival and 160/110 on recheck.  Patient reported compliance with antihypertensive medications.  She has blood pressure cuff at home  but is not been checking recently.  She was advised to check 2-3 times per day over the next couple days and reach out to her PCP Dr. Fara Chute if it remains similarly elevated.  She denies any symptoms of chest pain, headache, shortness of breath.  I did call the patient on 07/20/2021 to follow-up.  She reported she took her blood pressure 3 times on 07/19/2021 with systolic ranging from 153-192 and diastolic ranging from 83-97.  She took her blood pressure once this morning 07/20/2021 and recorded 150/96.  She says that these numbers are generally higher than they had been when she was previously checking more regularly.  She does admit to some dietary indiscretion.  She says last time she was seen by Dr. Neita Carp at the beginning of December it was better controlled and she was cleared to have surgery at that time.  She understands that if her pressure is prohibitively high on day of surgery procedure could be canceled.  I did review notes from PCP Dr. Neita Carp dated 06/02/2021.  Blood pressure recorded as 154/80 at that time and she was cleared to proceed with surgery.  Copy on chart.  Preop labs reviewed, unremarkable.  EKG 07/18/2021: NSR.  Rate 74.  LAD.  Left bundle branch block.  No significant change from tracing 06/24/2020 in epic and tracing 03/23/2021 from PCP office (copy on chart). )       Anesthesia Quick Evaluation  Anesthesia Evaluation  Patient identified by MRN, date of birth, ID band Patient awake    Reviewed: Allergy & Precautions, NPO status , Patient's Chart, lab work & pertinent test results  Airway Mallampati: II  TM Distance: >3 FB Neck ROM: Full    Dental no notable dental hx.    Pulmonary neg pulmonary ROS,    Pulmonary exam normal breath sounds clear to auscultation       Cardiovascular hypertension, Pt. on medications Normal cardiovascular exam Rhythm:Regular Rate:Normal  ECG: NSR, rate 85. LAD    Neuro/Psych negative neurological ROS  negative psych ROS   GI/Hepatic negative GI ROS, Neg liver ROS,   Endo/Other  Hypothyroidism   Renal/GU negative Renal ROS     Musculoskeletal  (+) Arthritis , Rheumatoid disorders,    Abdominal   Peds  Hematology negative hematology ROS (+)   Anesthesia Other Findings Left ankle swelling and pain  Reproductive/Obstetrics                             Anesthesia Physical Anesthesia Plan  ASA: 2  Anesthesia Plan: General   Post-op Pain Management:    Induction: Intravenous  PONV Risk Score and Plan: 3 and Ondansetron, Dexamethasone, Midazolam and Treatment may vary due to age or medical condition  Airway Management Planned: LMA  Additional Equipment:   Intra-op Plan:   Post-operative Plan: Extubation in OR  Informed Consent: I have reviewed the patients History and Physical, chart, labs and discussed the procedure including the risks, benefits and alternatives for the proposed anesthesia with the patient or authorized representative who has indicated his/her understanding and acceptance.     Dental advisory given  Plan Discussed with: CRNA  Anesthesia Plan Comments: (Potential regional anesthesia discussed)       Anesthesia Quick Evaluation                                   Anesthesia Evaluation  Patient identified by MRN, date of birth, ID band Patient awake    Reviewed: Allergy & Precautions, NPO status , Patient's Chart, lab work & pertinent test results  Airway Mallampati: II  TM Distance: >3 FB Neck ROM: Full    Dental no notable dental hx.    Pulmonary neg pulmonary ROS,    Pulmonary exam normal breath sounds clear to auscultation       Cardiovascular hypertension, Pt. on medications Normal cardiovascular exam Rhythm:Regular Rate:Normal  ECG: NSR, rate 85. LAD   Neuro/Psych negative neurological ROS  negative psych ROS   GI/Hepatic negative GI ROS,  Neg liver ROS,   Endo/Other  Hypothyroidism   Renal/GU negative Renal ROS     Musculoskeletal  (+) Arthritis , Rheumatoid disorders,    Abdominal   Peds  Hematology negative hematology ROS (+)   Anesthesia Other Findings Left ankle swelling and pain  Reproductive/Obstetrics                             Anesthesia Physical Anesthesia Plan  ASA: 2  Anesthesia Plan: General   Post-op Pain Management:    Induction: Intravenous  PONV Risk Score and Plan: 3 and Ondansetron, Dexamethasone, Midazolam and Treatment may vary due to age or medical condition  Airway Management Planned: LMA  Additional Equipment:   Intra-op Plan:   Post-operative Plan: Extubation in OR  Informed Consent: I have reviewed the patients History and Physical, chart, labs and discussed the procedure including the risks, benefits and alternatives for the proposed anesthesia with the patient or authorized representative who has indicated his/her understanding and acceptance.     Dental advisory given  Plan Discussed with: CRNA  Anesthesia Plan Comments: (Potential regional anesthesia discussed)       Anesthesia Quick Evaluation

## 2021-07-20 NOTE — Progress Notes (Signed)
Anesthesia Chart Review:  Patient's blood pressure noted to be elevated at preop testing appointment.  200/113 on arrival and 160/110 on recheck.  Patient reported compliance with antihypertensive medications.  She has blood pressure cuff at home but is not been checking recently.  She was advised to check 2-3 times per day over the next couple days and reach out to her PCP Dr. Consuello Masse if it remains similarly elevated.  She denies any symptoms of chest pain, headache, shortness of breath.  I did call the patient on 07/20/2021 to follow-up.  She reported she took her blood pressure 3 times on 99991111 with systolic ranging from 99991111 and diastolic ranging from 123456.  She took her blood pressure once this morning 07/20/2021 and recorded 150/96.  She says that these numbers are generally higher than they had been when she was previously checking more regularly.  She does admit to some dietary indiscretion.  She says last time she was seen by Dr. Quintin Alto at the beginning of December it was better controlled and she was cleared to have surgery at that time.  She understands that if her pressure is prohibitively high on day of surgery procedure could be canceled.  I did review notes from PCP Dr. Quintin Alto dated 06/02/2021.  Blood pressure recorded as 154/80 at that time and she was cleared to proceed with surgery.  Copy on chart.  Preop labs reviewed, unremarkable.  EKG 07/18/2021: NSR.  Rate 74.  LAD.  Left bundle branch block.  No significant change from tracing 06/24/2020 in epic and tracing 03/23/2021 from PCP office (copy on chart).    Wynonia Musty [facility]/Anesthesiology Phone [phone] 07/20/2021 3:08 PM

## 2021-07-21 ENCOUNTER — Encounter (HOSPITAL_COMMUNITY): Payer: Self-pay | Admitting: Specialist

## 2021-07-21 ENCOUNTER — Ambulatory Visit (HOSPITAL_COMMUNITY): Payer: Medicare PPO | Admitting: Physician Assistant

## 2021-07-21 ENCOUNTER — Encounter (HOSPITAL_COMMUNITY)
Admission: RE | Disposition: A | Discharge status: Home / Self Care | Payer: Self-pay | Source: Ambulatory Visit | Attending: Specialist

## 2021-07-21 ENCOUNTER — Other Ambulatory Visit: Payer: Self-pay

## 2021-07-21 ENCOUNTER — Ambulatory Visit (HOSPITAL_COMMUNITY): Payer: Medicare PPO | Admitting: Certified Registered Nurse Anesthetist

## 2021-07-21 ENCOUNTER — Ambulatory Visit (HOSPITAL_COMMUNITY)
Admission: RE | Admit: 2021-07-21 | Discharge: 2021-07-22 | Disposition: A | Discharge status: Home / Self Care | Payer: Medicare PPO | Source: Ambulatory Visit | Attending: Specialist | Admitting: Specialist

## 2021-07-21 ENCOUNTER — Ambulatory Visit (HOSPITAL_COMMUNITY): Payer: Medicare PPO

## 2021-07-21 DIAGNOSIS — Z419 Encounter for procedure for purposes other than remedying health state, unspecified: Secondary | ICD-10-CM

## 2021-07-21 DIAGNOSIS — E039 Hypothyroidism, unspecified: Secondary | ICD-10-CM | POA: Insufficient documentation

## 2021-07-21 DIAGNOSIS — M48062 Spinal stenosis, lumbar region with neurogenic claudication: Secondary | ICD-10-CM | POA: Insufficient documentation

## 2021-07-21 DIAGNOSIS — M4807 Spinal stenosis, lumbosacral region: Secondary | ICD-10-CM | POA: Insufficient documentation

## 2021-07-21 DIAGNOSIS — M7138 Other bursal cyst, other site: Secondary | ICD-10-CM | POA: Diagnosis not present

## 2021-07-21 DIAGNOSIS — I10 Essential (primary) hypertension: Secondary | ICD-10-CM | POA: Insufficient documentation

## 2021-07-21 DIAGNOSIS — M48061 Spinal stenosis, lumbar region without neurogenic claudication: Secondary | ICD-10-CM | POA: Diagnosis present

## 2021-07-21 HISTORY — PX: LUMBAR LAMINECTOMY/DECOMPRESSION MICRODISCECTOMY: SHX5026

## 2021-07-21 SURGERY — LUMBAR LAMINECTOMY/DECOMPRESSION MICRODISCECTOMY 1 LEVEL
Anesthesia: General

## 2021-07-21 MED ORDER — LACTATED RINGERS IV SOLN: INTRAVENOUS | Status: DC

## 2021-07-21 MED ORDER — DEXAMETHASONE SODIUM PHOSPHATE 10 MG/ML IJ SOLN
INTRAMUSCULAR | Status: DC | PRN
Start: 2021-07-21 — End: 2021-07-21
  Administered 2021-07-21: 5 mg via INTRAVENOUS

## 2021-07-21 MED ORDER — LOSARTAN POTASSIUM 50 MG PO TABS
100.0000 mg | ORAL_TABLET | Freq: Every day | ORAL | Status: DC
  Administered 2021-07-21 – 2021-07-22 (×2): 100 mg via ORAL
  Filled 2021-07-21 (×2): qty 2

## 2021-07-21 MED ORDER — RISAQUAD PO CAPS
1.0000 | ORAL_CAPSULE | Freq: Every day | ORAL | Status: DC
  Administered 2021-07-22: 1 via ORAL
  Filled 2021-07-21 (×3): qty 1

## 2021-07-21 MED ORDER — ONDANSETRON HCL 4 MG/2ML IJ SOLN: 4.0000 mg | Freq: Four times a day (QID) | INTRAMUSCULAR | Status: DC | PRN

## 2021-07-21 MED ORDER — ACETAMINOPHEN 650 MG RE SUPP: 650.0000 mg | RECTAL | Status: DC | PRN

## 2021-07-21 MED ORDER — SUGAMMADEX SODIUM 200 MG/2ML IV SOLN
INTRAVENOUS | Status: DC | PRN
  Administered 2021-07-21: 50 mg via INTRAVENOUS
  Administered 2021-07-21: 100 mg via INTRAVENOUS

## 2021-07-21 MED ORDER — HYDROCODONE-ACETAMINOPHEN 5-325 MG PO TABS
2.0000 | ORAL_TABLET | ORAL | Status: DC | PRN
  Administered 2021-07-21 – 2021-07-22 (×3): 2 via ORAL
  Filled 2021-07-21 (×3): qty 2

## 2021-07-21 MED ORDER — LEVOTHYROXINE SODIUM 25 MCG PO TABS
125.0000 ug | ORAL_TABLET | Freq: Every day | ORAL | Status: DC
  Administered 2021-07-22: 125 ug via ORAL
  Filled 2021-07-21: qty 1

## 2021-07-21 MED ORDER — CEFAZOLIN SODIUM-DEXTROSE 2-4 GM/100ML-% IV SOLN
INTRAVENOUS | Status: AC
  Filled 2021-07-21: qty 100

## 2021-07-21 MED ORDER — LACTATED RINGERS IV SOLN: INTRAVENOUS | Status: DC | PRN

## 2021-07-21 MED ORDER — MENTHOL 3 MG MT LOZG: 1.0000 | LOZENGE | OROMUCOSAL | Status: DC | PRN

## 2021-07-21 MED ORDER — PHENYLEPHRINE 40 MCG/ML (10ML) SYRINGE FOR IV PUSH (FOR BLOOD PRESSURE SUPPORT)
PREFILLED_SYRINGE | INTRAVENOUS | Status: DC | PRN
Start: 2021-07-21 — End: 2021-07-21
  Administered 2021-07-21 (×2): 40 ug via INTRAVENOUS

## 2021-07-21 MED ORDER — CEFAZOLIN SODIUM-DEXTROSE 1-4 GM/50ML-% IV SOLN
1.0000 g | Freq: Three times a day (TID) | INTRAVENOUS | Status: AC
  Administered 2021-07-21 (×2): 1 g via INTRAVENOUS
  Filled 2021-07-21 (×2): qty 50

## 2021-07-21 MED ORDER — THROMBIN 20000 UNITS EX SOLR
CUTANEOUS | Status: AC
  Filled 2021-07-21: qty 20000

## 2021-07-21 MED ORDER — ONDANSETRON HCL 4 MG/2ML IJ SOLN: 4.0000 mg | Freq: Once | INTRAMUSCULAR | Status: DC | PRN

## 2021-07-21 MED ORDER — ROCURONIUM BROMIDE 10 MG/ML (PF) SYRINGE
PREFILLED_SYRINGE | INTRAVENOUS | Status: DC | PRN
Start: 2021-07-21 — End: 2021-07-21
  Administered 2021-07-21: 10 mg via INTRAVENOUS
  Administered 2021-07-21: 40 mg via INTRAVENOUS
  Administered 2021-07-21 (×2): 10 mg via INTRAVENOUS

## 2021-07-21 MED ORDER — ORAL CARE MOUTH RINSE: 15.0000 mL | Freq: Once | OROMUCOSAL | Status: AC

## 2021-07-21 MED ORDER — ACETAMINOPHEN 325 MG PO TABS: 650.0000 mg | ORAL_TABLET | ORAL | Status: DC | PRN

## 2021-07-21 MED ORDER — BUPIVACAINE-EPINEPHRINE 0.5% -1:200000 IJ SOLN
INTRAMUSCULAR | Status: DC | PRN
  Administered 2021-07-21: 7 mL

## 2021-07-21 MED ORDER — ALUM & MAG HYDROXIDE-SIMETH 200-200-20 MG/5ML PO SUSP: 30.0000 mL | Freq: Four times a day (QID) | ORAL | Status: DC | PRN

## 2021-07-21 MED ORDER — METOPROLOL SUCCINATE ER 50 MG PO TB24
50.0000 mg | ORAL_TABLET | Freq: Every day | ORAL | Status: DC
  Administered 2021-07-22: 50 mg via ORAL
  Filled 2021-07-21: qty 1

## 2021-07-21 MED ORDER — DOCUSATE SODIUM 100 MG PO CAPS
100.0000 mg | ORAL_CAPSULE | Freq: Two times a day (BID) | ORAL | Status: DC
  Administered 2021-07-21 – 2021-07-22 (×3): 100 mg via ORAL
  Filled 2021-07-21 (×3): qty 1

## 2021-07-21 MED ORDER — FENTANYL CITRATE (PF) 250 MCG/5ML IJ SOLN
INTRAMUSCULAR | Status: DC | PRN
  Administered 2021-07-21 (×4): 50 ug via INTRAVENOUS

## 2021-07-21 MED ORDER — HYDROMORPHONE HCL 1 MG/ML IJ SOLN: 0.5000 mg | INTRAMUSCULAR | Status: DC | PRN

## 2021-07-21 MED ORDER — CHLORHEXIDINE GLUCONATE 0.12 % MT SOLN
OROMUCOSAL | Status: AC
  Administered 2021-07-21: 15 mL via OROMUCOSAL
  Filled 2021-07-21: qty 15

## 2021-07-21 MED ORDER — LIDOCAINE 2% (20 MG/ML) 5 ML SYRINGE
INTRAMUSCULAR | Status: AC
  Filled 2021-07-21: qty 5

## 2021-07-21 MED ORDER — HYDROMORPHONE HCL 1 MG/ML IJ SOLN: 0.2500 mg | INTRAMUSCULAR | Status: DC | PRN

## 2021-07-21 MED ORDER — POLYETHYLENE GLYCOL 3350 17 G PO PACK: 17.0000 g | PACK | Freq: Every day | ORAL | Status: DC | PRN

## 2021-07-21 MED ORDER — POTASSIUM CHLORIDE CRYS ER 20 MEQ PO TBCR
10.0000 meq | EXTENDED_RELEASE_TABLET | Freq: Every day | ORAL | Status: DC
  Administered 2021-07-21 – 2021-07-22 (×2): 10 meq via ORAL
  Filled 2021-07-21 (×2): qty 1

## 2021-07-21 MED ORDER — CHLORHEXIDINE GLUCONATE 0.12 % MT SOLN: 15.0000 mL | Freq: Once | OROMUCOSAL | Status: AC

## 2021-07-21 MED ORDER — PROPOFOL 10 MG/ML IV BOLUS
INTRAVENOUS | Status: DC | PRN
  Administered 2021-07-21: 90 mg via INTRAVENOUS
  Administered 2021-07-21: 20 mg via INTRAVENOUS

## 2021-07-21 MED ORDER — LEFLUNOMIDE 20 MG PO TABS
20.0000 mg | ORAL_TABLET | Freq: Every day | ORAL | Status: DC
  Administered 2021-07-22: 20 mg via ORAL
  Filled 2021-07-21: qty 1

## 2021-07-21 MED ORDER — ONDANSETRON HCL 4 MG PO TABS: 4.0000 mg | ORAL_TABLET | Freq: Four times a day (QID) | ORAL | Status: DC | PRN

## 2021-07-21 MED ORDER — METHOCARBAMOL 500 MG PO TABS
500.0000 mg | ORAL_TABLET | Freq: Four times a day (QID) | ORAL | Status: DC | PRN
  Administered 2021-07-21 – 2021-07-22 (×2): 500 mg via ORAL
  Filled 2021-07-21 (×2): qty 1

## 2021-07-21 MED ORDER — LIDOCAINE 2% (20 MG/ML) 5 ML SYRINGE
INTRAMUSCULAR | Status: DC | PRN
  Administered 2021-07-21: 50 mg via INTRAVENOUS

## 2021-07-21 MED ORDER — METHOCARBAMOL 1000 MG/10ML IJ SOLN: 500.0000 mg | Freq: Four times a day (QID) | INTRAVENOUS | Status: DC | PRN

## 2021-07-21 MED ORDER — EPHEDRINE SULFATE-NACL 50-0.9 MG/10ML-% IV SOSY
PREFILLED_SYRINGE | INTRAVENOUS | Status: DC | PRN
Start: 2021-07-21 — End: 2021-07-21
  Administered 2021-07-21 (×3): 2.5 mg via INTRAVENOUS
  Administered 2021-07-21: 5 mg via INTRAVENOUS

## 2021-07-21 MED ORDER — FENTANYL CITRATE (PF) 250 MCG/5ML IJ SOLN
INTRAMUSCULAR | Status: AC
  Filled 2021-07-21: qty 5

## 2021-07-21 MED ORDER — BUPIVACAINE-EPINEPHRINE 0.5% -1:200000 IJ SOLN
INTRAMUSCULAR | Status: AC
  Filled 2021-07-21: qty 1

## 2021-07-21 MED ORDER — ACETAMINOPHEN 10 MG/ML IV SOLN
INTRAVENOUS | Status: AC
  Filled 2021-07-21: qty 100

## 2021-07-21 MED ORDER — ROCURONIUM BROMIDE 10 MG/ML (PF) SYRINGE
PREFILLED_SYRINGE | INTRAVENOUS | Status: AC
  Filled 2021-07-21: qty 10

## 2021-07-21 MED ORDER — ACETAMINOPHEN 10 MG/ML IV SOLN
1000.0000 mg | INTRAVENOUS | Status: AC
  Administered 2021-07-21: 1000 mg via INTRAVENOUS

## 2021-07-21 MED ORDER — TRANEXAMIC ACID-NACL 1000-0.7 MG/100ML-% IV SOLN
INTRAVENOUS | Status: AC
  Filled 2021-07-21: qty 100

## 2021-07-21 MED ORDER — OXYCODONE HCL 5 MG PO TABS
5.0000 mg | ORAL_TABLET | ORAL | 0 refills | Status: DC | PRN
Start: 2021-07-21 — End: 2022-06-21

## 2021-07-21 MED ORDER — PHENOL 1.4 % MT LIQD: 1.0000 | OROMUCOSAL | Status: DC | PRN

## 2021-07-21 MED ORDER — BISACODYL 5 MG PO TBEC: 5.0000 mg | DELAYED_RELEASE_TABLET | Freq: Every day | ORAL | Status: DC | PRN

## 2021-07-21 MED ORDER — 0.9 % SODIUM CHLORIDE (POUR BTL) OPTIME
TOPICAL | Status: DC | PRN
  Administered 2021-07-21: 1000 mL

## 2021-07-21 MED ORDER — ONDANSETRON HCL 4 MG/2ML IJ SOLN
INTRAMUSCULAR | Status: AC
  Filled 2021-07-21: qty 4

## 2021-07-21 MED ORDER — CEFAZOLIN SODIUM-DEXTROSE 2-4 GM/100ML-% IV SOLN
2.0000 g | INTRAVENOUS | Status: AC
  Administered 2021-07-21: 2 g via INTRAVENOUS

## 2021-07-21 MED ORDER — DEXAMETHASONE SODIUM PHOSPHATE 10 MG/ML IJ SOLN
INTRAMUSCULAR | Status: AC
  Filled 2021-07-21: qty 1

## 2021-07-21 MED ORDER — PROPOFOL 10 MG/ML IV BOLUS
INTRAVENOUS | Status: AC
  Filled 2021-07-21: qty 20

## 2021-07-21 MED ORDER — DOCUSATE SODIUM 100 MG PO CAPS: 100.0000 mg | ORAL_CAPSULE | Freq: Two times a day (BID) | ORAL | 1 refills | Status: DC | PRN

## 2021-07-21 MED ORDER — THROMBIN 20000 UNITS EX SOLR
CUTANEOUS | Status: DC | PRN
  Administered 2021-07-21: 20 mL via TOPICAL

## 2021-07-21 MED ORDER — POLYETHYLENE GLYCOL 3350 17 G PO PACK: 17.0000 g | PACK | Freq: Every day | ORAL | 0 refills | Status: AC

## 2021-07-21 MED ORDER — PHENYLEPHRINE HCL-NACL 20-0.9 MG/250ML-% IV SOLN
INTRAVENOUS | Status: DC | PRN
Start: 2021-07-21 — End: 2021-07-21
  Administered 2021-07-21: 25 ug/min via INTRAVENOUS

## 2021-07-21 MED ORDER — TRANEXAMIC ACID-NACL 1000-0.7 MG/100ML-% IV SOLN
1000.0000 mg | INTRAVENOUS | Status: AC
  Administered 2021-07-21: 1000 mg via INTRAVENOUS

## 2021-07-21 MED ORDER — ONDANSETRON HCL 4 MG/2ML IJ SOLN
INTRAMUSCULAR | Status: DC | PRN
  Administered 2021-07-21: 4 mg via INTRAVENOUS

## 2021-07-21 MED ORDER — KCL IN DEXTROSE-NACL 20-5-0.45 MEQ/L-%-% IV SOLN: INTRAVENOUS | Status: DC

## 2021-07-21 MED ORDER — OXYCODONE HCL 5 MG PO TABS: 5.0000 mg | ORAL_TABLET | ORAL | Status: DC | PRN

## 2021-07-21 SURGICAL SUPPLY — 63 items
BAG COUNTER SPONGE SURGICOUNT (BAG) ×3 IMPLANT
BAG DECANTER FOR FLEXI CONT (MISCELLANEOUS) IMPLANT
BAG SPNG CNTER NS LX DISP (BAG) ×1
BAND INSRT 18 STRL LF DISP RB (MISCELLANEOUS) ×2
BAND RUBBER #18 3X1/16 STRL (MISCELLANEOUS) ×6 IMPLANT
BUR EGG ELITE 5.0 (BURR) IMPLANT
BUR RND DIAMOND ELITE 4.0 (BURR) ×1 IMPLANT
BUR STRYKR EGG 5.0 (BURR) IMPLANT
CABLE BIPOLOR RESECTION CORD (MISCELLANEOUS) ×1 IMPLANT
CARTRIDGE OIL MAESTRO DRILL (MISCELLANEOUS) IMPLANT
CLEANER TIP ELECTROSURG 2X2 (MISCELLANEOUS) ×3 IMPLANT
CNTNR URN SCR LID CUP LEK RST (MISCELLANEOUS) ×2 IMPLANT
CONT SPEC 4OZ STRL OR WHT (MISCELLANEOUS) ×2
DIFFUSER DRILL AIR PNEUMATIC (MISCELLANEOUS) ×1 IMPLANT
DRAPE LAPAROTOMY 100X72X124 (DRAPES) ×3 IMPLANT
DRAPE MICROSCOPE LEICA (MISCELLANEOUS) ×3 IMPLANT
DRAPE SHEET LG 3/4 BI-LAMINATE (DRAPES) ×3 IMPLANT
DRAPE SURG 17X11 SM STRL (DRAPES) ×3 IMPLANT
DRAPE UTILITY XL STRL (DRAPES) ×3 IMPLANT
DRSG AQUACEL AG ADV 3.5X 4 (GAUZE/BANDAGES/DRESSINGS) IMPLANT
DRSG AQUACEL AG ADV 3.5X 6 (GAUZE/BANDAGES/DRESSINGS) ×1 IMPLANT
DRSG TELFA 3X8 NADH (GAUZE/BANDAGES/DRESSINGS) IMPLANT
DURAPREP 26ML APPLICATOR (WOUND CARE) ×3 IMPLANT
DURASEAL SPINE SEALANT 3ML (MISCELLANEOUS) IMPLANT
ELECT BLADE 4.0 EZ CLEAN MEGAD (MISCELLANEOUS)
ELECT REM PT RETURN 9FT ADLT (ELECTROSURGICAL) ×2
ELECTRODE BLDE 4.0 EZ CLN MEGD (MISCELLANEOUS) IMPLANT
ELECTRODE REM PT RTRN 9FT ADLT (ELECTROSURGICAL) ×2 IMPLANT
GLOVE SURG POLYISO LF SZ7.5 (GLOVE) ×3 IMPLANT
GLOVE SURG POLYISO LF SZ8 (GLOVE) ×6 IMPLANT
GLOVE SURG UNDER POLY LF SZ7 (GLOVE) ×3 IMPLANT
GOWN STRL REUS W/ TWL LRG LVL3 (GOWN DISPOSABLE) ×2 IMPLANT
GOWN STRL REUS W/ TWL XL LVL3 (GOWN DISPOSABLE) ×2 IMPLANT
GOWN STRL REUS W/TWL LRG LVL3 (GOWN DISPOSABLE) ×2
GOWN STRL REUS W/TWL XL LVL3 (GOWN DISPOSABLE) ×2
IV CATH 14GX2 1/4 (CATHETERS) ×3 IMPLANT
KIT BASIN OR (CUSTOM PROCEDURE TRAY) ×3 IMPLANT
NDL SPNL 18GX3.5 QUINCKE PK (NEEDLE) ×4 IMPLANT
NEEDLE 22X1 1/2 (OR ONLY) (NEEDLE) ×3 IMPLANT
NEEDLE SPNL 18GX3.5 QUINCKE PK (NEEDLE) ×6 IMPLANT
OIL CARTRIDGE MAESTRO DRILL (MISCELLANEOUS) ×2
PACK LAMINECTOMY NEURO (CUSTOM PROCEDURE TRAY) ×3 IMPLANT
PAD DRESSING TELFA 3X8 NADH (GAUZE/BANDAGES/DRESSINGS) IMPLANT
PATTIES SURGICAL .75X.75 (GAUZE/BANDAGES/DRESSINGS) ×3 IMPLANT
SPONGE SURGIFOAM ABS GEL 100 (HEMOSTASIS) ×3 IMPLANT
SPONGE T-LAP 4X18 ~~LOC~~+RFID (SPONGE) ×2 IMPLANT
STAPLER VISISTAT (STAPLE) ×1 IMPLANT
STRIP CLOSURE SKIN 1/2X4 (GAUZE/BANDAGES/DRESSINGS) ×3 IMPLANT
SUT BONE WAX W31G (SUTURE) ×1 IMPLANT
SUT NURALON 4 0 TR CR/8 (SUTURE) IMPLANT
SUT PROLENE 3 0 PS 2 (SUTURE) IMPLANT
SUT VIC AB 1 CT1 27 (SUTURE) ×4
SUT VIC AB 1 CT1 27XBRD ANTBC (SUTURE) IMPLANT
SUT VIC AB 1-0 CT2 27 (SUTURE) IMPLANT
SUT VIC AB 2-0 CT1 27 (SUTURE) ×2
SUT VIC AB 2-0 CT1 TAPERPNT 27 (SUTURE) IMPLANT
SUT VIC AB 2-0 CT2 27 (SUTURE) IMPLANT
SYR 3ML LL SCALE MARK (SYRINGE) ×3 IMPLANT
TOWEL GREEN STERILE (TOWEL DISPOSABLE) ×3 IMPLANT
TOWEL GREEN STERILE FF (TOWEL DISPOSABLE) ×3 IMPLANT
TRAY FOLEY MTR SLVR 16FR STAT (SET/KITS/TRAYS/PACK) ×3 IMPLANT
WIPE CHG CHLORHEXIDINE 2% (PERSONAL CARE ITEMS) ×3 IMPLANT
YANKAUER SUCT BULB TIP NO VENT (SUCTIONS) ×3 IMPLANT

## 2021-07-21 NOTE — Discharge Instructions (Signed)

## 2021-07-21 NOTE — Anesthesia Procedure Notes (Signed)
Procedure Name: Intubation Date/Time: 07/21/2021 7:41 AM Performed by: Janene Harvey, CRNA Pre-anesthesia Checklist: Patient identified, Emergency Drugs available, Suction available and Patient being monitored Patient Re-evaluated:Patient Re-evaluated prior to induction Oxygen Delivery Method: Circle system utilized Preoxygenation: Pre-oxygenation with 100% oxygen Induction Type: IV induction Ventilation: Mask ventilation without difficulty and Oral airway inserted - appropriate to patient size Laryngoscope Size: Mac and 3 Grade View: Grade II Tube type: Oral Tube size: 7.0 mm Number of attempts: 1 Airway Equipment and Method: Stylet and Oral airway Placement Confirmation: ETT inserted through vocal cords under direct vision, positive ETCO2 and breath sounds checked- equal and bilateral Secured at: 21 cm Tube secured with: Tape Dental Injury: Teeth and Oropharynx as per pre-operative assessment

## 2021-07-21 NOTE — Progress Notes (Signed)
Orthopedic Tech Progress Note Patient Details:  Deborah Kerr 10/29/1945 [id number]  Ortho Devices Type of Ortho Device: Lumbar corsett Ortho Device/Splint Interventions: Deborah Kerr 07/21/2021, 11:30 AM Delivered LSO to [facility]

## 2021-07-21 NOTE — Op Note (Signed)
NAME: Deborah Kerr, Deborah L. MEDICAL RECORD NO: [id number] ACCOUNT NO: [id number] DATE OF BIRTH: [birth year] FACILITY: [facility] LOCATION: [facility] PHYSICIAN: Javier DockerJeffrey C. 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  POSTOPERATIVE DIAGNOSES:    1.  Spinal stenosis, L4-5, L5-S1. 2.  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 3.  Synovial cyst at L5-S1.  PROCEDURES PERFORMED: 1. Micro lumbar decompression L4-L5 with bilateral foraminotomies L5, L4. 2. Excision of synovial cyst, L4-L5 left facet.  3. Decompression L5-S1 with hemilaminectomy L5 on the left. 4. Excision of synovial cyst, L5-S1 on the left.  ANESTHESIA:  General.  ASSISTANT:  Andrez GrimeJaclyn Bissell, PA  HISTORY:  This is a 76 year old female who is having predominant left lower extremity radicular pain, L5 nerve root distribution, secondary to multifactorial severe spinal stenosis at L4-L5, associated synovial cyst, foraminal stenosis and a possible  disk herniation.  She had no right sided symptoms where there was a disk herniation at L3-L4.  So after preoperative examination, we decided to confine the decompression L4-L5 and L5-S1 as she has predominantly L5 nerve root problems.  Risks and benefits  were discussed including bleeding, infection, damage to neurovascular structures.  No change in symptoms, worsening symptoms, DVT, PE, anesthetic complications, need for fusion in the future, etc.  DESCRIPTION OF PROCEDURE:  With the patient in supine position, after induction of adequate general anesthesia, 2 grams Kefzol, she was placed prone on the Wilson frame.  All bony prominences were well padded.  Foley to gravity.  Lumbar region was  prepped and draped in usual sterile fashion.  Two 18-gauge spinal needle was utilized to localize the L4-L5, and L5-S1 interspace, confirmed with x-ray.  Incision was made from the spinous process of L4 to the level of S1.  Subcutaneous  tissue was  dissected.  Electrocautery was utilized to achieve hemostasis.  Dorsal lumbar fascia divided in line with skin incision.  Paraspinous muscles elevated from lamina of L4-L5 and L5-S1.  Bipolar cautery was utilized to achieve hemostasis.  The patient's  blood pressure was elevated initially, which increased the intraoperative bleeding, after controlling it the hemostasis was controlled.  McCulloch retractor was placed.  Operating microscope was draped and brought in the surgical field.  Leksell rongeur  was utilized to remove the spinous processes of L4 and L5.  The patient had very near absent interlaminar space at L4-L5.  I used a laminar spreader to gently distract between the lamina of L4, L5 and then used a 3 mm Kerrison to perform   hemilaminotomies of the caudad edge of L4 bilaterally, cephalad to the point detaching the ligamentum flavum.  Following this, I released the lamina spreader.  I used a straight curette to detach the ligamentum flavum from the cephalad edge of L5.  I  then began removing the ligamentum flavum from the interspace removing right and then left sequentially.  First, the right with hypertrophic ligamentum flavum, there was severe stenosis noted.  With the neural elements protected at all times, I  decompressed the lateral recess and the foramen of L4 and L5 on the right.  This was with a 2 mm Kerrison.  Neural elements were protected at all times.  On the left, there was a synovial cyst extending from the facet at L4-L5.  I was able to identify  normal anatomy cephalad to the disk and caudad to it, cephalad to the facet and at the foramen of L5. A Woodson retractor  then was utilized to begin developing a plane beneath the thecal sac and the synovial cyst.  I used a straight curette to  skeletonize the medial border of the superior articulating process of L5.  Following this, I was then able to remove ligamentum flavum from the left at L4-L5.  I performed a foraminotomy  of L4 with a Woodson retractor and again developed that plane.   There was an area where there were adhesions of the outer portion of the cyst to the thecal sac, not amenable to removal.  I removed the gelatinous portion of the synovial cyst, excised. I then with a Woodson retractor developed a plane distally between  the thecal sac and the cyst lining.  Following this, I was able to perform a foraminotomy of the L5, which was stenotic.  This is after protecting the L5 root.  I removed ligamentum from the interspace.  I used a straight curette as well to remove as  much of the synovial cyst lining, which was not adhered to the thecal sac.  There was no disk herniation noted at L4-L5 or extending the 4 foramen.  A Woodson passed freely out the foramen of 4 and then out the foramen of 5.  There was still some  stenosis noted at L4, foramen of 5. Bipolar cautery was utilized to achieve hemostasis as well as bone wax on the cancellous surfaces for hemostasis.  Neuro patties were used to protect the thecal sac at all times.  I then used a Leksell rongeur to begin  removal of the hemilamina of L5 on the left.  I then used a 2 mm Kerrison to detach ligamentum flavum from the caudad edge of L5.  I removed the ligamentum flavum on the left at 5, 1.  There was some lateral recess stenosis noted.  Woodson retractor was  utilized to protect the thecal sac and the hemilamina on the left was then removed.  Following this, from the opposite side of the operating table , I performed a foraminotomy, a generous foraminotomy of L5.  There was stenosis noted proximally as well  as hypertrophic ligamentum flavum.  Following this, the [facility] probe passed freely out the foramen of L5 and down to S1.  Just prior to that, though with the facet on the superior aspect of the articulating process of S1, there is a small synovial cyst  at the apex of the joint.  I developed a plane between it and the thecal sac and excised it.  I then  decompressed the cyst and its gelatinous contents.  Both cysts appeared to be normal synovial cyst.  Following this, I obtained x-ray with a Woodson in  the foramen of 4 and 5.  I copiously irrigated with antibiotic irrigation.  Thrombin-soaked Gelfoam was placed in the laminotomy defect and then removed.  I performed a Valsalva to 40 mm.  No active CSF leakage or active bleeding.  Good restoration of  the thecal sac was noted.  Pathology was consistent with her clinical exam and that seen on the MRI.  No specimen was sent.  Next I released the [facility] retractor, irrigated the paraspinous musculature.  Strict hemostasis was achieved.  No active  bleeding was noted.  Therefore, I closed the dorsal lumbar fascia with #1 Vicryl in interrupted figure-of-eight sutures.  Subcutaneous tissue was copiously irrigated as well, subcutaneous with 2-0 and skin with staples.  Small aperture in the fascia was  left distally to allow for any drainage if it  occurred.  Sterile dressing applied, placed supine on the hospital bed, extubated without difficulty and transported to the recovery room in satisfactory condition.  The patient tolerated the procedure well.  No complications.  BLOOD LOSS:  150 mL.  Assistant Andrez Grime, [place] was present throughout the case, helped with patient positioning, intermittent suction, neural retraction, closure and patient positioning.    [facility] D: 07/21/2021 11:22:39 am T: 07/21/2021 11:32:00 pm  JOB: [id number]

## 2021-07-21 NOTE — Transfer of Care (Signed)
Immediate Anesthesia Transfer of Care Note  Patient: Deborah Kerr  Procedure(s) Performed: Microlumbar decompression Lumbar Four-Five and Lumbar Five - Sacral One, excision of synovial cyst  Patient Location: PACU  Anesthesia Type:General  Level of Consciousness: drowsy and patient cooperative  Airway & Oxygen Therapy: Patient Spontanous Breathing and Patient connected to face mask oxygen  Post-op Assessment: Report given to RN, Post -op Vital signs reviewed and stable and Patient moving all extremities X 4  Post vital signs: Reviewed and stable  Last Vitals:  Vitals Value Taken Time  BP 169/91 07/21/21 1107  Temp    Pulse 73 07/21/21 1110  Resp 17 07/21/21 1110  SpO2 100 % 07/21/21 1110  Vitals shown include unvalidated device data.  Last Pain:  Vitals:   07/21/21 0639  TempSrc:   PainSc: 6       Patients Stated Pain Goal: 4 ([id number])  Complications: No notable events documented.

## 2021-07-21 NOTE — Interval H&P Note (Signed)
History and Physical Interval Note:  07/21/2021 7:14 AM  Deborah Kerr  has presented today for surgery, with the diagnosis of Spinal stenosis L4-5, L3-4.  The various methods of treatment have been discussed with the patient and family. After consideration of risks, benefits and other options for treatment, the patient has consented to  Procedure(s): Microlumbar decompression L4-5, possible L3-4 excision of synovial cyst (N/A) as a surgical intervention.  The patient's history has been reviewed, patient examined, no change in status, stable for surgery.  I have reviewed the patient's chart and labs.  Questions were answered to the patient's satisfaction.     Deborah Kerr  Possible L5S1 discussed with patient

## 2021-07-21 NOTE — Anesthesia Postprocedure Evaluation (Signed)
Anesthesia Post Note  Patient: Deborah Kerr  Procedure(s) Performed: Microlumbar decompression Lumbar Four-Five and Lumbar Five - Sacral One, excision of synovial cyst     Patient location during evaluation: PACU Anesthesia Type: General Level of consciousness: awake Pain management: pain level controlled Vital Signs Assessment: post-procedure vital signs reviewed and stable Respiratory status: spontaneous breathing Cardiovascular status: stable Postop Assessment: no apparent nausea or vomiting Anesthetic complications: no   No notable events documented.  Last Vitals:  Vitals:   07/21/21 1123 07/21/21 1138  BP: (!) 165/84 (!) 174/86  Pulse: 69 70  Resp: 15 20  Temp:    SpO2: 96% 99%    Last Pain:  Vitals:   07/21/21 1138  TempSrc:   PainSc: 0-No pain                 Caren Macadam

## 2021-07-21 NOTE — Brief Op Note (Signed)
07/21/2021  11:22 AM  PATIENT:  Deborah Kerr  76 y.o. female  PRE-OPERATIVE DIAGNOSIS:  Spinal stenosis L4-5, L3-4  POST-OPERATIVE DIAGNOSIS:  Spinal stenosis L4-5, L3-4  PROCEDURE:  Procedure(s): Microlumbar decompression Lumbar Four-Five and Lumbar Five - Sacral One, excision of synovial cyst (N/A)  SURGEON:  Surgeon(s) and Role:    Susa Day, MD - Primary  PHYSICIAN ASSISTANT:   Assistant: Phoebe Sharps  ANESTHESIA:   general  EBL:  150 mL   BLOOD ADMINISTERED:none  DRAINS: none   LOCAL MEDICATIONS USED:  MARCAINE     SPECIMEN:  No Specimen  DISPOSITION OF SPECIMEN:  N/A  COUNTS:  YES  TOURNIQUET:  * No tourniquets in log *  DICTATION: .Other Dictation: Dictation Number [id number]  PLAN OF CARE: Admit for overnight observation  PATIENT DISPOSITION:  PACU - hemodynamically stable.   Delay start of Pharmacological VTE agent (>24hrs) due to surgical blood loss or risk of bleeding: no

## 2021-07-22 ENCOUNTER — Encounter (HOSPITAL_COMMUNITY): Payer: Self-pay | Admitting: Specialist

## 2021-07-22 DIAGNOSIS — M48062 Spinal stenosis, lumbar region with neurogenic claudication: Secondary | ICD-10-CM | POA: Diagnosis not present

## 2021-07-22 LAB — CBC
HCT: 31.8 % — ABNORMAL LOW (ref 36.0–46.0)
Hemoglobin: 10.5 g/dL — ABNORMAL LOW (ref 12.0–15.0)
MCH: 30.6 pg (ref 26.0–34.0)
MCHC: 33 g/dL (ref 30.0–36.0)
MCV: 92.7 fL (ref 80.0–100.0)
Platelets: 177 10*3/uL (ref 150–400)
RBC: 3.43 MIL/uL — ABNORMAL LOW (ref 3.87–5.11)
RDW: 13.3 % (ref 11.5–15.5)
WBC: 7.6 10*3/uL (ref 4.0–10.5)
nRBC: 0 % (ref 0.0–0.2)

## 2021-07-22 LAB — BASIC METABOLIC PANEL
Anion gap: 9 (ref 5–15)
BUN: 15 mg/dL (ref 8–23)
CO2: 26 mmol/L (ref 22–32)
Calcium: 7.5 mg/dL — ABNORMAL LOW (ref 8.9–10.3)
Chloride: 103 mmol/L (ref 98–111)
Creatinine, Ser: 0.94 mg/dL (ref 0.44–1.00)
GFR, Estimated: >60 mL/min (ref >60)
Glucose, Bld: 95 mg/dL (ref 70–99)
Potassium: 3.6 mmol/L (ref 3.5–5.1)
Sodium: 138 mmol/L (ref 135–145)

## 2021-07-22 NOTE — Progress Notes (Signed)
Subjective: 1 Day Post-Op Procedure(s) (LRB): Microlumbar decompression Lumbar Four-Five and Lumbar Five - Sacral One, excision of synovial cyst (N/A) Patient reports pain as mild.  Reports incisional pain, no leg pain. Immediate relief of leg pain post-op. Voiding without difficulty.  Objective: Vital signs in last 24 hours: Temp:  [97.8 F (36.6 C)-98.3 F (36.8 C)] 98.1 F (36.7 C) (01/20 0235) Pulse Rate:  [69-80] 70 (01/20 0235) Resp:  [15-20] 18 (01/20 0235) BP: (121-190)/(69-98) 158/76 (01/20 0235) SpO2:  [96 %-99 %] 98 % (01/20 0235)  Intake/Output from previous day: 01/19 0701 - 01/20 0700 In: 1840 [P.O.:240; I.V.:1300; IV Piggyback:300] Out: 545 [Urine:395; Blood:150] Intake/Output this shift: No intake/output data recorded.  Recent Labs    07/22/21 0504  HGB 10.5*   Recent Labs    07/22/21 0504  WBC 7.6  RBC 3.43*  HCT 31.8*  PLT 177   Recent Labs    07/22/21 0504  NA 138  K 3.6  CL 103  CO2 26  BUN 15  CREATININE 0.94  GLUCOSE 95  CALCIUM 7.5*   No results for input(s): LABPT, INR in the last 72 hours.  Neurologically intact ABD soft Neurovascular intact Sensation intact distally Intact pulses distally Dorsiflexion/Plantar flexion intact Incision: dressing C/D/I and no drainage No cellulitis present Compartment soft No calf pain or sign of DVT   Assessment/Plan: 1 Day Post-Op Procedure(s) (LRB): Microlumbar decompression Lumbar Four-Five and Lumbar Five - Sacral One, excision of synovial cyst (N/A) Advance diet Up with therapy D/C IV fluids Discussed D/c instructions, dressing instructions, Lspine precautions D/C to home today   Dorothy Spark 07/22/2021, 7:59 AM

## 2021-07-22 NOTE — Plan of Care (Signed)

## 2021-07-22 NOTE — Discharge Summary (Signed)
Physician Discharge Summary   Patient ID: Deborah Kerr MRN: [id number] DOB/AGE: 1945/10/24 76 y.o.  Admit date: 07/21/2021 Discharge date: 07/22/2021  Primary Diagnosis:   Spinal stenosis L4-5, L3-4  Admission Diagnoses:  Past Medical History:  Diagnosis Date   Arthritis    rheumatoid arthritis   Hypertension    Hypothyroidism 2009   Left bundle branch block    Thyroid disease    Discharge Diagnoses:   Principal Problem:   Spinal stenosis at L4-L5 level  Procedure:  Procedure(s) (LRB): Microlumbar decompression Lumbar Four-Five and Lumbar Five - Sacral One, excision of synovial cyst (N/A)   Consults: None  HPI:  See H&P    Laboratory Data: Hospital Outpatient Visit on 07/18/2021  Component Date Value Ref Range Status   WBC 07/18/2021 5.3  4.0 - 10.5 K/uL Final   RBC 07/18/2021 4.23  3.87 - 5.11 MIL/uL Final   Hemoglobin 07/18/2021 13.3  12.0 - 15.0 g/dL Final   HCT 40/98/1191 40.1  36.0 - 46.0 % Final   MCV 07/18/2021 94.8  80.0 - 100.0 fL Final   MCH 07/18/2021 31.4  26.0 - 34.0 pg Final   MCHC 07/18/2021 33.2  30.0 - 36.0 g/dL Final   RDW 47/82/9562 13.2  11.5 - 15.5 % Final   Platelets 07/18/2021 234  150 - 400 K/uL Final   nRBC 07/18/2021 0.0  0.0 - 0.2 % Final   Performed at [facility], [address], [place], [place] [address]   Sodium 07/18/2021 137  135 - 145 mmol/L Final   Potassium 07/18/2021 3.5  3.5 - 5.1 mmol/L Final   Chloride 07/18/2021 101  98 - 111 mmol/L Final   CO2 07/18/2021 27  22 - 32 mmol/L Final   Glucose, Bld 07/18/2021 95  70 - 99 mg/dL Final   Glucose reference range applies only to samples taken after fasting for at least 8 hours.   BUN 07/18/2021 14  8 - 23 mg/dL Final   Creatinine, Ser 07/18/2021 0.79  0.44 - 1.00 mg/dL Final   Calcium 57/84/6962 8.3 (L)  8.9 - 10.3 mg/dL Final   GFR, Estimated 07/18/2021 >60  >60 mL/min Final   Comment: (NOTE) Calculated using the CKD-EPI Creatinine Equation (2021)    Anion gap  07/18/2021 9  5 - 15 Final   Performed at [facility], [address], [place], [place] [address]   SARS Coronavirus 2 07/18/2021 NEGATIVE  NEGATIVE Final   Comment: (NOTE) SARS-CoV-2 target nucleic acids are NOT DETECTED.  The SARS-CoV-2 RNA is generally detectable in upper and lower respiratory specimens during the acute phase of infection. Negative results do not preclude SARS-CoV-2 infection, do not rule out co-infections with other pathogens, and should not be used as the sole basis for treatment or other patient management decisions. Negative results must be combined with clinical observations, patient history, and epidemiological information. The expected result is Negative.  Fact Sheet for Patients: HairSlick.no  Fact Sheet for Healthcare Providers: quierodirigir.com  This test is not yet approved or cleared by the [place] FDA and  has been authorized for detection and/or diagnosis of SARS-CoV-2 by FDA under an Emergency Use Authorization (EUA). This EUA will remain  in effect (meaning this test can be used) for the duration of the COVID-19 declaration under Se                          ction 564(b)(1) of the Act,  21 U.S.C. section 360bbb-3(b)(1), unless the authorization is terminated or revoked sooner.  Performed at [facility], [address], [place], [place] [address]    MRSA, PCR 07/18/2021 NEGATIVE  NEGATIVE Final   Staphylococcus aureus 07/18/2021 NEGATIVE  NEGATIVE Final   Comment: (NOTE) The Xpert SA Assay (FDA approved for NASAL specimens in patients 99 years of age and older), is one component of a comprehensive surveillance program. It is not intended to diagnose infection nor to guide or monitor treatment. Performed at [facility], [address] [place], [place] [address]    Recent Labs    07/22/21 0504  HGB 10.5*   Recent Labs    07/22/21 0504  WBC 7.6  RBC  3.43*  HCT 31.8*  PLT 177   Recent Labs    07/22/21 0504  NA 138  K 3.6  CL 103  CO2 26  BUN 15  CREATININE 0.94  GLUCOSE 95  CALCIUM 7.5*   No results for input(s): LABPT, INR in the last 72 hours.  X-Rays:DG Lumbar Spine 2-3 Views  Result Date: 07/21/2021 CLINICAL DATA:  Localization intraoperative image. EXAM: LUMBAR SPINE - 2-3 VIEW COMPARISON:  Lumbar spine x-ray 07/18/2021. FINDINGS: Intraoperative images of the lumbar spine were performed. Hardware overlies the posterior elements at L4-L5 on the third image. There is no fracture. Alignment is anatomic. There is stable moderate severe disc space narrowing throughout the lumbar spine compatible with degenerative change. IMPRESSION: Intraoperative lumbar spine. Electronically Signed   By: Darliss Cheney M.D.   On: 07/21/2021 15:04   DG Lumbar Spine 2-3 Views  Result Date: 07/18/2021 CLINICAL DATA:  Pre-admission for lumbar fusion. EXAM: LUMBAR SPINE - 2-3 VIEW COMPARISON:  None. FINDINGS: Levoconvex thoracolumbar scoliosis. Alignment is otherwise anatomic. Vertebral body height is maintained. Multilevel endplate degenerative changes and loss of disc space height. Findings are worst at L2-3 and L4-5. Facet hypertrophy in the lumbar spine. IMPRESSION: 1. 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 2. Multilevel facet hypertrophy. 3. Levoconvex scoliosis. Electronically Signed   By: Leanna Battles M.D.   On: 07/18/2021 15:57    EKG: Orders placed or performed during the hospital encounter of 07/18/21   EKG 12 lead per protocol   EKG 12 lead per protocol     Hospital Course: Patient was admitted to [facility] and taken to the OR and underwent the above state procedure without complications.  Patient tolerated the procedure well and was later transferred to the recovery room and then to the orthopaedic floor for postoperative care.  They were given PO and IV analgesics for pain control following their  surgery.  They were given 24 hours of postoperative antibiotics.   PT was consulted postop to assist with mobility and transfers.  The patient was allowed to be WBAT with therapy and was taught back precautions. Discharge planning was consulted to help with postop disposition and equipment needs.  Patient had a good night on the evening of surgery and started to get up OOB with therapy on day one. Patient was seen in rounds and was ready to go home on day one.  They were given discharge instructions and dressing directions.  They were instructed on when to follow up in the office with Dr. Shelle Iron.   Diet: Regular diet Activity:WBAT, Lspine precautions, brace prn when up Follow-up:in 10-14 days Disposition - Home Discharged Condition: good   Discharge Instructions     Call MD / Call 911  Complete by: As directed    If you experience chest pain or shortness of breath, CALL 911 and be transported to the hospital emergency room.  If you develope a fever above 101 F, pus (white drainage) or increased drainage or redness at the wound, or calf pain, call your surgeon's office.   Constipation Prevention   Complete by: As directed    Drink plenty of fluids.  Prune juice may be helpful.  You may use a stool softener, such as Colace (over the counter) 100 mg twice a day.  Use MiraLax (over the counter) for constipation as needed.   Diet - low sodium heart healthy   Complete by: As directed    Increase activity slowly as tolerated   Complete by: As directed    Post-operative opioid taper instructions:   Complete by: As directed    POST-OPERATIVE OPIOID TAPER INSTRUCTIONS: It is important to wean off of your opioid medication as soon as possible. If you do not need pain medication after your surgery it is ok to stop day one. Opioids include: Codeine, Hydrocodone(Norco, Vicodin), Oxycodone(Percocet, oxycontin) and hydromorphone amongst others.  Long term and even short term use of opiods can  cause: Increased pain response Dependence Constipation Depression Respiratory depression And more.  Withdrawal symptoms can include Flu like symptoms Nausea, vomiting And more Techniques to manage these symptoms Hydrate well Eat regular healthy meals Stay active Use relaxation techniques(deep breathing, meditating, yoga) Do Not substitute Alcohol to help with tapering If you have been on opioids for less than two weeks and do not have pain than it is ok to stop all together.  Plan to wean off of opioids This plan should start within one week post op of your joint replacement. Maintain the same interval or time between taking each dose and first decrease the dose.  Cut the total daily intake of opioids by one tablet each day Next start to increase the time between doses. The last dose that should be eliminated is the evening dose.         Allergies as of 07/22/2021       Reactions   Orencia [abatacept]    Coughing, palm of hands itch         Medication List     STOP taking these medications    HYDROcodone-acetaminophen 5-325 MG tablet Commonly known as: NORCO/VICODIN   hydroxychloroquine 200 MG tablet Commonly known as: PLAQUENIL       TAKE these medications    acetaminophen 500 MG tablet Commonly known as: TYLENOL Take 1,000 mg by mouth every 6 (six) hours as needed for moderate pain or headache.   CALCIUM 600+D PO Take 1 tablet by mouth 2 (two) times daily.   docusate sodium 100 MG capsule Commonly known as: Colace Take 1 capsule (100 mg total) by mouth 2 (two) times daily as needed for mild constipation.   leflunomide 20 MG tablet Commonly known as: ARAVA Take 20 mg by mouth daily.   levothyroxine 125 MCG tablet Commonly known as: SYNTHROID Take 125 mcg by mouth daily before breakfast.   losartan 100 MG tablet Commonly known as: COZAAR Take 100 mg by mouth daily.   metoprolol succinate 50 MG 24 hr tablet Commonly known as: TOPROL-XL Take  50 mg by mouth in the morning and at bedtime.   oxyCODONE 5 MG immediate release tablet Commonly known as: Oxy IR/ROXICODONE Take 1 tablet (5 mg total) by mouth every 4 (four) hours as needed for severe pain.  polyethylene glycol 17 g packet Commonly known as: MIRALAX / GLYCOLAX Take 17 g by mouth daily.   potassium chloride SA 20 MEQ tablet Commonly known as: KLOR-CON M Take 10 mEq by mouth daily.        Follow-up Information     Jene Every, MD Follow up in 2 week(s).   Specialty: Orthopedic Surgery Contact information: [address] [place] [address] [place] [address] [phone]                 Signed: Andrez Grime PA-C Orthopaedic Surgery 07/22/2021, 8:01 AM

## 2021-07-22 NOTE — Evaluation (Signed)
Physical Therapy Evaluation Patient Details Name: Deborah DredgeSally L Kerr MRN: [id number] DOB: July 22, 1945 Today's Date: 07/22/2021  History of Present Illness  Pt is a 76 year old woman admitted for microlumbar decompression L 4-5, L5-S1 and excision of synovial cyst. PMH: RA, hypothyroidism, HTN.  Clinical Impression  Pt presents to PT mobilizing well s/p back surgery. Pt requires increased time but is able to complete all mobility required within the home unassisted. PT provides education on back precautions and brace use. Pt expresses comfort in her current level of mobility. Pt has no further acute PT needs and is encouraged to frequently mobilize at the time of discharge. Acute PT signing off.       Recommendations for follow up therapy are one component of a multi-disciplinary discharge planning process, led by the attending physician.  Recommendations may be updated based on patient status, additional functional criteria and insurance authorization.  Follow Up Recommendations No PT follow up    Assistance Recommended at Discharge PRN  Patient can return home with the following  Assist for transportation    Equipment Recommendations None recommended by PT  Recommendations for Other Services       Functional Status Assessment Patient has had a recent decline in their functional status and demonstrates the ability to make significant improvements in function in a reasonable and predictable amount of time.     Precautions / Restrictions Precautions Precautions: Back Precaution Booklet Issued: Yes (comment) Required Braces or Orthoses: Spinal Brace Spinal Brace: Lumbar corset;Applied in standing position Restrictions Weight Bearing Restrictions: No      Mobility  Bed Mobility Overal bed mobility: Modified Independent             General bed mobility comments: increased time, log roll, sidelying to sitting    Transfers Overall transfer level: Independent                       Ambulation/Gait Ambulation/Gait assistance: Modified independent (Device/Increase time) Gait Distance (Feet): 300 Feet Assistive device: None Gait Pattern/deviations: WFL(Within Functional Limits) Gait velocity: functional Gait velocity interpretation: 1.31 - 2.62 ft/sec, indicative of limited community ambulator   General Gait Details: reduced gait speed, otherwise WFL  Stairs Stairs: Yes Stairs assistance: Modified independent (Device/Increase time) Stair Management: One rail Right, Step to pattern Number of Stairs: 3    Wheelchair Mobility    Modified Rankin (Stroke Patients Only)       Balance Overall balance assessment: Mild deficits observed, not formally tested                                           Pertinent Vitals/Pain Pain Assessment Pain Assessment: No/denies pain    Home Living Family/patient expects to be discharged to:: Private residence Living Arrangements: Spouse/significant other;Children Available Help at Discharge: Family;Available 24 hours/day Type of Home: House Home Access: Stairs to enter Entrance Stairs-Rails: Right Entrance Stairs-Number of Steps: 3   Home Layout: One level Home Equipment: Rollator (4 wheels);Cane - single point      Prior Function Prior Level of Function : Independent/Modified Independent;Driving                     Hand Dominance        Extremity/Trunk Assessment   Upper Extremity Assessment Upper Extremity Assessment: Overall WFL for tasks assessed    Lower Extremity Assessment Lower Extremity  Assessment: Overall WFL for tasks assessed    Cervical / Trunk Assessment Cervical / Trunk Assessment: Back Surgery  Communication   Communication: No difficulties  Cognition Arousal/Alertness: Awake/alert Behavior During Therapy: WFL for tasks assessed/performed Overall Cognitive Status: Within Functional Limits for tasks assessed                                           General Comments General comments (skin integrity, edema, etc.): VSS on RA    Exercises     Assessment/Plan    PT Assessment Patient does not need any further PT services  PT Problem List         PT Treatment Interventions      PT Goals (Current goals can be found in the Care Plan section)       Frequency       Co-evaluation               AM-PAC PT "6 Clicks" Mobility  Outcome Measure Help needed turning from your back to your side while in a flat bed without using bedrails?: None Help needed moving from lying on your back to sitting on the side of a flat bed without using bedrails?: None Help needed moving to and from a bed to a chair (including a wheelchair)?: None Help needed standing up from a chair using your arms (e.g., wheelchair or bedside chair)?: None Help needed to walk in hospital room?: None Help needed climbing 3-5 steps with a railing? : None 6 Click Score: 24    End of Session Equipment Utilized During Treatment: Back brace Activity Tolerance: Patient tolerated treatment well Patient left: in bed;with call bell/phone within reach Nurse Communication: Mobility status PT Visit Diagnosis: Other abnormalities of gait and mobility (R26.89)    Time: 5409-8119 PT Time Calculation (min) (ACUTE ONLY): 17 min   Charges:   PT Evaluation $PT Eval Low Complexity: 1 Low          Arlyss Gandy, PT, DPT Acute Rehabilitation Pager: [phone] Office [phone]   Arlyss Gandy 07/22/2021, 9:36 AM

## 2021-07-22 NOTE — Evaluation (Signed)
Occupational Therapy Evaluation and Discharge Patient Details Name: Deborah Kerr MRN: [id number] DOB: Feb 21, 1946 Today's Date: 07/22/2021   History of Present Illness Pt is a 76 year old woman admitted for microlumbar decompression L 4-5, L5-S1 and excision of synovial cyst. PMH: RA, hypothyroidism, HTN.   Clinical Impression   All education completed with and family verbalizing and/or demonstrating understanding. Pt mobilizing slowly, but without physical assist. She has all necessary DME and assist of her family at home. No further OT needs.      Recommendations for follow up therapy are one component of a multi-disciplinary discharge planning process, led by the attending physician.  Recommendations may be updated based on patient status, additional functional criteria and insurance authorization.   Follow Up Recommendations  No OT follow up    Assistance Recommended at Discharge Intermittent Supervision/Assistance  Patient can return home with the following Assistance with cooking/housework    Functional Status Assessment  Patient has had a recent decline in their functional status and demonstrates the ability to make significant improvements in function in a reasonable and predictable amount of time.  Equipment Recommendations  None recommended by OT    Recommendations for Other Services       Precautions / Restrictions Precautions Precautions: Back Precaution Booklet Issued: Yes (comment) Required Braces or Orthoses: Spinal Brace Spinal Brace: Lumbar corset;Applied in standing position Restrictions Weight Bearing Restrictions: No      Mobility Bed Mobility               General bed mobility comments: in chair    Transfers Overall transfer level: Modified independent Equipment used: None               General transfer comment: slow to rise      Balance Overall balance assessment: Mild deficits observed, not formally tested                                          ADL either performed or assessed with clinical judgement   ADL Overall ADL's : Needs assistance/impaired Eating/Feeding: Independent;Sitting   Grooming: Supervision/safety;Standing Grooming Details (indicate cue type and reason): instructed in 2 cup method for oral care and use of washcloth on face to avoid bending Upper Body Bathing: Set up;Sitting Upper Body Bathing Details (indicate cue type and reason): instructed to avoid twisting with washing back, recommended long handled bath sponge Lower Body Bathing: Sit to/from stand;Modified independent   Upper Body Dressing : Set up;Sitting   Lower Body Dressing: Sit to/from stand;Modified independent Lower Body Dressing Details (indicate cue type and reason): pt able to perform figure 4 with LEs to don socks Toilet Transfer: Ambulation;Modified Independent     Toileting - Clothing Manipulation Details (indicate cue type and reason): instructed to avoid twisting with pericare   Tub/Shower Transfer Details (indicate cue type and reason): plans to use walk in shower and shower seat, was getting down in tub Functional mobility during ADLs: Modified independent General ADL Comments: Instructed pt and family in IADLs to avoid.     Vision Baseline Vision/History: 1 Wears glasses Ability to See in Adequate Light: 0 Adequate Patient Visual Report: No change from baseline       Perception     Praxis      Pertinent Vitals/Pain Pain Assessment Pain Assessment: Faces Faces Pain Scale: Hurts a little bit Pain Location: back/incision Pain Descriptors / Indicators:  Discomfort Pain Intervention(s): Monitored during session, Repositioned     Hand Dominance Right   Extremity/Trunk Assessment Upper Extremity Assessment Upper Extremity Assessment: Overall WFL for tasks assessed (arthritic changes in hands)   Lower Extremity Assessment Lower Extremity Assessment: Defer to PT evaluation   Cervical /  Trunk Assessment Cervical / Trunk Assessment: Back Surgery   Communication Communication Communication: No difficulties   Cognition Arousal/Alertness: Awake/alert Behavior During Therapy: WFL for tasks assessed/performed Overall Cognitive Status: Within Functional Limits for tasks assessed                                       General Comments  VSS on RA    Exercises     Shoulder Instructions      Home Living Family/patient expects to be discharged to:: Private residence Living Arrangements: Spouse/significant other;Children Available Help at Discharge: Family;Available 24 hours/day Type of Home: House Home Access: Stairs to enter [employer] of Steps: 3 Entrance Stairs-Rails: Right Home Layout: One level     Bathroom Shower/Tub: Producer, television/film/video: Handicapped height     Home Equipment: Rollator (4 wheels);Cane - single point          Prior Functioning/Environment Prior Level of Function : Independent/Modified Independent;Driving                        OT Problem List:        OT Treatment/Interventions:      OT Goals(Current goals can be found in the care plan section)    OT Frequency:      Co-evaluation              AM-PAC OT "6 Clicks" Daily Activity     Outcome Measure Help from another person eating meals?: None Help from another person taking care of personal grooming?: None Help from another person toileting, which includes using toliet, bedpan, or urinal?: None Help from another person bathing (including washing, rinsing, drying)?: None Help from another person to put on and taking off regular upper body clothing?: None Help from another person to put on and taking off regular lower body clothing?: None 6 Click Score: 24   End of Session Equipment Utilized During Treatment: Back brace  Activity Tolerance: Patient tolerated treatment well Patient left: in chair;with call bell/phone within  reach;with family/visitor present  OT Visit Diagnosis: Unsteadiness on feet (R26.81)                Time: 1610-9604 OT Time Calculation (min): 18 min Charges:  OT General Charges $OT Visit: 1 Visit OT Evaluation $OT Eval Low Complexity: 1 Low  Martie Round, OTR/L Acute Rehabilitation Services Pager: [phone] Office: [phone]  Evern Bio 07/22/2021, 10:31 AM

## 2021-07-22 NOTE — Progress Notes (Signed)
Patient awaiting transport via wheelchair by volunteer for discharge home; in no acute distress nor complaints of pain nor discomfort; incision on her back with Aquacel dressing and is clean, dry and intact; room was checked and accounted for all her belongings; discharge instructions concerning her medications, incision care, follow up appointment and when to call the doctor as needed were all discussed with patient and her family and all expressed understanding on the instructions given.

## 2021-09-01 DIAGNOSIS — M5459 Other low back pain: Secondary | ICD-10-CM | POA: Diagnosis not present

## 2021-09-05 DIAGNOSIS — M5459 Other low back pain: Secondary | ICD-10-CM | POA: Diagnosis not present

## 2021-09-07 DIAGNOSIS — M5459 Other low back pain: Secondary | ICD-10-CM | POA: Diagnosis not present

## 2021-09-08 DIAGNOSIS — C44311 Basal cell carcinoma of skin of nose: Secondary | ICD-10-CM | POA: Diagnosis not present

## 2021-09-13 DIAGNOSIS — M5459 Other low back pain: Secondary | ICD-10-CM | POA: Diagnosis not present

## 2021-09-15 DIAGNOSIS — M5459 Other low back pain: Secondary | ICD-10-CM | POA: Diagnosis not present

## 2021-09-20 DIAGNOSIS — M5459 Other low back pain: Secondary | ICD-10-CM | POA: Diagnosis not present

## 2021-10-05 DIAGNOSIS — Z961 Presence of intraocular lens: Secondary | ICD-10-CM | POA: Diagnosis not present

## 2021-10-05 DIAGNOSIS — Z79899 Other long term (current) drug therapy: Secondary | ICD-10-CM | POA: Diagnosis not present

## 2021-10-05 DIAGNOSIS — M069 Rheumatoid arthritis, unspecified: Secondary | ICD-10-CM | POA: Diagnosis not present

## 2021-10-05 DIAGNOSIS — H02831 Dermatochalasis of right upper eyelid: Secondary | ICD-10-CM | POA: Diagnosis not present

## 2021-10-05 DIAGNOSIS — H04123 Dry eye syndrome of bilateral lacrimal glands: Secondary | ICD-10-CM | POA: Diagnosis not present

## 2021-10-05 DIAGNOSIS — H353131 Nonexudative age-related macular degeneration, bilateral, early dry stage: Secondary | ICD-10-CM | POA: Diagnosis not present

## 2021-10-05 DIAGNOSIS — H02834 Dermatochalasis of left upper eyelid: Secondary | ICD-10-CM | POA: Diagnosis not present

## 2021-10-26 DIAGNOSIS — Z79899 Other long term (current) drug therapy: Secondary | ICD-10-CM | POA: Diagnosis not present

## 2021-10-26 DIAGNOSIS — Z6822 Body mass index (BMI) 22.0-22.9, adult: Secondary | ICD-10-CM | POA: Diagnosis not present

## 2021-10-26 DIAGNOSIS — D702 Other drug-induced agranulocytosis: Secondary | ICD-10-CM | POA: Diagnosis not present

## 2021-10-26 DIAGNOSIS — M5432 Sciatica, left side: Secondary | ICD-10-CM | POA: Diagnosis not present

## 2021-10-26 DIAGNOSIS — M0589 Other rheumatoid arthritis with rheumatoid factor of multiple sites: Secondary | ICD-10-CM | POA: Diagnosis not present

## 2021-10-26 DIAGNOSIS — R768 Other specified abnormal immunological findings in serum: Secondary | ICD-10-CM | POA: Diagnosis not present

## 2021-11-16 DIAGNOSIS — M5451 Vertebrogenic low back pain: Secondary | ICD-10-CM | POA: Diagnosis not present

## 2021-12-29 DIAGNOSIS — Z1231 Encounter for screening mammogram for malignant neoplasm of breast: Secondary | ICD-10-CM | POA: Diagnosis not present

## 2021-12-29 DIAGNOSIS — Z01419 Encounter for gynecological examination (general) (routine) without abnormal findings: Secondary | ICD-10-CM | POA: Diagnosis not present

## 2022-01-16 DIAGNOSIS — E7849 Other hyperlipidemia: Secondary | ICD-10-CM | POA: Diagnosis not present

## 2022-01-16 DIAGNOSIS — E782 Mixed hyperlipidemia: Secondary | ICD-10-CM | POA: Diagnosis not present

## 2022-01-16 DIAGNOSIS — E039 Hypothyroidism, unspecified: Secondary | ICD-10-CM | POA: Diagnosis not present

## 2022-01-16 DIAGNOSIS — I10 Essential (primary) hypertension: Secondary | ICD-10-CM | POA: Diagnosis not present

## 2022-01-16 DIAGNOSIS — M0579 Rheumatoid arthritis with rheumatoid factor of multiple sites without organ or systems involvement: Secondary | ICD-10-CM | POA: Diagnosis not present

## 2022-01-16 DIAGNOSIS — D709 Neutropenia, unspecified: Secondary | ICD-10-CM | POA: Diagnosis not present

## 2022-01-23 DIAGNOSIS — E7849 Other hyperlipidemia: Secondary | ICD-10-CM | POA: Diagnosis not present

## 2022-01-23 DIAGNOSIS — D709 Neutropenia, unspecified: Secondary | ICD-10-CM | POA: Diagnosis not present

## 2022-01-23 DIAGNOSIS — M0579 Rheumatoid arthritis with rheumatoid factor of multiple sites without organ or systems involvement: Secondary | ICD-10-CM | POA: Diagnosis not present

## 2022-01-23 DIAGNOSIS — E039 Hypothyroidism, unspecified: Secondary | ICD-10-CM | POA: Diagnosis not present

## 2022-01-23 DIAGNOSIS — I10 Essential (primary) hypertension: Secondary | ICD-10-CM | POA: Diagnosis not present

## 2022-01-23 DIAGNOSIS — Z6822 Body mass index (BMI) 22.0-22.9, adult: Secondary | ICD-10-CM | POA: Diagnosis not present

## 2022-03-13 DIAGNOSIS — Z1283 Encounter for screening for malignant neoplasm of skin: Secondary | ICD-10-CM | POA: Diagnosis not present

## 2022-03-13 DIAGNOSIS — Z85828 Personal history of other malignant neoplasm of skin: Secondary | ICD-10-CM | POA: Diagnosis not present

## 2022-03-13 DIAGNOSIS — L570 Actinic keratosis: Secondary | ICD-10-CM | POA: Diagnosis not present

## 2022-04-12 DIAGNOSIS — Z23 Encounter for immunization: Secondary | ICD-10-CM | POA: Diagnosis not present

## 2022-04-27 DIAGNOSIS — D702 Other drug-induced agranulocytosis: Secondary | ICD-10-CM | POA: Diagnosis not present

## 2022-04-27 DIAGNOSIS — M5432 Sciatica, left side: Secondary | ICD-10-CM | POA: Diagnosis not present

## 2022-04-27 DIAGNOSIS — Z79899 Other long term (current) drug therapy: Secondary | ICD-10-CM | POA: Diagnosis not present

## 2022-04-27 DIAGNOSIS — R768 Other specified abnormal immunological findings in serum: Secondary | ICD-10-CM | POA: Diagnosis not present

## 2022-04-27 DIAGNOSIS — Z6822 Body mass index (BMI) 22.0-22.9, adult: Secondary | ICD-10-CM | POA: Diagnosis not present

## 2022-04-27 DIAGNOSIS — M0589 Other rheumatoid arthritis with rheumatoid factor of multiple sites: Secondary | ICD-10-CM | POA: Diagnosis not present

## 2022-06-20 ENCOUNTER — Inpatient Hospital Stay (HOSPITAL_COMMUNITY)
Admission: EM | Admit: 2022-06-20 | Discharge: 2022-06-26 | DRG: 064 | Disposition: A | Discharge status: Home / Self Care | Payer: Medicare PPO | Attending: Neurology | Admitting: Neurology

## 2022-06-20 ENCOUNTER — Other Ambulatory Visit: Payer: Self-pay

## 2022-06-20 ENCOUNTER — Emergency Department (HOSPITAL_COMMUNITY): Payer: Medicare PPO

## 2022-06-20 ENCOUNTER — Inpatient Hospital Stay (HOSPITAL_COMMUNITY): Payer: Medicare PPO

## 2022-06-20 ENCOUNTER — Encounter (HOSPITAL_COMMUNITY): Payer: Self-pay

## 2022-06-20 ENCOUNTER — Inpatient Hospital Stay: Payer: Self-pay

## 2022-06-20 DIAGNOSIS — G8191 Hemiplegia, unspecified affecting right dominant side: Secondary | ICD-10-CM | POA: Diagnosis present

## 2022-06-20 DIAGNOSIS — I619 Nontraumatic intracerebral hemorrhage, unspecified: Secondary | ICD-10-CM | POA: Diagnosis present

## 2022-06-20 DIAGNOSIS — E890 Postprocedural hypothyroidism: Secondary | ICD-10-CM | POA: Diagnosis not present

## 2022-06-20 DIAGNOSIS — E876 Hypokalemia: Secondary | ICD-10-CM | POA: Diagnosis not present

## 2022-06-20 DIAGNOSIS — Z79890 Hormone replacement therapy (postmenopausal): Secondary | ICD-10-CM | POA: Diagnosis not present

## 2022-06-20 DIAGNOSIS — H547 Unspecified visual loss: Secondary | ICD-10-CM | POA: Diagnosis present

## 2022-06-20 DIAGNOSIS — M069 Rheumatoid arthritis, unspecified: Secondary | ICD-10-CM | POA: Diagnosis not present

## 2022-06-20 DIAGNOSIS — Z79899 Other long term (current) drug therapy: Secondary | ICD-10-CM | POA: Diagnosis not present

## 2022-06-20 DIAGNOSIS — I161 Hypertensive emergency: Secondary | ICD-10-CM | POA: Diagnosis not present

## 2022-06-20 DIAGNOSIS — I618 Other nontraumatic intracerebral hemorrhage: Secondary | ICD-10-CM | POA: Diagnosis not present

## 2022-06-20 DIAGNOSIS — Z888 Allergy status to other drugs, medicaments and biological substances status: Secondary | ICD-10-CM | POA: Diagnosis not present

## 2022-06-20 DIAGNOSIS — R519 Headache, unspecified: Secondary | ICD-10-CM | POA: Insufficient documentation

## 2022-06-20 DIAGNOSIS — S0083XA Contusion of other part of head, initial encounter: Secondary | ICD-10-CM | POA: Diagnosis not present

## 2022-06-20 DIAGNOSIS — R29705 NIHSS score 5: Secondary | ICD-10-CM | POA: Diagnosis present

## 2022-06-20 DIAGNOSIS — I6389 Other cerebral infarction: Secondary | ICD-10-CM

## 2022-06-20 DIAGNOSIS — G935 Compression of brain: Secondary | ICD-10-CM | POA: Insufficient documentation

## 2022-06-20 DIAGNOSIS — Z1152 Encounter for screening for COVID-19: Secondary | ICD-10-CM

## 2022-06-20 DIAGNOSIS — R4701 Aphasia: Secondary | ICD-10-CM | POA: Diagnosis present

## 2022-06-20 DIAGNOSIS — I615 Nontraumatic intracerebral hemorrhage, intraventricular: Secondary | ICD-10-CM | POA: Diagnosis not present

## 2022-06-20 DIAGNOSIS — M1990 Unspecified osteoarthritis, unspecified site: Secondary | ICD-10-CM | POA: Diagnosis present

## 2022-06-20 DIAGNOSIS — G936 Cerebral edema: Secondary | ICD-10-CM | POA: Diagnosis not present

## 2022-06-20 DIAGNOSIS — I447 Left bundle-branch block, unspecified: Secondary | ICD-10-CM | POA: Diagnosis present

## 2022-06-20 DIAGNOSIS — R9082 White matter disease, unspecified: Secondary | ICD-10-CM | POA: Diagnosis not present

## 2022-06-20 DIAGNOSIS — E039 Hypothyroidism, unspecified: Secondary | ICD-10-CM | POA: Insufficient documentation

## 2022-06-20 DIAGNOSIS — Z8673 Personal history of transient ischemic attack (TIA), and cerebral infarction without residual deficits: Secondary | ICD-10-CM | POA: Diagnosis not present

## 2022-06-20 DIAGNOSIS — R531 Weakness: Secondary | ICD-10-CM

## 2022-06-20 DIAGNOSIS — I10 Essential (primary) hypertension: Secondary | ICD-10-CM | POA: Insufficient documentation

## 2022-06-20 DIAGNOSIS — I639 Cerebral infarction, unspecified: Secondary | ICD-10-CM | POA: Diagnosis not present

## 2022-06-20 DIAGNOSIS — I63233 Cerebral infarction due to unspecified occlusion or stenosis of bilateral carotid arteries: Secondary | ICD-10-CM | POA: Diagnosis not present

## 2022-06-20 LAB — ECHOCARDIOGRAM COMPLETE
AR max vel: 2.88 cm2
AV Area VTI: 2.8 cm2
AV Area mean vel: 2.84 cm2
AV Mean grad: 7 mmHg
AV Peak grad: 13.2 mmHg
Ao pk vel: 1.82 m/s
Area-P 1/2: 4.15 cm2
Calc EF: 74.8 %
MV VTI: 2.75 cm2
S' Lateral: 2 cm
Single Plane A2C EF: 77.7 %
Single Plane A4C EF: 71.1 %

## 2022-06-20 LAB — CBC WITH DIFFERENTIAL/PLATELET
Abs Immature Granulocytes: 0.07 10*3/uL (ref 0.00–0.07)
Basophils Absolute: 0 10*3/uL (ref 0.0–0.1)
Basophils Relative: 0 %
Eosinophils Absolute: 0 10*3/uL (ref 0.0–0.5)
Eosinophils Relative: 0 %
HCT: 35.9 % — ABNORMAL LOW (ref 36.0–46.0)
Hemoglobin: 12 g/dL (ref 12.0–15.0)
Immature Granulocytes: 1 %
Lymphocytes Relative: 5 %
Lymphs Abs: 0.6 10*3/uL — ABNORMAL LOW (ref 0.7–4.0)
MCH: 30.2 pg (ref 26.0–34.0)
MCHC: 33.4 g/dL (ref 30.0–36.0)
MCV: 90.2 fL (ref 80.0–100.0)
Monocytes Absolute: 0.9 10*3/uL (ref 0.1–1.0)
Monocytes Relative: 8 %
Neutro Abs: 9.9 10*3/uL — ABNORMAL HIGH (ref 1.7–7.7)
Neutrophils Relative %: 86 %
Platelets: 241 10*3/uL (ref 150–400)
RBC: 3.98 MIL/uL (ref 3.87–5.11)
RDW: 13.4 % (ref 11.5–15.5)
WBC: 11.6 10*3/uL — ABNORMAL HIGH (ref 4.0–10.5)
nRBC: 0 % (ref 0.0–0.2)

## 2022-06-20 LAB — COMPREHENSIVE METABOLIC PANEL
ALT: 14 U/L (ref 0–44)
AST: 24 U/L (ref 15–41)
Albumin: 4.3 g/dL (ref 3.5–5.0)
Alkaline Phosphatase: 60 U/L (ref 38–126)
Anion gap: 13 (ref 5–15)
BUN: 27 mg/dL — ABNORMAL HIGH (ref 8–23)
CO2: 24 mmol/L (ref 22–32)
Calcium: 8 mg/dL — ABNORMAL LOW (ref 8.9–10.3)
Chloride: 96 mmol/L — ABNORMAL LOW (ref 98–111)
Creatinine, Ser: 0.6 mg/dL (ref 0.44–1.00)
GFR, Estimated: >60 mL/min (ref >60)
Glucose, Bld: 128 mg/dL — ABNORMAL HIGH (ref 70–99)
Potassium: 2.9 mmol/L — ABNORMAL LOW (ref 3.5–5.1)
Sodium: 133 mmol/L — ABNORMAL LOW (ref 135–145)
Total Bilirubin: 0.8 mg/dL (ref 0.3–1.2)
Total Protein: 7.5 g/dL (ref 6.5–8.1)

## 2022-06-20 LAB — TSH: TSH: 0.178 u[IU]/mL — ABNORMAL LOW (ref 0.350–4.500)

## 2022-06-20 LAB — RESP PANEL BY RT-PCR (RSV, FLU A&B, COVID)  RVPGX2
Influenza A by PCR: NEGATIVE
Influenza B by PCR: NEGATIVE
Resp Syncytial Virus by PCR: NEGATIVE
SARS Coronavirus 2 by RT PCR: NEGATIVE

## 2022-06-20 LAB — SODIUM
Sodium: 136 mmol/L (ref 135–145)
Sodium: 139 mmol/L (ref 135–145)
Sodium: 141 mmol/L (ref 135–145)

## 2022-06-20 LAB — GLUCOSE, CAPILLARY: Glucose-Capillary: 105 mg/dL — ABNORMAL HIGH (ref 70–99)

## 2022-06-20 LAB — MRSA NEXT GEN BY PCR, NASAL: MRSA by PCR Next Gen: NOT DETECTED

## 2022-06-20 LAB — PROTIME-INR
INR: 1.1 (ref 0.8–1.2)
Prothrombin Time: 14.3 seconds (ref 11.4–15.2)

## 2022-06-20 LAB — LIPASE, BLOOD: Lipase: 29 U/L (ref 11–51)

## 2022-06-20 MED ORDER — CLEVIDIPINE BUTYRATE 0.5 MG/ML IV EMUL
0.0000 mg/h | INTRAVENOUS | Status: DC
  Administered 2022-06-20: 2 mg/h via INTRAVENOUS
  Administered 2022-06-20: 6 mg/h via INTRAVENOUS
  Administered 2022-06-21: 4 mg/h via INTRAVENOUS
  Administered 2022-06-21: 5 mg/h via INTRAVENOUS
  Administered 2022-06-21 – 2022-06-22 (×3): 3 mg/h via INTRAVENOUS
  Administered 2022-06-22: 4 mg/h via INTRAVENOUS
  Administered 2022-06-23 (×2): 2 mg/h via INTRAVENOUS
  Administered 2022-06-23: 3 mg/h via INTRAVENOUS
  Filled 2022-06-20 (×13): qty 50

## 2022-06-20 MED ORDER — SODIUM CHLORIDE 0.9 % IV BOLUS
1000.0000 mL | Freq: Once | INTRAVENOUS | Status: AC
  Administered 2022-06-20: 1000 mL via INTRAVENOUS

## 2022-06-20 MED ORDER — SODIUM CHLORIDE 3 % IV SOLN
INTRAVENOUS | Status: DC
  Filled 2022-06-20 (×17): qty 500

## 2022-06-20 MED ORDER — ORAL CARE MOUTH RINSE: 15.0000 mL | OROMUCOSAL | Status: DC | PRN

## 2022-06-20 MED ORDER — GADOBUTROL 1 MMOL/ML IV SOLN
6.0000 mL | Freq: Once | INTRAVENOUS | Status: AC | PRN
  Administered 2022-06-20: 6 mL via INTRAVENOUS

## 2022-06-20 MED ORDER — SENNOSIDES-DOCUSATE SODIUM 8.6-50 MG PO TABS
1.0000 | ORAL_TABLET | Freq: Two times a day (BID) | ORAL | Status: DC
  Administered 2022-06-20 – 2022-06-25 (×8): 1 via ORAL
  Filled 2022-06-20 (×8): qty 1

## 2022-06-20 MED ORDER — PANTOPRAZOLE SODIUM 40 MG IV SOLR
40.0000 mg | Freq: Every day | INTRAVENOUS | Status: DC
  Administered 2022-06-20 – 2022-06-22 (×3): 40 mg via INTRAVENOUS
  Filled 2022-06-20 (×3): qty 10

## 2022-06-20 MED ORDER — ACETAMINOPHEN 325 MG PO TABS
650.0000 mg | ORAL_TABLET | ORAL | Status: DC | PRN
  Administered 2022-06-20 – 2022-06-26 (×15): 650 mg via ORAL
  Filled 2022-06-20 (×15): qty 2

## 2022-06-20 MED ORDER — SODIUM CHLORIDE 0.9% FLUSH
10.0000 mL | Freq: Two times a day (BID) | INTRAVENOUS | Status: DC
  Administered 2022-06-20 – 2022-06-24 (×9): 10 mL
  Administered 2022-06-25: 20 mL
  Administered 2022-06-25 – 2022-06-26 (×2): 10 mL

## 2022-06-20 MED ORDER — ACETAMINOPHEN 160 MG/5ML PO SOLN: 650.0000 mg | ORAL | Status: DC | PRN

## 2022-06-20 MED ORDER — SODIUM CHLORIDE 0.9% FLUSH: 10.0000 mL | INTRAVENOUS | Status: DC | PRN

## 2022-06-20 MED ORDER — ACETAMINOPHEN 650 MG RE SUPP: 650.0000 mg | RECTAL | Status: DC | PRN

## 2022-06-20 MED ORDER — CHLORHEXIDINE GLUCONATE CLOTH 2 % EX PADS
6.0000 | MEDICATED_PAD | Freq: Every day | CUTANEOUS | Status: DC
  Administered 2022-06-20 – 2022-06-26 (×7): 6 via TOPICAL

## 2022-06-20 MED ORDER — POTASSIUM CHLORIDE 10 MEQ/100ML IV SOLN
10.0000 meq | INTRAVENOUS | Status: AC
  Administered 2022-06-20 – 2022-06-21 (×6): 10 meq via INTRAVENOUS
  Filled 2022-06-20 (×6): qty 100

## 2022-06-20 MED ORDER — ONDANSETRON 4 MG PO TBDP
4.0000 mg | ORAL_TABLET | Freq: Once | ORAL | Status: AC
  Administered 2022-06-20: 4 mg via ORAL
  Filled 2022-06-20: qty 1

## 2022-06-20 MED ORDER — SODIUM CHLORIDE 3 % IV BOLUS
250.0000 mL | Freq: Once | INTRAVENOUS | Status: AC
  Administered 2022-06-20: 250 mL via INTRAVENOUS
  Filled 2022-06-20: qty 500

## 2022-06-20 MED ORDER — STROKE: EARLY STAGES OF RECOVERY BOOK
Freq: Once | Status: AC
  Filled 2022-06-20: qty 1

## 2022-06-20 MED ORDER — POTASSIUM CHLORIDE 10 MEQ/100ML IV SOLN
10.0000 meq | INTRAVENOUS | Status: AC
  Administered 2022-06-20 (×2): 10 meq via INTRAVENOUS
  Filled 2022-06-20 (×2): qty 100

## 2022-06-20 MED ORDER — ONDANSETRON HCL 4 MG/2ML IJ SOLN
4.0000 mg | Freq: Four times a day (QID) | INTRAMUSCULAR | Status: AC | PRN
  Administered 2022-06-20: 4 mg via INTRAVENOUS
  Filled 2022-06-20: qty 2

## 2022-06-20 MED ORDER — FENTANYL CITRATE PF 50 MCG/ML IJ SOSY
25.0000 ug | PREFILLED_SYRINGE | Freq: Once | INTRAMUSCULAR | Status: AC
  Administered 2022-06-20: 25 ug via INTRAVENOUS
  Filled 2022-06-20: qty 1

## 2022-06-20 MED ORDER — IOHEXOL 350 MG/ML SOLN
75.0000 mL | Freq: Once | INTRAVENOUS | Status: AC | PRN
  Administered 2022-06-20: 75 mL via INTRAVENOUS

## 2022-06-20 NOTE — Progress Notes (Signed)
  Echocardiogram 2D Echocardiogram has been performed.  Deborah Kerr 06/20/2022, 3:19 PM

## 2022-06-20 NOTE — ED Notes (Signed)
Report given to [facility] RN.  

## 2022-06-20 NOTE — H&P (Signed)
Please see consult note from me from earlier today for H and P.  Erick Blinks [facility] Pager Number [phone]

## 2022-06-20 NOTE — Progress Notes (Signed)
Peripherally Inserted Central Catheter Placement  The IV Nurse has discussed with the patient and/or persons authorized to consent for the patient, the purpose of this procedure and the potential benefits and risks involved with this procedure.  The benefits include less needle sticks, lab draws from the catheter, and the patient may be discharged home with the catheter. Risks include, but not limited to, infection, bleeding, blood clot (thrombus formation), and puncture of an artery; nerve damage and irregular heartbeat and possibility to perform a PICC exchange if needed/ordered by physician.  Alternatives to this procedure were also discussed.  Bard Power PICC patient education guide, fact sheet on infection prevention and patient information card has been provided to patient /or left at bedside.    PICC Placement Documentation  PICC Double Lumen 06/20/22 Right Basilic 37 cm 0 cm (Active)  Indication for Insertion or Continuance of Line Administration of hyperosmolar/irritating solutions (i.e. TPN, Vancomycin, etc.) 06/20/22 1737  Exposed Catheter (cm) 0 cm 06/20/22 1737  Site Assessment Clean, Dry, Intact 06/20/22 1737  Lumen #1 Status Flushed;Saline locked;Blood return noted 06/20/22 1737  Lumen #2 Status Flushed;Saline locked;Blood return noted 06/20/22 1737  Dressing Type Transparent;Securing device 06/20/22 1737  Dressing Status Antimicrobial disc in place;Clean, Dry, Intact 06/20/22 1737  Safety Lock Not Applicable 06/20/22 1737  Line Adjustment (NICU/IV Team Only) No 06/20/22 1737  Dressing Intervention New dressing;Other (Comment) 06/20/22 1737  Dressing Change Due 06/27/22 06/20/22 1737       Annett Fabian 06/20/2022, 5:39 PM

## 2022-06-20 NOTE — Progress Notes (Addendum)
Neurology telephone note  I was contacted by Dr. Wallace Cullens about this patient who presented to [facility] ED with 3 days of R sided weakness, aphasia, headache, emesis that started Sunday afternoon. CT head personal review shows acute L temporo-occipital IPH 5.5 x 3.8 x 2.4cm (11ml volume) with small amount IVE, borderline ventriculomegaly. Patient is not on anticoagulation. SBP 190s and was started on clevidipine. ED already contacted neurosurgery who agreed with transfer to [facility], stated no acute intervention at this time although they would be available if that changed.  Recommendations: - STAT transfer to [facility]. May direct admit to neuro ICU if bed available, otherwise ED to ED. Admitting physician to ICU will be Dr. Marchelle Folks - Clevidipine for goal SBP <150 - SCDs for DVT prophylaxis - Head CT q 6 hrs assess stability of ICH - HOB elevated 30 degrees - Hypertonic saline 3% continuous PIV per protocol - NSU following consulted; no acute intervention at this time  Please notify Dr. Derry Lory when patient arrives at [facility]. Page me for any questions or concerns that develop while patient is still at [facility].    Bing Neighbors, MD [facility] [phone]   If 7pm- 7am, please page neurology on call as listed in AMION.

## 2022-06-20 NOTE — ED Triage Notes (Signed)
Pt presents c/o weakness and emesis that started Sunday afternoon. Pt not been able to eat or drink much since.

## 2022-06-20 NOTE — Consult Note (Addendum)
Admission H&P    Chief Complaint: headache HPI: Deborah Kerr is an 76 y.o. female with HTN, thyroid disease, arthritis, LBBB who states she was in her normal state, out shopping with husband on Sunday 12/17 and had sudden onset of headache, N/V. She has felt gen unwell since, unable to tolerate much activity or food. She became gen weak and husband noted word finding difficulty which prompted them to finally seek medical attention at [facility]. No falls or trauma reported. CTH showed large left temporal ICH with some IVH, volume ~50ml.  LSN: 12/17 3pm TNK Given: NO, ICH  ICH score 1 Baseline mRS 0  Past Medical History Past Medical History:  Diagnosis Date   Arthritis    rheumatoid arthritis   Hypertension    Hypothyroidism 2009   Left bundle branch block    Thyroid disease     Past Surgical History Past Surgical History:  Procedure Laterality Date   APPENDECTOMY     per patient 1970s   COLONOSCOPY     COLONOSCOPY N/A 05/18/2015   Procedure: COLONOSCOPY;  Surgeon: Corbin Ade, MD;  Location: [facility]ITE;  Service: Endoscopy;  Laterality: N/A;  11:30   EYE SURGERY Bilateral 2021   cataract surgery   HARDWARE REMOVAL Left 12/22/2020   Procedure: HARDWARE REMOVAL LEFT ANKLE;  Surgeon: Roby Lofts, MD;  Location: [facility] OR;  Service: Orthopedics;  Laterality: Left;   LUMBAR LAMINECTOMY/DECOMPRESSION MICRODISCECTOMY N/A 07/21/2021   Procedure: Microlumbar decompression Lumbar Four-Five and Lumbar Five - Sacral One, excision of synovial cyst;  Surgeon: Jene Every, MD;  Location: [facility] OR;  Service: Orthopedics;  Laterality: N/A;   OOPHORECTOMY     ORIF ANKLE FRACTURE Left 06/24/2020   Procedure: OPEN REDUCTION INTERNAL FIXATION (ORIF) ANKLE FRACTURE;  Surgeon: Roby Lofts, MD;  Location: [facility] OR;  Service: Orthopedics;  Laterality: Left;   Thyroidectomy  2006   TUBAL LIGATION      Family History History reviewed. No pertinent family history.  Social History  reports that  she has never smoked. She has never used smokeless tobacco. She reports that she does not drink alcohol and does not use drugs.  Allergies Allergies  Allergen Reactions   Orencia [Abatacept]     Coughing, palm of hands itch     Home Medications Medications Prior to Admission  Medication Sig Dispense Refill   acetaminophen (TYLENOL) 500 MG tablet Take 1,000 mg by mouth every 6 (six) hours as needed for moderate pain or headache.     Calcium Carbonate-Vitamin D (CALCIUM 600+D PO) Take 1 tablet by mouth 2 (two) times daily.     docusate sodium (COLACE) 100 MG capsule Take 1 capsule (100 mg total) by mouth 2 (two) times daily as needed for mild constipation. 30 capsule 1   leflunomide (ARAVA) 20 MG tablet Take 20 mg by mouth daily.     levothyroxine (SYNTHROID) 125 MCG tablet Take 125 mcg by mouth daily before breakfast.     losartan (COZAAR) 100 MG tablet Take 100 mg by mouth daily.     metoprolol succinate (TOPROL-XL) 50 MG 24 hr tablet Take 50 mg by mouth in the morning and at bedtime.     oxyCODONE (OXY IR/ROXICODONE) 5 MG immediate release tablet Take 1 tablet (5 mg total) by mouth every 4 (four) hours as needed for severe pain. 40 tablet 0   polyethylene glycol (MIRALAX / GLYCOLAX) 17 g packet Take 17 g by mouth daily. 14 each 0   potassium chloride  SA (KLOR-CON) 20 MEQ tablet Take 10 mEq by mouth daily.      Hospital Medications  [START ON 06/21/2022]  stroke: early stages of recovery book   Does not apply Once   Chlorhexidine Gluconate Cloth  6 each Topical Daily   pantoprazole (PROTONIX) IV  40 mg Intravenous QHS   senna-docusate  1 tablet Oral BID    ROS:  History obtained from the patient  General ROS: Positive fatigue, general unwell feeling x 3d. Negative for - chills, fever, night sweats, weight gain or weight loss Psychological ROS: negative for - behavioral disorder, hallucinations, memory difficulties, mood swings or suicidal ideation Ophthalmic ROS: negative for -  blurry vision, double vision, eye pain or loss of vision ENT ROS: negative for - epistaxis, nasal discharge, oral lesions, sore throat, tinnitus or vertigo Allergy and Immunology ROS: negative for - hives or itchy/watery eyes Hematological and Lymphatic ROS: negative for - bleeding problems, bruising or swollen lymph nodes Endocrine ROS: negative for - galactorrhea, hair pattern changes, polydipsia/polyuria or temperature intolerance Respiratory ROS: negative for - cough, hemoptysis, shortness of breath or wheezing Cardiovascular ROS: negative for - chest pain, dyspnea on exertion, edema or irregular heartbeat Gastrointestinal ROS: Positive nausea/vomiting. Negative for - abdominal pain, diarrhea, hematemesis,  or stool incontinence Genito-Urinary ROS: negative for - dysuria, hematuria, incontinence or urinary frequency/urgency Musculoskeletal ROS: negative for - joint swelling or muscular weakness Neurological ROS: as noted in HPI: aphasia, general weakness, headaches Dermatological ROS: negative for rash and skin lesion changes   Physical Examination:  Vitals:   06/20/22 1215 06/20/22 1230 06/20/22 1245 06/20/22 1300  BP: (!) 142/75 130/68 115/62 119/66  Pulse: 96 100 92 88  Resp: (!) 31 (!) 26 (!) 28 (!) 25  Temp:      TempSrc:      SpO2: 97% 97% 98% 95%    General - Elderly female, mild distress, lying in bed Heart - Regular rate and rhythm - no murmer Lungs - Clear to auscultation Abdomen - Soft - non tender Extremities - Distal pulses intact - no edema Skin - Warm and dry   Neurologic Examination:   Mental Status:  Alert, oriented, thought content appropriate. Fluent expressive Aphasia with word finding difficulty. No dysarthria. Able to follow 3 step commands without difficulty.  Cranial Nerves:  II-bilateral visual fields intact III/IV/VI-Pupils were equal and reacted. Extraocular movements were full.  V/VII-no facial numbness and no facial weakness.  VIII-hearing  normal.  X-normal speech and symmetrical palatal movement.  XII-midline tongue extension  Motor: Right : Upper extremity   5/5    Left:     Upper extremity   5/5  Lower extremity   5/5     Lower extremity   5/5 Tone and bulk:normal tone throughout; no atrophy noted Sensory: Intact to light touch in all extremities. Deep Tendon Reflexes: 2/4 throughout Plantars: Downgoing bilaterally  Cerebellar: Normal FNF and heel to shin bilaterally. Gait: not tested   LABORATORY STUDIES   Basic Metabolic Panel: Recent Labs  Lab 06/20/22 0736 06/20/22 1037  NA 133* 136  K 2.9*  --   CL 96*  --   CO2 24  --   GLUCOSE 128*  --   BUN 27*  --   CREATININE 0.60  --   CALCIUM 8.0*  --     Liver Function Tests: Recent Labs  Lab 06/20/22 0736  AST 24  ALT 14  ALKPHOS 60  BILITOT 0.8  PROT 7.5  ALBUMIN 4.3  Recent Labs  Lab 06/20/22 0736  LIPASE 29    CBC: Recent Labs  Lab 06/20/22 0737  WBC 11.6*  NEUTROABS 9.9*  HGB 12.0  HCT 35.9*  MCV 90.2  PLT 241   CBG: Recent Labs  Lab 06/20/22 1213  GLUCAP 105*   Coagulation Studies: Recent Labs    06/20/22 0901  LABPROT 14.3  INR 1.1    IMAGING [place] EKG SITE RITE  Result Date: 06/20/2022 If Site Rite image not attached, placement could not be confirmed due to current cardiac rhythm.  CT Head Wo Contrast  Addendum Date: 06/20/2022   ADDENDUM REPORT: 06/20/2022 09:09 ADDENDUM: Findings discussed with Dr. Wallace Cullens via telephone at 9:07 a.m. Electronically Signed   By: Feliberto Harts M.D.   On: 06/20/2022 09:09   Result Date: 06/20/2022 CLINICAL DATA:  Headache, new onset (Age >= 51y) sudden onset, aphasia/weakness/n/v EXAM: CT HEAD WITHOUT CONTRAST TECHNIQUE: Contiguous axial images were obtained from the base of the skull through the vertex without intravenous contrast. RADIATION DOSE REDUCTION: This exam was performed according to the departmental dose-optimization program which includes automated exposure control,  adjustment of the mA and/or kV according to patient size and/or use of iterative reconstruction technique. COMPARISON:  None Available. FINDINGS: Brain: Acute intraparenchymal hemorrhage centered in left posterior temporal/occipital region, measuring up to approximately 5.5 x 3.8 x 2.4 cm (estimated volume of 25 mL). Mild surrounding edema. Regional mass effect. No midline shift. Small volume of intraventricular extension into the atrium of the left lateral ventricle. The lateral ventricle is effaced on the left. Borderline ventriculomegaly. No evidence of acute large vascular territory infarct or visible mass lesion. Partially empty sella. Vascular: No hyperdense vessel identified. Skull: No acute fracture. Sinuses/Orbits: Clear sinuses.  No acute orbital findings. Other: No mastoid effusions. IMPRESSION: 1. Acute intraparenchymal hemorrhage centered in left posterior temporal/occipital region, measuring up to approximately 5.5 x 3.8 x 2.4 cm (estimated volume of 25 mL). Small volume of intraventricular extension into the atrium of the left lateral ventricle. 2. Borderline ventriculomegaly.  Recommend attention on follow-up. Electronically Signed: By: Feliberto Harts M.D. On: 06/20/2022 08:59    Assessment: 76 y.o. female with HTN, thyroid disease, arthritis, LBBB who states she was in her normal state, out shopping with husband on Sunday 12/17 and had sudden onset of headache, N/V. She has felt gen unwell since, unable to tolerate much activity or food. She became gen weak and husband noted word finding difficulty which prompted them to finally seek medical attention at [facility]. No falls or trauma reported. CTH showed large left temporal ICH with some IVH, volume ~32ml. No hydrocephalus at this time, GCS 15. ICH score 1. Stroke Risk Factors - HTN.  # ICH with IVH, HTN is likely etiology, further imaging planned for better etiology # Cerebral edema- d/t above # Other vascular headache- d/t above #Brain  compression- d/t above. # Expressive Aphasia- d/t above # N/V- d/t above # HTN # Thyroid disease  Plan: Admit to ICU for greater than 2 MN for ICH care CTA head for vessel imaging and stability scan at 1p MRI brain w/wo Tylenol  PO PRN for h/a Zofran  SL/IV PRN for n/v Check TSH Check U/A PICC line for hypertonic Na Hypertonic NA 3% at 75cc/hr. Na serum checks Q6h for goal to 150-155. PT consult, OT consult, Speech consult PPI for ppx SCDs for ppx, no chemoppx d/t ICH NPO for now till repeat CTH incase NSGY needed NSGY have been made aware incase she  develops hydrocephalus or worsens Risk factor modification Telemetry monitoring HTN goal < 150 IV Cleveprex for BP goal, add back home po meds when able Frequent neuro checks Q2h  Attending Neurologist's Note to Follow Desiree Cihelka, PhDc, ARNP-C, ANVP-BC Please contact via AMION   NEUROHOSPITALIST ADDENDUM Performed a face to face diagnostic evaluation.   I have reviewed the contents of history and physical exam as documented by PA/ARNP/Resident and agree with above documentation.  I have discussed and formulated the above plan as documented. Edits to the note have been made as needed.  Impression/Key exam findings/Plan:2F p/w aphasia and hypertensive and found to have L temporal ICH with small IVH. ICH score of 1 for IVH. Started on Cleviprex with goal SBP 130-150. Not on anticoagulation. No hx of falls, no prior hx of ICH.  Will need close monitoring in the ICU, repeat CTH in 6 hours and will also get CTA at that time to ensure that ICH is not worsening and no obvious AVM. Will need MRI Brain w + w/o C to evaluate for an underlying lesion.  In addition, will start Hypertonic saline given the size of ICH. Goal sodium 150-155.  This patient is critically ill and at significant risk of neurological worsening, death and care requires constant monitoring of vital signs, hemodynamics,respiratory and cardiac  monitoring, neurological assessment, discussion with family, other specialists and medical decision making of high complexity. I spent 40 minutes of neurocritical care time  in the care of  this patient. This was time spent independent of any time provided by nurse practitioner or PA.  Erick Blinks [facility] Pager Number [phone] 06/20/2022  2:49 PM   Erick Blinks, MD [facility] [phone]   If 7pm to 7am, please call on call as listed on AMION.

## 2022-06-20 NOTE — ED Provider Notes (Signed)
[facility] EMERGENCY DEPARTMENT Provider Note   CSN: [id number] Arrival date & time: 06/20/22  0430     History  Chief Complaint  Patient presents with   Weakness   Emesis    Deborah Kerr is a 76 y.o. female.  Patient as above with significant medical history as below, including htn, lbbb, RA, hypothyroid who presents to the ED with complaint of headache/n/v/speech diff Sudden onset headache Sunday, not like her typical ha No change with otc analgesia Began to have n/v with the ha Sunday afternoon, word finding difficulty, global weakness No gait change but feeling very weak/tired since onset of ha Has been intermittent since the onset, no relief with otc medications No thinners, no head trauma      Past Medical History:  Diagnosis Date   Arthritis    rheumatoid arthritis   Hypertension    Hypothyroidism 2009   Left bundle branch block    Thyroid disease     Past Surgical History:  Procedure Laterality Date   APPENDECTOMY     per patient 1970s   COLONOSCOPY     COLONOSCOPY N/A 05/18/2015   Procedure: COLONOSCOPY;  Surgeon: Corbin Ade, MD;  Location: [facility];  Service: Endoscopy;  Laterality: N/A;  11:30   EYE SURGERY Bilateral 2021   cataract surgery   HARDWARE REMOVAL Left 12/22/2020   Procedure: HARDWARE REMOVAL LEFT ANKLE;  Surgeon: Roby Lofts, MD;  Location: [facility] OR;  Service: Orthopedics;  Laterality: Left;   LUMBAR LAMINECTOMY/DECOMPRESSION MICRODISCECTOMY N/A 07/21/2021   Procedure: Microlumbar decompression Lumbar Four-Five and Lumbar Five - Sacral One, excision of synovial cyst;  Surgeon: Jene Every, MD;  Location: [facility] OR;  Service: Orthopedics;  Laterality: N/A;   OOPHORECTOMY     ORIF ANKLE FRACTURE Left 06/24/2020   Procedure: OPEN REDUCTION INTERNAL FIXATION (ORIF) ANKLE FRACTURE;  Surgeon: Roby Lofts, MD;  Location: [facility] OR;  Service: Orthopedics;  Laterality: Left;   Thyroidectomy  2006   TUBAL LIGATION       The history  is provided by the patient. No language interpreter was used.  Weakness Associated symptoms: headaches, nausea and vomiting   Associated symptoms: no abdominal pain, no chest pain, no cough, no fever and no shortness of breath   Emesis Associated symptoms: headaches   Associated symptoms: no abdominal pain, no chills, no cough and no fever        Home Medications Prior to Admission medications   Medication Sig Start Date End Date Taking? Authorizing Provider  acetaminophen (TYLENOL) 500 MG tablet Take 1,000 mg by mouth every 6 (six) hours as needed for moderate pain or headache.    [provider]  Calcium Carbonate-Vitamin D (CALCIUM 600+D PO) Take 1 tablet by mouth 2 (two) times daily.    [provider]  docusate sodium (COLACE) 100 MG capsule Take 1 capsule (100 mg total) by mouth 2 (two) times daily as needed for mild constipation. 07/21/21   Jene Every, MD  leflunomide (ARAVA) 20 MG tablet Take 20 mg by mouth daily.    [provider]  levothyroxine (SYNTHROID) 125 MCG tablet Take 125 mcg by mouth daily before breakfast.    [provider]  losartan (COZAAR) 100 MG tablet Take 100 mg by mouth daily.    [provider]  metoprolol succinate (TOPROL-XL) 50 MG 24 hr tablet Take 50 mg by mouth in the morning and at bedtime.    [provider]  oxyCODONE (OXY IR/ROXICODONE) 5  MG immediate release tablet Take 1 tablet (5 mg total) by mouth every 4 (four) hours as needed for severe pain. 07/21/21   Jene Every, MD  polyethylene glycol (MIRALAX / GLYCOLAX) 17 g packet Take 17 g by mouth daily. 07/21/21   Jene Every, MD  potassium chloride SA (KLOR-CON) 20 MEQ tablet Take 10 mEq by mouth daily.    [provider]      Allergies    Orencia [abatacept]    Review of Systems   Review of Systems  Constitutional:  Negative for chills and fever.  HENT:  Negative for facial swelling and trouble swallowing.   Eyes:  Negative  for photophobia and visual disturbance.  Respiratory:  Negative for cough and shortness of breath.   Cardiovascular:  Negative for chest pain and palpitations.  Gastrointestinal:  Positive for nausea and vomiting. Negative for abdominal pain.  Endocrine: Negative for polydipsia and polyuria.  Genitourinary:  Negative for difficulty urinating and hematuria.  Musculoskeletal:  Negative for gait problem and joint swelling.  Skin:  Negative for pallor and rash.  Neurological:  Positive for speech difficulty, weakness and headaches. Negative for syncope.  Psychiatric/Behavioral:  Negative for agitation and confusion.     Physical Exam Updated Vital Signs BP (!) 168/81   Pulse 86   Temp 98.1 F (36.7 C) (Oral)   Resp (!) 29   SpO2 97%  Physical Exam Vitals and nursing note reviewed.  Constitutional:      General: She is not in acute distress.    Appearance: Normal appearance.  HENT:     Head: Normocephalic and atraumatic. No Battle's sign, right periorbital erythema or left periorbital erythema.     Jaw: There is normal jaw occlusion.     Right Ear: External ear normal.     Left Ear: External ear normal.     Nose: Nose normal.     Mouth/Throat:     Mouth: Mucous membranes are moist.  Eyes:     General: No scleral icterus.       Right eye: No discharge.        Left eye: No discharge.     Extraocular Movements: Extraocular movements intact.     Pupils: Pupils are equal, round, and reactive to light.  Cardiovascular:     Rate and Rhythm: Normal rate and regular rhythm.     Pulses: Normal pulses.     Heart sounds: Normal heart sounds.  Pulmonary:     Effort: Pulmonary effort is normal. No respiratory distress.     Breath sounds: Normal breath sounds.  Abdominal:     General: Abdomen is flat.     Palpations: Abdomen is soft.     Tenderness: There is no abdominal tenderness. There is no guarding or rebound.  Musculoskeletal:        General: Normal range of motion.     Cervical  back: Normal range of motion.     Right lower leg: No edema.     Left lower leg: No edema.  Skin:    General: Skin is warm and dry.     Capillary Refill: Capillary refill takes less than 2 seconds.  Neurological:     Mental Status: She is alert and oriented to person, place, and time.     GCS: GCS eye subscore is 4. GCS verbal subscore is 5. GCS motor subscore is 6.     Cranial Nerves: Cranial nerves 2-12 are intact.     Sensory: Sensation is intact.  Motor: Motor function is intact.     Coordination: Coordination is intact.     Comments: Difficult for her to follow commands, word finding difficulty, expressive aphasia. With rpt redirection she is able to follow most commands   Gait not tested 2/2 pt safety   Psychiatric:        Mood and Affect: Mood normal.        Behavior: Behavior normal.     ED Results / Procedures / Treatments   Labs (all labs ordered are listed, but only abnormal results are displayed) Labs Reviewed  COMPREHENSIVE METABOLIC PANEL - Abnormal; Notable for the following components:      Result Value   Sodium 133 (*)    Potassium 2.9 (*)    Chloride 96 (*)    Glucose, Bld 128 (*)    BUN 27 (*)    Calcium 8.0 (*)    All other components within normal limits  CBC WITH DIFFERENTIAL/PLATELET - Abnormal; Notable for the following components:   WBC 11.6 (*)    HCT 35.9 (*)    Neutro Abs 9.9 (*)    Lymphs Abs 0.6 (*)    All other components within normal limits  RESP PANEL BY RT-PCR (RSV, FLU A&B, COVID)  RVPGX2  LIPASE, BLOOD  PROTIME-INR  URINALYSIS, ROUTINE W REFLEX MICROSCOPIC  SODIUM  SODIUM  SODIUM    EKG EKG Interpretation  Date/Time:  Tuesday June 20 2022 08:14:26 EST Ventricular Rate:  85 PR Interval:  228 QRS Duration: 138 QT Interval:  434 QTC Calculation: 517 R Axis:   -71 Text Interpretation: Sinus rhythm Prolonged PR interval Nonspecific IVCD with LAD Left ventricular hypertrophy Anterior infarct, old similar to previous  Left bundle branch block Confirmed by Tanda Rockers ([id number]) on 06/20/2022 8:19:22 AM  Radiology CT Head Wo Contrast  Addendum Date: 06/20/2022   ADDENDUM REPORT: 06/20/2022 09:09 ADDENDUM: Findings discussed with Dr. Wallace Cullens via telephone at 9:07 a.m. Electronically Signed   By: Feliberto Harts M.D.   On: 06/20/2022 09:09   Result Date: 06/20/2022 CLINICAL DATA:  Headache, new onset (Age >= 51y) sudden onset, aphasia/weakness/n/v EXAM: CT HEAD WITHOUT CONTRAST TECHNIQUE: Contiguous axial images were obtained from the base of the skull through the vertex without intravenous contrast. RADIATION DOSE REDUCTION: This exam was performed according to the departmental dose-optimization program which includes automated exposure control, adjustment of the mA and/or kV according to patient size and/or use of iterative reconstruction technique. COMPARISON:  None Available. FINDINGS: Brain: Acute intraparenchymal hemorrhage centered in left posterior temporal/occipital region, measuring up to approximately 5.5 x 3.8 x 2.4 cm (estimated volume of 25 mL). Mild surrounding edema. Regional mass effect. No midline shift. Small volume of intraventricular extension into the atrium of the left lateral ventricle. The lateral ventricle is effaced on the left. Borderline ventriculomegaly. No evidence of acute large vascular territory infarct or visible mass lesion. Partially empty sella. Vascular: No hyperdense vessel identified. Skull: No acute fracture. Sinuses/Orbits: Clear sinuses.  No acute orbital findings. Other: No mastoid effusions. IMPRESSION: 1. Acute intraparenchymal hemorrhage centered in left posterior temporal/occipital region, measuring up to approximately 5.5 x 3.8 x 2.4 cm (estimated volume of 25 mL). Small volume of intraventricular extension into the atrium of the left lateral ventricle. 2. Borderline ventriculomegaly.  Recommend attention on follow-up. Electronically Signed: By: Feliberto Harts M.D. On:  06/20/2022 08:59    Procedures .Critical Care  Performed by: Sloan Leiter, DO Authorized by: Sloan Leiter, DO   Critical care  provider statement:    Critical care time (minutes):  78   Critical care time was exclusive of:  Separately billable procedures and treating other patients   Critical care was necessary to treat or prevent imminent or life-threatening deterioration of the following conditions:  CNS failure or compromise   Critical care was time spent personally by me on the following activities:  Development of treatment plan with patient or surrogate, discussions with consultants, evaluation of patient's response to treatment, examination of patient, ordering and review of laboratory studies, ordering and review of radiographic studies, ordering and performing treatments and interventions, pulse oximetry, re-evaluation of patient's condition, review of old charts and obtaining history from patient or surrogate   Care discussed with: admitting provider       Medications Ordered in ED Medications  clevidipine (CLEVIPREX) infusion 0.5 mg/mL (4 mg/hr Intravenous Rate/Dose Change 06/20/22 1028)  potassium chloride 10 mEq in 100 mL IVPB (10 mEq Intravenous New Bag/Given 06/20/22 1033)  sodium chloride 3% (hypertonic) IV bolus 250 mL (has no administration in time range)  ondansetron (ZOFRAN-ODT) disintegrating tablet 4 mg (4 mg Oral Given 06/20/22 0802)  sodium chloride 0.9 % bolus 1,000 mL (1,000 mLs Intravenous New Bag/Given 06/20/22 0829)    ED Course/ Medical Decision Making/ A&P Clinical Course as of 06/20/22 1034  Tue Jun 20, 2022  0856 Pt with large ICH on wet read of CT. Will start clevipex, will d/w nsgy, plan for admission. Her K is also mildly depleted, will replace over IV [SG]  0917 Paged nsgy Dr Danielle Dess, awaiting callback Clevipex started, will need 2nd iv for K+ [SG]  0918 CTH per radiology "IMPRESSION: 1. Acute intraparenchymal hemorrhage centered in left  posterior temporal/occipital region, measuring up to approximately 5.5 x 3.8 x 2.4 cm (estimated volume of 25 mL). Small volume of intraventricular extension into the atrium of the left lateral ventricle. 2. Borderline ventriculomegaly.  Recommend attention on follow-up." I agree [SG]  0928 Dr Danielle Dess, recommend admit to stroke service, send to [facility]  [SG]  1029 Spoke with Dr Selina Cooley neurology, recommend send to [facility] ICU w/ neuro as primary, cont clevipex, give hypertonic saline.  [SG]    Clinical Course User Index [SG] Sloan Leiter, DO                           Medical Decision Making Amount and/or Complexity of Data Reviewed Labs: ordered. Radiology: ordered.  Risk Prescription drug management. Decision regarding hospitalization.   This patient presents to the ED with chief complaint(s) of headache, aphasia, n/v, weakness with pertinent past medical history of htn, as above which further complicates the presenting complaint. The complaint involves an extensive differential diagnosis and also carries with it a high risk of complications and morbidity.    Differential diagnosis includes but is not exclusive to subarachnoid hemorrhage, meningitis, encephalitis, previous head trauma, cavernous venous thrombosis, muscle tension headache, glaucoma, temporal arteritis, migraine or migraine equivalent, intra abdominal pathology also considered including gastritis, enteritis, etc.  . Serious etiologies were considered.   The initial plan is to screening labs, get [facility]   Additional history obtained: Additional history obtained from spouse Records reviewed Primary Care Documents and prior labs/imaging/home meds   Independent labs interpretation:  The following labs were independently interpreted:  Cmp with mild low Na at 133, K is low at 2.9, start ivf and K+ replacement via iv CBC with mild leukocytosis, no fever, low susp for sepsis INR is  wnl  Independent visualization of imaging: - I  independently visualized the following imaging with scope of interpretation limited to determining acute life threatening conditions related to emergency care: CTH, which revealed large IPH  Cardiac monitoring was reviewed and interpreted by myself which shows NSR  Treatment and Reassessment: Clevipex 3% NaCl >> stayed the same  Consultation: - Consulted or discussed management/test interpretation w/ external professional:  Dr Danielle Dess NSGY, no acute intervention, [facility], Dr Derry Lory will be accepting Dr at [facility] for admission  Dr Selina Cooley neuro, send to [facility], start hypertonic saline, contine clevipex Dr Rush Landmark at [facility] ED, accepts ED to ED transfer, emergency traffic   Consideration for admission or further workup: Admission was considered   Pt with large IPH, LKN was Sunday morning, no thinners. Neuro exam c/w aphasia but o/w stable. Plan for admission, send ED to ED to expedite transfer/evaluation given critically ill patient w/ high risk for decompensation.   Social Determinants of health: Social History   Tobacco Use   Smoking status: Never   Smokeless tobacco: Never  Vaping Use   Vaping Use: Never used  Substance Use Topics   Alcohol use: No   Drug use: No            Final Clinical Impression(s) / ED Diagnoses Final diagnoses:  Intraparenchymal hemorrhage of brain [facility])    Rx / DC Orders ED Discharge Orders     None         Sloan Leiter, DO 06/20/22 1034

## 2022-06-21 DIAGNOSIS — I619 Nontraumatic intracerebral hemorrhage, unspecified: Secondary | ICD-10-CM | POA: Diagnosis not present

## 2022-06-21 LAB — URINALYSIS, ROUTINE W REFLEX MICROSCOPIC
Bacteria, UA: NONE SEEN
Bilirubin Urine: NEGATIVE
Glucose, UA: NEGATIVE mg/dL
Hgb urine dipstick: NEGATIVE
Ketones, ur: 20 mg/dL — AB
Nitrite: NEGATIVE
Protein, ur: NEGATIVE mg/dL
Specific Gravity, Urine: 1.017 (ref 1.005–1.030)
pH: 6 (ref 5.0–8.0)

## 2022-06-21 LAB — BASIC METABOLIC PANEL
Anion gap: 9 (ref 5–15)
BUN: 14 mg/dL (ref 8–23)
CO2: 22 mmol/L (ref 22–32)
Calcium: 6.9 mg/dL — ABNORMAL LOW (ref 8.9–10.3)
Chloride: 114 mmol/L — ABNORMAL HIGH (ref 98–111)
Creatinine, Ser: 0.57 mg/dL (ref 0.44–1.00)
GFR, Estimated: >60 mL/min (ref >60)
Glucose, Bld: 80 mg/dL (ref 70–99)
Potassium: 2.9 mmol/L — ABNORMAL LOW (ref 3.5–5.1)
Sodium: 145 mmol/L (ref 135–145)

## 2022-06-21 LAB — SODIUM
Sodium: 146 mmol/L — ABNORMAL HIGH (ref 135–145)
Sodium: 142 mmol/L (ref 135–145)

## 2022-06-21 LAB — MAGNESIUM: Magnesium: 2.1 mg/dL (ref 1.7–2.4)

## 2022-06-21 LAB — TRIGLYCERIDES: Triglycerides: 179 mg/dL — ABNORMAL HIGH (ref <150)

## 2022-06-21 MED ORDER — POTASSIUM CHLORIDE CRYS ER 20 MEQ PO TBCR
40.0000 meq | EXTENDED_RELEASE_TABLET | Freq: Once | ORAL | Status: AC
  Administered 2022-06-21: 40 meq via ORAL
  Filled 2022-06-21: qty 2

## 2022-06-21 MED ORDER — METOPROLOL SUCCINATE ER 50 MG PO TB24
50.0000 mg | ORAL_TABLET | Freq: Every day | ORAL | Status: DC
  Administered 2022-06-21 – 2022-06-23 (×3): 50 mg via ORAL
  Filled 2022-06-21 (×3): qty 1

## 2022-06-21 MED ORDER — POTASSIUM CHLORIDE 10 MEQ/100ML IV SOLN
10.0000 meq | INTRAVENOUS | Status: AC
  Administered 2022-06-21 (×4): 10 meq via INTRAVENOUS
  Filled 2022-06-21 (×4): qty 100

## 2022-06-21 MED ORDER — LOSARTAN POTASSIUM 50 MG PO TABS
100.0000 mg | ORAL_TABLET | Freq: Every day | ORAL | Status: DC
  Administered 2022-06-21 – 2022-06-26 (×6): 100 mg via ORAL
  Filled 2022-06-21 (×7): qty 2

## 2022-06-21 MED ORDER — ONDANSETRON HCL 4 MG/2ML IJ SOLN
4.0000 mg | Freq: Four times a day (QID) | INTRAMUSCULAR | Status: DC | PRN
  Administered 2022-06-21: 4 mg via INTRAVENOUS
  Filled 2022-06-21: qty 2

## 2022-06-21 MED ORDER — SODIUM CHLORIDE 3 % IV BOLUS
100.0000 mL | Freq: Once | INTRAVENOUS | Status: AC
  Administered 2022-06-21: 100 mL via INTRAVENOUS
  Filled 2022-06-21: qty 500

## 2022-06-21 NOTE — Progress Notes (Addendum)
STROKE TEAM PROGRESS NOTE   INTERVAL HISTORY She presented with sudden onset of headache, nausea, vomiting and word finding difficulties and CT scan shows a large left temporal parenchymal hemorrhage with volume of 25 cubic cc.   Overnight she has remained stable without neurological worsening.  MRI shows left temporal parenchymal hemorrhage with mild cytotoxic edema with no significant midline shift or underlying tumor or vascular abnormality.   No family at the bedside. Reports mild headache this morning. BP goal 130-150, still requiring low dose cleviprex. Will reorder home medications.  Some word finding difficulty, but following all commands. SLP consulted as well.  Imaging:  Repeat MRI in 2-3 months Head CT tomorrow morning  K replaced  She is on hypertonic saline and serum sodium is 146  Vitals:   06/21/22 0800 06/21/22 0850 06/21/22 0900 06/21/22 0930  BP: (!) 143/62 (!) 152/76 (!) 162/87 (!) 164/76  Pulse: 80 (!) 104 81 93  Resp: 14 (!) 24 16 (!) 24  Temp: 98.9 F (37.2 C)     TempSrc: Oral     SpO2: 96% 95% 96% 96%  Weight:      Height:       CBC:  Recent Labs  Lab 06/20/22 0737  WBC 11.6*  NEUTROABS 9.9*  HGB 12.0  HCT 35.9*  MCV 90.2  PLT [id number]   Basic Metabolic Panel:  Recent Labs  Lab 06/20/22 0736 06/20/22 1037 06/20/22 2200 06/21/22 0421  NA 133*   < > 141 145  K 2.9*  --   --  2.9*  CL 96*  --   --  114*  CO2 24  --   --  22  GLUCOSE 128*  --   --  80  BUN 27*  --   --  14  CREATININE 0.60  --   --  0.57  CALCIUM 8.0*  --   --  6.9*  MG  --   --   --  2.1   < > = values in this interval not displayed.   Lipid Panel: No results for input(s): "CHOL", "TRIG", "HDL", "CHOLHDL", "VLDL", "LDLCALC" in the last 168 hours. HgbA1c: No results for input(s): "HGBA1C" in the last 168 hours. Urine Drug Screen: No results for input(s): "LABOPIA", "COCAINSCRNUR", "LABBENZ", "AMPHETMU", "THCU", "LABBARB" in the last 168 hours.  Alcohol Level No results for  input(s): "ETH" in the last 168 hours.  IMAGING past 24 hours MR BRAIN W WO CONTRAST  Result Date: 06/20/2022 CLINICAL DATA:  Hemorrhagic stroke EXAM: MRI HEAD WITHOUT AND WITH CONTRAST TECHNIQUE: Multiplanar, multiecho pulse sequences of the brain and surrounding structures were obtained without and with intravenous contrast. CONTRAST:  52mL GADAVIST GADOBUTROL 1 MMOL/ML IV SOLN COMPARISON:  No prior MRI, correlation is made with CT head 06/20/2022 FINDINGS: Brain: Redemonstrated hematoma centered in the left posterior temporal and lateral occipital region, which measures approximately min 6.3 x 3.3 x 3.0 cm (AP x TR x CC) (series 14, image 13 and series 21, image 12), with an approximate volume of 31 mL, likely unchanged compared to the prior exam when accounting for differences in technique. The hemorrhage is T1 isointense and hyperintense and primarily T2 hypointense, consistent with acute and early subacute blood products. No abnormal enhancement within the hematoma to suggest active extravasation. Mild surrounding T2 hyperintense signal, consistent with edema, with mass effect on the left lateral ventricle atrium, temporal horn, and occipital horn, which demonstrate a small amount of intraventricular extension. A small amount of hemorrhage is  also noted in the right occipital horn. No abnormal parenchymal or meningeal enhancement. No additional acute infarct, mass, or midline shift. The basal cisterns are patent. Partial empty sella. Normal craniocervical junction. Vascular: Normal arterial flow voids. Normal arterial and venous enhancement. Skull and upper cervical spine: Normal marrow signal. Sinuses/Orbits: Minimal mucosal thickening in the paranasal sinuses. Status post bilateral lens replacements. Other: The mastoids are well aerated. IMPRESSION: 1. Redemonstrated hematoma centered in the left posterior temporal and lateral occipital region, with an approximate volume of 31 mL, likely unchanged  compared to the prior CT when accounting for differences in technique. No abnormal enhancement to suggest active extravasation. 2. Mild surrounding edema with mass effect on the left lateral ventricle atrium, temporal horn, and occipital horn, with a small amount of left greater than right intraventricular extension. Electronically Signed   By: Merilyn Baba M.D.   On: 06/20/2022 19:57   ECHOCARDIOGRAM COMPLETE  Result Date: 06/20/2022    ECHOCARDIOGRAM REPORT   Patient Name:   KIELYNN GOIN Date of Exam: 06/20/2022 Medical Rec #:  [id number]       Height:       62.0 in Accession #:    [id number]      Weight:       122.0 lb Date of Birth:  03-Aug-1945       BSA:          1.549 m Patient Age:    76 years        BP:           160/92 mmHg Patient Gender: F               HR:           88 bpm. Exam Location:  Inpatient Procedure: 2D Echo, 3D Echo, Cardiac Doppler and Color Doppler Indications:    Stroke. ICH  History:        Patient has no prior history of Echocardiogram examinations.                 Abnormal ECG, Arrythmias:LBBB; Risk Factors:Hypertension and                 Sleep Apnea.  Sonographer:    Roseanna Rainbow RDCS Referring Phys: [id number] Melvin  1. Left ventricular ejection fraction, by estimation, is 65 to 70%. Left ventricular ejection fraction by 3D volume is 70 %. The left ventricle has normal function. The left ventricle has no regional wall motion abnormalities. There is mild concentric left ventricular hypertrophy. Indeterminate diastolic filling due to E-A fusion.  2. Right ventricular systolic function is normal. The right ventricular size is normal. There is mildly elevated pulmonary artery systolic pressure. The estimated right ventricular systolic pressure is [id number] mmHg.  3. The mitral valve is grossly normal. Trivial mitral valve regurgitation. No evidence of mitral stenosis.  4. The aortic valve is tricuspid. Aortic valve regurgitation is not visualized. No aortic stenosis  is present.  5. The inferior vena cava is normal in size with <50% respiratory variability, suggesting right atrial pressure of 8 mmHg. Conclusion(s)/Recommendation(s): No intracardiac source of embolism detected on this transthoracic study. Consider a transesophageal echocardiogram to exclude cardiac source of embolism if clinically indicated. FINDINGS  Left Ventricle: Left ventricular ejection fraction, by estimation, is 65 to 70%. Left ventricular ejection fraction by 3D volume is 70 %. The left ventricle has normal function. The left ventricle has no regional wall motion abnormalities. The left ventricular internal cavity size was  normal in size. There is mild concentric left ventricular hypertrophy. Indeterminate diastolic filling due to E-A fusion. Right Ventricle: The right ventricular size is normal. No increase in right ventricular wall thickness. Right ventricular systolic function is normal. There is mildly elevated pulmonary artery systolic pressure. The tricuspid regurgitant velocity is 2.86  m/s, and with an assumed right atrial pressure of 8 mmHg, the estimated right ventricular systolic pressure is [id number] mmHg. Left Atrium: Left atrial size was normal in size. Right Atrium: Right atrial size was normal in size. Pericardium: There is no evidence of pericardial effusion. Presence of epicardial fat layer. Mitral Valve: The mitral valve is grossly normal. Mild mitral annular calcification. Trivial mitral valve regurgitation. No evidence of mitral valve stenosis. MV peak gradient, 11.4 mmHg. The mean mitral valve gradient is 5.0 mmHg. Tricuspid Valve: The tricuspid valve is grossly normal. Tricuspid valve regurgitation is mild . No evidence of tricuspid stenosis. Aortic Valve: The aortic valve is tricuspid. Aortic valve regurgitation is not visualized. No aortic stenosis is present. Aortic valve mean gradient measures 7.0 mmHg. Aortic valve peak gradient measures 13.2 mmHg. Aortic valve area, by VTI  measures 2.80  cm. Pulmonic Valve: The pulmonic valve was grossly normal. Pulmonic valve regurgitation is not visualized. No evidence of pulmonic stenosis. Aorta: The aortic root and ascending aorta are structurally normal, with no evidence of dilitation. Venous: The right lower pulmonary vein is normal. The inferior vena cava is normal in size with less than 50% respiratory variability, suggesting right atrial pressure of 8 mmHg. IAS/Shunts: The interatrial septum is aneurysmal. The atrial septum is grossly normal.  LEFT VENTRICLE PLAX 2D LVIDd:         3.50 cm         Diastology LVIDs:         2.00 cm         LV e' medial:   2.94 cm/s LV PW:         1.30 cm         LV E/e' medial: 40.8 LV IVS:        1.30 cm LVOT diam:     2.30 cm LV SV:         95              3D Volume EF LV SV Index:   61              LV 3D EF:    Left LVOT Area:     4.15 cm                     ventricul                                             ar                                             ejection LV Volumes (MOD)                            fraction LV vol d, MOD    78.9 ml  by 3D A2C:                                        volume is LV vol d, MOD    63.0 ml                    70 %. A4C: LV vol s, MOD    17.6 ml A2C:                           3D Volume EF: LV vol s, MOD    18.2 ml       3D EF:        70 % A4C:                           LV EDV:       117 ml LV SV MOD A2C:   61.3 ml       LV ESV:       34 ml LV SV MOD A4C:   63.0 ml       LV SV:        82 ml LV SV MOD BP:    52.9 ml RIGHT VENTRICLE             IVC RV S prime:     16.20 cm/s  IVC diam: 1.90 cm TAPSE (M-mode): 2.7 cm LEFT ATRIUM             Index        RIGHT ATRIUM           Index LA diam:        3.50 cm 2.26 cm/m   RA Area:     12.50 cm LA Vol (A2C):   40.7 ml 26.27 ml/m  RA Volume:   26.50 ml  17.10 ml/m LA Vol (A4C):   45.2 ml 29.17 ml/m LA Biplane Vol: 47.0 ml 30.33 ml/m  AORTIC VALVE AV Area (Vmax):    2.88 cm AV Area (Vmean):   2.84 cm AV  Area (VTI):     2.80 cm AV Vmax:           182.00 cm/s AV Vmean:          126.000 cm/s AV VTI:            0.340 m AV Peak Grad:      13.2 mmHg AV Mean Grad:      7.0 mmHg LVOT Vmax:         126.00 cm/s LVOT Vmean:        86.200 cm/s LVOT VTI:          0.229 m LVOT/AV VTI ratio: 0.67  AORTA Ao Root diam: 2.80 cm Ao Asc diam:  3.40 cm MITRAL VALVE                TRICUSPID VALVE MV Area (PHT): 4.15 cm     TR Peak grad:   32.7 mmHg MV Area VTI:   2.75 cm     TR Vmax:        286.00 cm/s MV Peak grad:  11.4 mmHg MV Mean grad:  5.0 mmHg     SHUNTS MV Vmax:       1.69 m/s     Systemic VTI:  0.23 m MV Vmean:  107.0 cm/s   Systemic Diam: 2.30 cm MV Decel Time: 183 msec MV E velocity: 120.00 cm/s MV A velocity: 154.00 cm/s MV E/A ratio:  0.78 Eleonore Chiquito MD Electronically signed by Eleonore Chiquito MD Signature Date/Time: 06/20/2022/3:42:10 PM    Final    CT ANGIO HEAD NECK W WO CM  Result Date: 06/20/2022 CLINICAL DATA:  Hemorrhagic stroke EXAM: CT ANGIOGRAPHY HEAD AND NECK TECHNIQUE: Multidetector CT imaging of the head and neck was performed using the standard protocol during bolus administration of intravenous contrast. Multiplanar CT image reconstructions and MIPs were obtained to evaluate the vascular anatomy. Carotid stenosis measurements (when applicable) are obtained utilizing NASCET criteria, using the distal internal carotid diameter as the denominator. RADIATION DOSE REDUCTION: This exam was performed according to the departmental dose-optimization program which includes automated exposure control, adjustment of the mA and/or kV according to patient size and/or use of iterative reconstruction technique. CONTRAST:  70mL OMNIPAQUE IOHEXOL 350 MG/ML SOLN COMPARISON:  Same-day noncontrast CT head FINDINGS: CTA NECK FINDINGS Aortic arch: The imaged aortic arch is normal. The origins of the major branch vessels are patent. The subclavian arteries are patent to the level imaged. Right carotid system: The right  common, internal, and external carotid arteries are patent with mild plaque at the bifurcation but no hemodynamically significant stenosis or occlusion. There is no dissection or aneurysm. Left carotid system: The left common, internal, and external carotid arteries are patent, with minimal plaque at the bifurcation but no hemodynamically significant stenosis or occlusion. There is no dissection or aneurysm. Vertebral arteries: The vertebral arteries are patent, without hemodynamically significant stenosis or occlusion. There is no dissection or aneurysm. Skeleton: There is mild degenerative change of the cervical spine with disc space narrowing and degenerative endplate change [id number] and mild grade 1 anterolisthesis of C5 on C6. There is no acute osseous abnormality or suspicious osseous lesion. There is no visible canal hematoma. Other neck: Soft tissues of the neck are unremarkable. Upper chest: The imaged lung apices are clear. Review of the MIP images confirms the above findings CTA HEAD FINDINGS Anterior circulation: The intracranial ICAs are patent with minimal plaque. The bilateral MCAs are patent, without proximal stenosis or occlusion. The bilateral ACAs are patent, without proximal stenosis or occlusion. The anterior communicating artery is not definitely seen. There is no aneurysm or AVM. There is no spot sign to suggest active extravasation into the left temporal lobe hematoma. The hematoma appears grossly similar in size with similar regional mass effect. Posterior circulation: The bilateral V4 segments are patent. The basilar artery is patent. The major cerebellar arteries are patent. The bilateral PCAs are patent, without proximal stenosis or occlusion. There is no aneurysm or AVM. Venous sinuses: Patent. Anatomic variants: None. Review of the MIP images confirms the above findings IMPRESSION: 1. Normal vasculature of the head and neck with no aneurysm or AVM. 2. No evidence of active extravasation  into the left temporal lobe intraparenchymal hematoma. Electronically Signed   By: Valetta Mole M.D.   On: 06/20/2022 13:38   [place] EKG SITE RITE  Result Date: 06/20/2022 If Site Rite image not attached, placement could not be confirmed due to current cardiac rhythm.   PHYSICAL EXAM  Physical Exam  Constitutional: Appears well-developed and well-nourished.  Pleasant elderly Caucasian lady Cardiovascular: Normal rate and regular rhythm.  Respiratory: Effort normal, non-labored breathing  Neuro: Mental Status: Patient is awake, alert, oriented to person, place, month, year, and situation. Patient is able to give a clear  and coherent history. Still having word finding difficulties and difficulty identifying objects.  Mild expressive failure.  Good comprehension but impaired naming and repetition Cranial Nerves: II: Right peripheral field cut. Pupils are equal, round, and reactive to light.   III,IV, VI: EOMI without ptosis or diploplia.  V: Facial sensation is symmetric to temperature VII: Facial movement is symmetric resting and smiling VIII: Hearing is intact to voice X: Palate elevates symmetrically XI: Shoulder shrug is symmetric. XII: Tongue protrudes midline without atrophy or fasciculations.  Motor: Tone is normal. Bulk is normal. 5/5 strength was present in all four extremities.  Sensory: Sensation is symmetric to light touch and temperature in the arms and legs.  Cerebellar: FNF and HKS are intact bilaterally        ASSESSMENT/PLAN Ms. Deborah Kerr is a 76 y.o. female with history of HTN, thyroid disease, arthritis, LBBB who states she was in her normal state, out shopping with husband on Sunday 12/17 and had sudden onset of headache, N/V. MRI shows a left posterior temporal and lateral occipital region hematoma. 3% saline infusing with Na 146, K 2.9.   Strok:  Left posterior temporal/occipital parenchymal hemorrhage with IVH extension  Etiology:  uncontrolled  HTN Code Stroke CT head - Acute intraparenchymal hemorrhage centered in left posterior temporal/occipital region, measuring up to approximately 5.5 x 3.8 x 2.4 cm (estimated volume of 25 mL). Small volume of intraventricular extension into the atrium of the left lateral ventricle. CTA head & neck- Normal vasculature of the head and neck with no aneurysm or AVM. No evidence of active extravasation into the left temporal lobe intraparenchymal hematoma. MRI  Redemonstrated hematoma centered in the left posterior temporal and lateral occipital region, with an approximate volume of 31 mL, likely unchanged compared to the prior CT when accounting for differences in technique. No abnormal enhancement to suggest active extravasation. Mild surrounding edema with mass effect on the left lateral ventricle atrium, temporal horn, and occipital horn, with a small amount of left greater than right intraventricular extension. 2D Echo EF 65-70% LDL No results found for requested labs within last 1095 days. HgbA1c No results found for requested labs within last 1095 days. VTE prophylaxis - SCDs    Diet   Diet full liquid Room service appropriate? Yes with Assist; Fluid consistency: Thin   No antithrombotic prior to admission, now on No antithrombotic due to ICH Therapy recommendations:  Outpatient Disposition:  pending   Cerebral Edema with brain compression 3% saline- 65ml/hr Na- 145->146 Head CT tomorrow morning   Hypertension Home meds:  metoprolol succinate, losartan  Stable BP goal 130-150 Cleviprex IV  Other Stroke Risk Factors Advanced Age >/= 49  Obesity, Body mass index is 23.86 kg/m., BMI >/= 30 associated with increased stroke risk, recommend weight loss, diet and exercise as appropriate   Other Active Problems Arthritis Home meds- leflunomide  Hypokalemia  K 2.9 - ordered  Hospital day # 1  Patient seen and examined by NP/APP with MD. MD to update note as needed.   Elmer Picker, DNP, FNP-BC [facility] Pager: [phone]  STROKE MD NOTE :  I have personally obtained history,examined this patient, reviewed notes, independently viewed imaging studies, participated in medical decision making and plan of care.ROS completed by me personally and pertinent positives fully documented  I have made any additions or clarifications directly to the above note. Agree with note above.  Patient presented with mild aphasia and headache due to large left temporal parenchymal  hemorrhage of indeterminate etiology likely related to uncontrolled hypertension.  CT scan and MRI showed no underlying structural lesions.  She remains at risk for hematoma expansion, worsening of cytotoxic edema development of hydrocephalus needs close neurological monitoring.  Strict control of blood pressure with systolic goal [id number] for the first 24 hours and then below 160.  Continue hypertonic saline with serum sodium goal 150-155.  Mobilize out of bed.  Therapy consults.  No family available at the bedside.  Repeat CT head tomorrow morning. This patient is critically ill and at significant risk of neurological worsening, death and care requires constant monitoring of vital signs, hemodynamics,respiratory and cardiac monitoring, extensive review of multiple databases, frequent neurological assessment, discussion with family, other specialists and medical decision making of high complexity.I have made any additions or clarifications directly to the above note.This critical care time does not reflect procedure time, or teaching time or supervisory time of PA/NP/Med Resident etc but could involve care discussion time.  I spent 35 minutes of neurocritical care time  in the care of  this patient.      Antony Contras, MD Medical Director [facility] Pager: [phone] 06/21/2022 3:19 PM   To contact Stroke Continuity provider, please refer to [email or web address] After hours, contact General  Neurology

## 2022-06-21 NOTE — Evaluation (Signed)
Speech Language Pathology Evaluation Patient Details Name: Deborah Kerr MRN: [id number] DOB: April 22, 1946 Today's Date: 06/21/2022 Time:  -     Problem List:  Patient Active Problem List   Diagnosis Date Noted   Intraparenchymal hemorrhage of brain (HCC) 06/20/2022   ICH (intracerebral hemorrhage) (HCC) 06/20/2022   Spinal stenosis at L4-L5 level 07/21/2021   Spinal stenosis of lumbar region 02/17/2021   Closed trimalleolar fracture of left ankle 06/22/2020   Special screening for malignant neoplasms, colon    Diverticulosis of colon without hemorrhage    Past Medical History:  Past Medical History:  Diagnosis Date   Arthritis    rheumatoid arthritis   Hypertension    Hypothyroidism 2009   Left bundle branch block    Thyroid disease    Past Surgical History:  Past Surgical History:  Procedure Laterality Date   APPENDECTOMY     per patient 1970s   COLONOSCOPY     COLONOSCOPY N/A 05/18/2015   Procedure: COLONOSCOPY;  Surgeon: Corbin Ade, MD;  Location: [facility];  Service: Endoscopy;  Laterality: N/A;  11:30   EYE SURGERY Bilateral 2021   cataract surgery   HARDWARE REMOVAL Left 12/22/2020   Procedure: HARDWARE REMOVAL LEFT ANKLE;  Surgeon: Roby Lofts, MD;  Location: [facility];  Service: Orthopedics;  Laterality: Left;   LUMBAR LAMINECTOMY/DECOMPRESSION MICRODISCECTOMY N/A 07/21/2021   Procedure: Microlumbar decompression Lumbar Four-Five and Lumbar Five - Sacral One, excision of synovial cyst;  Surgeon: Jene Every, MD;  Location: [facility] OR;  Service: Orthopedics;  Laterality: N/A;   OOPHORECTOMY     ORIF ANKLE FRACTURE Left 06/24/2020   Procedure: OPEN REDUCTION INTERNAL FIXATION (ORIF) ANKLE FRACTURE;  Surgeon: Roby Lofts, MD;  Location: [facility];  Service: Orthopedics;  Laterality: Left;   Thyroidectomy  2006   TUBAL LIGATION     HPI:  Deborah Kerr is an 76 y.o. female with HTN, thyroid disease, arthritis, LBBB who states she was in her normal state,  out shopping with husband on Sunday 12/17 and had sudden onset of headache, N/V. She has felt gen unwell since, unable to tolerate much activity or food. She became gen weak and husband noted word finding difficulty which prompted them to finally seek medical attention at [facility]. No falls or trauma reported. 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 Writing in tact. Cues for sentence completion, rhyming, articulatory cues and initial sound cue all unsuccessful to aid in word retrieval. Pt needed a model to repeat. Though auditory comprehension is intact, visual word recognition/reading is moderately impaired. Pt cannot read a written command aloud or comprehend the written command, but when given the same command verbally pt can complete it. Pt will need f/u at OP SLP.    SLP Assessment  SLP Recommendation/Assessment: Patient needs continued Speech Lanaguage Pathology Services    Recommendations for follow up therapy are one component of a multi-disciplinary discharge planning process, led by the attending physician.  Recommendations may be updated based on patient status, additional functional criteria and insurance authorization.    Follow Up Recommendations  Outpatient SLP    Assistance Recommended at Discharge  Intermittent Supervision/Assistance  Functional Status Assessment Patient has had a recent decline in their functional status and demonstrates the ability to make significant improvements in function in a reasonable and predictable amount of time.  Frequency and  Duration min 1 x/week  2 weeks      SLP Evaluation Cognition  Overall Cognitive Status: Within Functional Limits for tasks assessed Arousal/Alertness: Awake/alert Orientation Level: Oriented X4;Oriented to person;Oriented to place;Oriented to time Attention: Alternating Alternating  Attention: Appears intact Memory: Appears intact Awareness: Appears intact Problem Solving: Appears intact       Comprehension  Auditory Comprehension Overall Auditory Comprehension: Appears within functional limits for tasks assessed Visual Recognition/Discrimination Discrimination: Within Function Limits Reading Comprehension Reading Status: Impaired Word level: Impaired    Expression Verbal Expression Overall Verbal Expression: Impaired Initiation: No impairment Automatic Speech: Name;Social Response Level of Generative/Spontaneous Verbalization: Conversation Repetition: No impairment Naming: Impairment Responsive: 0-25% accurate Confrontation: Impaired Convergent: 0-24% accurate Pragmatics: No impairment Written Expression Dominant Hand: Right Written Expression: Exceptions to [facility] Dictation Ability: Sentence Self Formulation Ability: Word Interfering Components: Other (comment) (baseline spelling ability)   Oral / Motor  Motor Speech Overall Motor Speech: Appears within functional limits for tasks assessed Respiration: Within functional limits Phonation: Normal            Noam Karaffa, Riley Nearing 06/21/2022, 11:49 AM

## 2022-06-21 NOTE — Evaluation (Signed)
Physical Therapy Evaluation Patient Details Name: Deborah Kerr MRN: [id number] DOB: 1945/12/03 Today's Date: 06/21/2022  History of Present Illness  76 y.o. female presents to [facility] hospital on 06/20/2022 with sudden onset HA, nausea and vomiting. 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 PMH includes OA, HTN, thyroid disease.  Clinical Impression  Pt presents to PT with deficits in functional mobility, gait, balance, endurance. Pt with lateral drift when without UE support during session, dependent on UE support to maintain balance at this time. Pt will benefit from assessment of gait with rollator vs cane next session as these are the devices the pt currently owns. PT recommends outpatient PT for continued balance progression at the time of discharge.       Recommendations for follow up therapy are one component of a multi-disciplinary discharge planning process, led by the attending physician.  Recommendations may be updated based on patient status, additional functional criteria and insurance authorization.  Follow Up Recommendations Outpatient PT      Assistance Recommended at Discharge Intermittent Supervision/Assistance  Patient can return home with the following  A little help with walking and/or transfers;A little help with bathing/dressing/bathroom;Assistance with cooking/housework;Assist for transportation;Help with stairs or ramp for entrance;Direct supervision/assist for medications management;Direct supervision/assist for financial management    Equipment Recommendations None recommended by PT (owns necessary DME)  Recommendations for Other Services       Functional Status Assessment Patient has had a recent decline in their functional status and demonstrates the ability to make significant improvements in function in a reasonable and predictable amount of time.     Precautions / Restrictions Precautions Precautions: Fall Precaution Comments: SBP<150 Restrictions Weight  Bearing Restrictions: No      Mobility  Bed Mobility Overal bed mobility: Modified Independent                  Transfers Overall transfer level: Needs assistance Equipment used: None Transfers: Sit to/from Stand Sit to Stand: Supervision           General transfer comment: pt with lateral drift initially in standing, requires UE support to steady    Ambulation/Gait Ambulation/Gait assistance: Min guard, Min assist Gait Distance (Feet): 250 Feet Assistive device: IV Pole, None Gait Pattern/deviations: Step-through pattern, Staggering right, Staggering left, Drifts right/left Gait velocity: reduced Gait velocity interpretation: <1.8 ft/sec, indicate of risk for recurrent falls   General Gait Details: pt requires UE support to steady, otherwise with multiple incidents of lateral drift and losses of balance  Stairs            Wheelchair Mobility    Modified Rankin (Stroke Patients Only)       Balance Overall balance assessment: Needs assistance Sitting-balance support: No upper extremity supported, Feet supported Sitting balance-Leahy Scale: Good     Standing balance support: Single extremity supported, Reliant on assistive device for balance Standing balance-Leahy Scale: Poor                               Pertinent Vitals/Pain Pain Assessment Pain Assessment: Faces Faces Pain Scale: Hurts even more Pain Location: head Pain Descriptors / Indicators: Headache Pain Intervention(s): Monitored during session    Home Living Family/patient expects to be discharged to:: Private residence Living Arrangements: Spouse/significant other;Children Available Help at Discharge: Family;Available 24 hours/day Type of Home: House Home Access: Stairs to enter Entrance Stairs-Rails: Right Entrance Stairs-Number of Steps: 3   Home Layout: One level  Home Equipment: Rollator (4 wheels);Cane - single point      Prior Function Prior Level of  Function : Independent/Modified Independent;Driving                     Hand Dominance   Dominant Hand: Right    Extremity/Trunk Assessment   Upper Extremity Assessment Upper Extremity Assessment: Overall WFL for tasks assessed    Lower Extremity Assessment Lower Extremity Assessment: Overall WFL for tasks assessed    Cervical / Trunk Assessment Cervical / Trunk Assessment: Normal  Communication   Communication: No difficulties  Cognition Arousal/Alertness: Awake/alert Behavior During Therapy: WFL for tasks assessed/performed Overall Cognitive Status: Impaired/Different from baseline Area of Impairment: Memory                     Memory: Decreased short-term memory                  General Comments General comments (skin integrity, edema, etc.): VSS on RA, BP 155/68 post-mobility, RN made aware    Exercises     Assessment/Plan    PT Assessment Patient needs continued PT services  PT Problem List Decreased balance;Decreased activity tolerance;Decreased mobility;Pain       PT Treatment Interventions DME instruction;Gait training;Functional mobility training;Stair training;Balance training;Neuromuscular re-education;Patient/family education;Therapeutic activities    PT Goals (Current goals can be found in the Care Plan section)  Acute Rehab PT Goals Patient Stated Goal: to return to independence, improve balance PT Goal Formulation: With patient Time For Goal Achievement: 07/05/22 Potential to Achieve Goals: Good Additional Goals Additional Goal #1: Pt will score >>19/24 on the DGI to indicate a reduced risk for falls Additional Goal #2: Pt will score >>45/56 on the BERG to indicate a reduced risk for falls    Frequency Min 3X/week     Co-evaluation               AM-PAC PT "6 Clicks" Mobility  Outcome Measure Help needed turning from your back to your side while in a flat bed without using bedrails?: None Help needed moving from  lying on your back to sitting on the side of a flat bed without using bedrails?: None Help needed moving to and from a bed to a chair (including a wheelchair)?: A Little Help needed standing up from a chair using your arms (e.g., wheelchair or bedside chair)?: A Little Help needed to walk in hospital room?: A Little Help needed climbing 3-5 steps with a railing? : A Little 6 Click Score: 20    End of Session   Activity Tolerance: Patient tolerated treatment well Patient left: in bed;with call bell/phone within reach;with bed alarm set Nurse Communication: Mobility status PT Visit Diagnosis: Other abnormalities of gait and mobility (R26.89)    Time: 2458-0998 PT Time Calculation (min) (ACUTE ONLY): 22 min   Charges:   PT Evaluation $PT Eval Low Complexity: 1 Low          Arlyss Gandy, PT, DPT Acute Rehabilitation Office [phone]   Arlyss Gandy 06/21/2022, 11:17 AM

## 2022-06-21 NOTE — Progress Notes (Signed)
OT Cancellation Note  Patient Details Name: Deborah Kerr MRN: [id number] DOB: May 16, 1946   Cancelled Treatment:    Reason Eval/Treat Not Completed: Active bedrest order OT order received and appreciated however this conflicts with current bedrest order set. Please increase activity tolerance as appropriate and remove bedrest from orders. . Please contact OT at [phone] if bed rest order is discontinued. OT will hold evaluation at this time and will check back as time allows pending increased activity orders.   Mateo Flow 06/21/2022, 8:46 AM

## 2022-06-21 NOTE — Evaluation (Signed)
Occupational Therapy Evaluation Patient Details Name: Deborah Kerr MRN: [id number] DOB: Jan 08, 1946 Today's Date: 06/21/2022   History of Present Illness 76 y.o. female presents to [facility] hospital on 06/20/2022 with sudden onset HA, nausea and vomiting. 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 PMH includes OA, HTN, thyroid disease.   Clinical Impression   PT admitted with ICH with IVH. Pt currently with functional limitiations due to the deficits listed below (see OT problem list). Pt currently with medications to help manage BP and monitored closely during session. Pt symptomatic with HA during movement and resolves with reposition. Pt requires single UE support with transfers for safety so could benefit from DME at home. Spouse present and very supportive.  Pt will benefit from skilled OT to increase their independence and safety with adls and balance to allow discharge outpatient .       Recommendations for follow up therapy are one component of a multi-disciplinary discharge planning process, led by the attending physician.  Recommendations may be updated based on patient status, additional functional criteria and insurance authorization.   Follow Up Recommendations  Outpatient OT     Assistance Recommended at Discharge Set up Supervision/Assistance  Patient can return home with the following A little help with walking and/or transfers;Assistance with cooking/housework;Direct supervision/assist for medications management;Assist for transportation    Functional Status Assessment  Patient has had a recent decline in their functional status and demonstrates the ability to make significant improvements in function in a reasonable and predictable amount of time.  Equipment Recommendations  None recommended by OT    Recommendations for Other Services       Precautions / Restrictions Precautions Precautions: Fall Precaution Comments: SBP<150 Restrictions Weight Bearing Restrictions: No       Mobility Bed Mobility Overal bed mobility: Modified Independent                  Transfers Overall transfer level: Needs assistance Equipment used: None Transfers: Sit to/from Stand Sit to Stand: Min guard           General transfer comment: pt with lateral drift initially in standing, requires UE support to steady. HA and reaching for head with movement      Balance Overall balance assessment: Needs assistance Sitting-balance support: No upper extremity supported, Feet supported Sitting balance-Leahy Scale: Good     Standing balance support: Single extremity supported, Reliant on assistive device for balance Standing balance-Leahy Scale: Poor                             ADL either performed or assessed with clinical judgement   ADL Overall ADL's : Needs assistance/impaired Eating/Feeding: Set up   Grooming: Standing;Min guard   Upper Body Bathing: Min guard;Sitting               Toilet Transfer: Solicitor;Ambulation   Toileting- Clothing Manipulation and Hygiene: Min guard;Sitting/lateral lean       Functional mobility during ADLs: Min guard General ADL Comments: pt reports HA with movement and resolves with laying down     Vision Baseline Vision/History: 1 Wears glasses Ability to See in Adequate Light: 0 Adequate Patient Visual Report: No change from baseline Additional Comments: noted to have some droop to R eye but able to open fully when cued     Perception     Praxis      Pertinent Vitals/Pain Pain Assessment Pain Assessment: Faces Faces Pain  Scale: Hurts even more Pain Location: head Pain Descriptors / Indicators: Headache Pain Intervention(s): Monitored during session, Repositioned     Hand Dominance Right   Extremity/Trunk Assessment Upper Extremity Assessment Upper Extremity Assessment: RUE deficits/detail RUE Deficits / Details: reports now with tremore sometimes that didnt happen  before. pt with decreased fine motor compared to left with functional task   Lower Extremity Assessment Lower Extremity Assessment: Defer to PT evaluation   Cervical / Trunk Assessment Cervical / Trunk Assessment: Normal   Communication Communication Communication: Other (comment);Expressive difficulties (word finding)   Cognition Arousal/Alertness: Awake/alert Behavior During Therapy: WFL for tasks assessed/performed Overall Cognitive Status: Within Functional Limits for tasks assessed Area of Impairment: Memory                     Memory: Decreased short-term memory               General Comments  RA and VSS, BP checked and stable    Exercises     Shoulder Instructions      Home Living Family/patient expects to be discharged to:: Private residence Living Arrangements: Spouse/significant other;Children Available Help at Discharge: Family;Available 24 hours/day Type of Home: House Home Access: Stairs to enter [employer] of Steps: 3 Entrance Stairs-Rails: Right Home Layout: One level     Bathroom Shower/Tub: Producer, television/film/video: Handicapped height     Home Equipment: Rollator (4 wheels);Cane - single point   Additional Comments: dog named Maggie      Prior Functioning/Environment Prior Level of Function : Independent/Modified Independent;Driving               ADLs Comments: enjoys church and bible study on wed and sunday        OT Problem List: Decreased activity tolerance;Impaired balance (sitting and/or standing)      OT Treatment/Interventions: Self-care/ADL training;Therapeutic exercise;Energy conservation;DME and/or AE instruction;Therapeutic activities;Patient/family education;Balance training    OT Goals(Current goals can be found in the care plan section) Acute Rehab OT Goals Patient Stated Goal: to go home OT Goal Formulation: With patient Time For Goal Achievement: 07/05/22 Potential to Achieve  Goals: Good  OT Frequency: Min 2X/week    Co-evaluation              AM-PAC OT "6 Clicks" Daily Activity     Outcome Measure Help from another person eating meals?: None Help from another person taking care of personal grooming?: None Help from another person toileting, which includes using toliet, bedpan, or urinal?: A Little Help from another person bathing (including washing, rinsing, drying)?: A Little Help from another person to put on and taking off regular upper body clothing?: A Little Help from another person to put on and taking off regular lower body clothing?: A Little 6 Click Score: 20   End of Session Equipment Utilized During Treatment: Gait belt Nurse Communication: Mobility status;Precautions  Activity Tolerance: Patient tolerated treatment well Patient left: in bed;with call bell/phone within reach;with bed alarm set;with family/visitor present  OT Visit Diagnosis: Unsteadiness on feet (R26.81);Muscle weakness (generalized) (M62.81)                Time: 1308-6578 OT Time Calculation (min): 20 min Charges:  OT General Charges $OT Visit: 1 Visit OT Evaluation $OT Eval Moderate Complexity: 1 Mod   Brynn, OTR/L  Acute Rehabilitation Services Office: [phone] .   Mateo Flow 06/21/2022, 2:14 PM

## 2022-06-22 ENCOUNTER — Inpatient Hospital Stay (HOSPITAL_COMMUNITY): Payer: Medicare PPO

## 2022-06-22 DIAGNOSIS — I619 Nontraumatic intracerebral hemorrhage, unspecified: Secondary | ICD-10-CM | POA: Diagnosis not present

## 2022-06-22 LAB — CBC
HCT: 34.3 % — ABNORMAL LOW (ref 36.0–46.0)
Hemoglobin: 11.2 g/dL — ABNORMAL LOW (ref 12.0–15.0)
MCH: 30.1 pg (ref 26.0–34.0)
MCHC: 32.7 g/dL (ref 30.0–36.0)
MCV: 92.2 fL (ref 80.0–100.0)
Platelets: 192 10*3/uL (ref 150–400)
RBC: 3.72 MIL/uL — ABNORMAL LOW (ref 3.87–5.11)
RDW: 13.4 % (ref 11.5–15.5)
WBC: 6.8 10*3/uL (ref 4.0–10.5)
nRBC: 0 % (ref 0.0–0.2)

## 2022-06-22 LAB — BASIC METABOLIC PANEL
Anion gap: 7 (ref 5–15)
Anion gap: 11 (ref 5–15)
BUN: 5 mg/dL — ABNORMAL LOW (ref 8–23)
BUN: <5 mg/dL — ABNORMAL LOW (ref 8–23)
CO2: 24 mmol/L (ref 22–32)
CO2: 21 mmol/L — ABNORMAL LOW (ref 22–32)
Calcium: 6.8 mg/dL — ABNORMAL LOW (ref 8.9–10.3)
Calcium: 6.8 mg/dL — ABNORMAL LOW (ref 8.9–10.3)
Chloride: 110 mmol/L (ref 98–111)
Chloride: 109 mmol/L (ref 98–111)
Creatinine, Ser: 0.54 mg/dL (ref 0.44–1.00)
Creatinine, Ser: 0.48 mg/dL (ref 0.44–1.00)
GFR, Estimated: >60 mL/min (ref >60)
GFR, Estimated: >60 mL/min (ref >60)
Glucose, Bld: 98 mg/dL (ref 70–99)
Glucose, Bld: 101 mg/dL — ABNORMAL HIGH (ref 70–99)
Potassium: 2.3 mmol/L — CL (ref 3.5–5.1)
Potassium: 3 mmol/L — ABNORMAL LOW (ref 3.5–5.1)
Sodium: 141 mmol/L (ref 135–145)
Sodium: 141 mmol/L (ref 135–145)

## 2022-06-22 LAB — LIPID PANEL
Cholesterol: 198 mg/dL (ref 0–200)
HDL: 59 mg/dL (ref >40)
LDL Cholesterol: 114 mg/dL — ABNORMAL HIGH (ref 0–99)
Total CHOL/HDL Ratio: 3.4 RATIO
Triglycerides: 126 mg/dL (ref <150)
VLDL: 25 mg/dL (ref 0–40)

## 2022-06-22 LAB — SODIUM
Sodium: 145 mmol/L (ref 135–145)
Sodium: 142 mmol/L (ref 135–145)
Sodium: 141 mmol/L (ref 135–145)

## 2022-06-22 LAB — PHOSPHORUS
Phosphorus: 1.2 mg/dL — ABNORMAL LOW (ref 2.5–4.6)
Phosphorus: 5.6 mg/dL — ABNORMAL HIGH (ref 2.5–4.6)

## 2022-06-22 LAB — MAGNESIUM: Magnesium: 2 mg/dL (ref 1.7–2.4)

## 2022-06-22 MED ORDER — POTASSIUM CHLORIDE CRYS ER 20 MEQ PO TBCR
40.0000 meq | EXTENDED_RELEASE_TABLET | Freq: Once | ORAL | Status: AC
  Administered 2022-06-23: 40 meq via ORAL
  Filled 2022-06-22: qty 2

## 2022-06-22 MED ORDER — TOPIRAMATE 25 MG PO TABS
25.0000 mg | ORAL_TABLET | Freq: Two times a day (BID) | ORAL | Status: DC
  Administered 2022-06-22 – 2022-06-24 (×6): 25 mg via ORAL
  Filled 2022-06-22 (×7): qty 1

## 2022-06-22 MED ORDER — POTASSIUM CHLORIDE 10 MEQ/50ML IV SOLN
10.0000 meq | INTRAVENOUS | Status: DC
  Administered 2022-06-22: 10 meq via INTRAVENOUS
  Filled 2022-06-22 (×2): qty 50

## 2022-06-22 MED ORDER — POTASSIUM CHLORIDE CRYS ER 20 MEQ PO TBCR
40.0000 meq | EXTENDED_RELEASE_TABLET | Freq: Once | ORAL | Status: AC
  Administered 2022-06-22: 40 meq via ORAL
  Filled 2022-06-22: qty 2

## 2022-06-22 MED ORDER — POTASSIUM PHOSPHATES 15 MMOLE/5ML IV SOLN
45.0000 mmol | Freq: Once | INTRAVENOUS | Status: AC
  Administered 2022-06-22: 45 mmol via INTRAVENOUS
  Filled 2022-06-22: qty 15

## 2022-06-22 MED ORDER — CALCIUM GLUCONATE-NACL 1-0.675 GM/50ML-% IV SOLN
1.0000 g | Freq: Once | INTRAVENOUS | Status: AC
  Administered 2022-06-22: 1000 mg via INTRAVENOUS
  Filled 2022-06-22: qty 50

## 2022-06-22 NOTE — Progress Notes (Addendum)
STROKE TEAM PROGRESS NOTE   INTERVAL HISTORY Husband is at the bedside, reporting worsening headache today. 3% saline increased to 78ml/hr (PICC placed). Topamax ordered 25mg  BID.  CT head this morning shows unchanged appearance of the left temporal lobe lobar hematoma with mild cytotoxic edema. Neurological shows right peripheral vision loss appears to be improving.  Blood pressure adequately controlled. Vitals:   06/22/22 0720 06/22/22 0730 06/22/22 0755 06/22/22 0800  BP: (!) 151/83 (!) 145/72 125/77 (!) 150/76  Pulse: 89 85 87 85  Resp: 20 20 17  (!) 23  Temp:      TempSrc:      SpO2: 95% 98% 99% 100%  Weight:      Height:       CBC:  Recent Labs  Lab 06/20/22 0737  WBC 11.6*  NEUTROABS 9.9*  HGB 12.0  HCT 35.9*  MCV 90.2  PLT 241    Basic Metabolic Panel:  Recent Labs  Lab 06/20/22 0736 06/20/22 1037 06/21/22 0421 06/21/22 1005 06/21/22 2242 06/22/22 0445  NA 133*   < > 145   < > 145 142  K 2.9*  --  2.9*  --   --   --   CL 96*  --  114*  --   --   --   CO2 24  --  22  --   --   --   GLUCOSE 128*  --  80  --   --   --   BUN 27*  --  14  --   --   --   CREATININE 0.60  --  0.57  --   --   --   CALCIUM 8.0*  --  6.9*  --   --   --   MG  --   --  2.1  --   --   --    < > = values in this interval not displayed.    Lipid Panel:  Recent Labs  Lab 06/21/22 1524  TRIG 179*   HgbA1c: No results for input(s): "HGBA1C" in the last 168 hours. Urine Drug Screen: No results for input(s): "LABOPIA", "COCAINSCRNUR", "LABBENZ", "AMPHETMU", "THCU", "LABBARB" in the last 168 hours.  Alcohol Level No results for input(s): "ETH" in the last 168 hours.  IMAGING past 24 hours CT HEAD WO CONTRAST (06/24/22)  Result Date: 06/22/2022 CLINICAL DATA:  Stroke follow-up EXAM: CT HEAD WITHOUT CONTRAST TECHNIQUE: Contiguous axial images were obtained from the base of the skull through the vertex without intravenous contrast. RADIATION DOSE REDUCTION: This exam was performed according  to the departmental dose-optimization program which includes automated exposure control, adjustment of the mA and/or kV according to patient size and/or use of iterative reconstruction technique. COMPARISON:  Brain MRI from 2 days ago FINDINGS: Brain: Unchanged size and shape of left temporal lobe hematoma with rim of edema. No new abnormality or worrisome mass effect. No hydrocephalus. Vascular: No hyperdense vessel or unexpected calcification. Skull: Normal. Negative for fracture or focal lesion. Sinuses/Orbits: No acute finding. IMPRESSION: Unchanged left temporal lobe hematoma and rim of edema. No worrisome mass effect. Electronically Signed   By: M.D.   On: 06/22/2022 06:24    PHYSICAL EXAM  Physical Exam  Constitutional: Appears well-developed and well-nourished.  Pleasant elderly Caucasian lady Cardiovascular: Normal rate and regular rhythm.  Respiratory: Effort normal, non-labored breathing  Neuro: Mental Status: Patient is awake, alert, oriented to person, place, month, year, and situation. Patient is able to give a clear  and coherent history. Still having word finding difficulties and difficulty identifying objects.  Mild expressive failure.  Good comprehension but impaired naming and repetition Cranial Nerves: II: Right partial peripheral field cut. Pupils are equal, round, and reactive to light.   III,IV, VI: EOMI without ptosis or diploplia.  V: Facial sensation is symmetric to temperature VII: Facial movement is symmetric resting and smiling VIII: Hearing is intact to voice X: Palate elevates symmetrically XI: Shoulder shrug is symmetric. XII: Tongue protrudes midline without atrophy or fasciculations.  Motor: Tone is normal. Bulk is normal. 5/5 strength was present in all four extremities.  Sensory: Sensation is symmetric to light touch and temperature in the arms and legs.  Cerebellar: FNF and HKS are intact bilaterally        ASSESSMENT/PLAN Ms.  Deborah Kerr is a 76 y.o. female with history of HTN, thyroid disease, arthritis, LBBB who states she was in her normal state, out shopping with husband on Sunday 12/17 and had sudden onset of headache, N/V. MRI shows a left posterior temporal and lateral occipital region hematoma. 3% saline infusing with Na 146, K 2.9.   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 Small volume of intraventricular extension into the atrium of the left lateral ventricle. CTA head & neck- Normal vasculature of the head and neck with no aneurysm or AVM. No evidence of active extravasation into the left temporal lobe intraparenchymal hematoma. MRI  Redemonstrated hematoma centered in the left posterior temporal and lateral occipital region, with an approximate volume of 31 mL, likely unchanged compared to the prior CT when accounting for differences in technique. No abnormal enhancement to suggest active extravasation. Mild surrounding edema with mass effect on the left lateral ventricle atrium, temporal horn, and occipital horn, with a small amount of left greater than right intraventricular extension. 12/21- CT Head - Unchanged left temporal lobe hematoma and rim of edema. No worrisome mass effect. 2D Echo EF 65-70% LDL No results found for requested labs within last 1095 days. HgbA1c No results found for requested labs within last 1095 days. VTE prophylaxis - SCDs    Diet   Diet Heart Room service appropriate? Yes; Fluid consistency: Thin   No antithrombotic prior to admission, now on No antithrombotic due to ICH Therapy recommendations:  Outpatient Disposition:  pending   Cerebral Edema with brain compression 3% saline- 47ml/hr -> increased to 85 mL/hr Na-  145->146 -> 142 -> 145  -> 142 -> 141 Head CT tomorrow morning   Headache Topamax 25mg  BID  Hypertension Home meds:  metoprolol succinate, losartan  Stable BP goal 130-150 Cleviprex IV  Other Stroke Risk Factors Advanced Age >/= 17  Obesity, Body mass index is 23.86 kg/m., BMI >/= 30 associated with increased stroke risk, recommend weight loss, diet and exercise as appropriate   Other Active Problems Arthritis Home meds- leflunomide  Hypokalemia  K 2.9 - 76 ordered -> 2.3 replaced again with Phos as well Repeat labs ordered for today  Hospital day # 2  Patient seen and examined by NP/APP with MD. MD to update note as needed.   , DNP, FNP-BC [facility] Pager: [phone]  I have personally obtained history,examined this patient, reviewed notes, independently viewed imaging studies, participated in medical decision making and plan  of care.ROS completed by me personally and pertinent positives fully documented  I have made any additions or clarifications directly to the above note. Agree with note above.  Patient neurological exam shows some improvement blood pressure adequately controlled.  She is complaining of increased headaches.  Start Topamax.  Mobilize out of bed.  Transfer to neurology floor bed.  Discussed with family at the bedside and answered questions.This patient is critically ill and at significant risk of neurological worsening, death and care requires constant monitoring of vital signs, hemodynamics,respiratory and cardiac monitoring, extensive review of multiple databases, frequent neurological assessment, discussion with family, other specialists and medical decision making of high complexity.I have made any additions or clarifications directly to the above note.This critical care time does not reflect procedure time, or teaching time or supervisory time of PA/NP/Med Resident etc but could involve care discussion time.  I spent 30  minutes of neurocritical care time  in the care of  this patient.      Delia Heady, MD Medical Director [facility] Pager: [phone] 06/22/2022 5:43 PM   To contact Stroke Continuity provider, please refer to [email or web address]. After hours, contact General Neurology

## 2022-06-22 NOTE — Plan of Care (Signed)
  Problem: Education: Goal: Knowledge of disease or condition will improve Outcome: Progressing Goal: Knowledge of secondary prevention will improve (MUST DOCUMENT ALL) Outcome: Progressing Goal: Knowledge of patient specific risk factors will improve Loraine Leriche N/A or DELETE if not current risk factor) Outcome: Progressing   Problem: Intracerebral Hemorrhage Tissue Perfusion: Goal: Complications of Intracerebral Hemorrhage will be minimized Outcome: Progressing   Problem: Coping: Goal: Will verbalize positive feelings about self Outcome: Progressing Goal: Will identify appropriate support needs Outcome: Progressing   Problem: Health Behavior/Discharge Planning: Goal: Ability to manage health-related needs will improve Outcome: Progressing Goal: Goals will be collaboratively established with patient/family Outcome: Progressing   Problem: Self-Care: Goal: Ability to participate in self-care as condition permits will improve Outcome: Progressing Goal: Verbalization of feelings and concerns over difficulty with self-care will improve Outcome: Progressing Goal: Ability to communicate needs accurately will improve Outcome: Progressing   Problem: Nutrition: Goal: Risk of aspiration will decrease Outcome: Progressing Goal: Dietary intake will improve Outcome: Progressing

## 2022-06-22 NOTE — Progress Notes (Signed)
Per MD Pearlean Brownie, goal is now SBP below 160; will titrate cleviprex accordingly.    Electronically signed: Jannifer Hick 06/22/2022

## 2022-06-23 ENCOUNTER — Inpatient Hospital Stay (HOSPITAL_COMMUNITY): Payer: Medicare PPO

## 2022-06-23 DIAGNOSIS — I619 Nontraumatic intracerebral hemorrhage, unspecified: Secondary | ICD-10-CM | POA: Diagnosis not present

## 2022-06-23 LAB — BASIC METABOLIC PANEL
Anion gap: 9 (ref 5–15)
BUN: <5 mg/dL — ABNORMAL LOW (ref 8–23)
CO2: 20 mmol/L — ABNORMAL LOW (ref 22–32)
Calcium: 6.5 mg/dL — ABNORMAL LOW (ref 8.9–10.3)
Chloride: 117 mmol/L — ABNORMAL HIGH (ref 98–111)
Creatinine, Ser: 0.54 mg/dL (ref 0.44–1.00)
GFR, Estimated: >60 mL/min (ref >60)
Glucose, Bld: 112 mg/dL — ABNORMAL HIGH (ref 70–99)
Potassium: 2.6 mmol/L — CL (ref 3.5–5.1)
Sodium: 146 mmol/L — ABNORMAL HIGH (ref 135–145)

## 2022-06-23 LAB — SODIUM
Sodium: 142 mmol/L (ref 135–145)
Sodium: 141 mmol/L (ref 135–145)
Sodium: 143 mmol/L (ref 135–145)

## 2022-06-23 LAB — HEMOGLOBIN A1C
Hgb A1c MFr Bld: 5.6 % (ref 4.8–5.6)
Mean Plasma Glucose: 114 mg/dL

## 2022-06-23 LAB — PHOSPHORUS: Phosphorus: 3.2 mg/dL (ref 2.5–4.6)

## 2022-06-23 LAB — MAGNESIUM: Magnesium: 1.6 mg/dL — ABNORMAL LOW (ref 1.7–2.4)

## 2022-06-23 MED ORDER — MAGNESIUM SULFATE 4 GM/100ML IV SOLN
4.0000 g | Freq: Once | INTRAVENOUS | Status: AC
  Administered 2022-06-23: 4 g via INTRAVENOUS
  Filled 2022-06-23: qty 100

## 2022-06-23 MED ORDER — METOPROLOL SUCCINATE ER 50 MG PO TB24
100.0000 mg | ORAL_TABLET | Freq: Every day | ORAL | Status: DC
  Administered 2022-06-24 – 2022-06-26 (×3): 100 mg via ORAL
  Filled 2022-06-23 (×3): qty 2

## 2022-06-23 MED ORDER — POTASSIUM CHLORIDE CRYS ER 20 MEQ PO TBCR
40.0000 meq | EXTENDED_RELEASE_TABLET | Freq: Once | ORAL | Status: AC
  Administered 2022-06-23: 40 meq via ORAL
  Filled 2022-06-23: qty 2

## 2022-06-23 MED ORDER — LEVOTHYROXINE SODIUM 25 MCG PO TABS
125.0000 ug | ORAL_TABLET | Freq: Every day | ORAL | Status: DC
  Administered 2022-06-24 – 2022-06-26 (×3): 125 ug via ORAL
  Filled 2022-06-23 (×3): qty 1

## 2022-06-23 MED ORDER — PANTOPRAZOLE SODIUM 40 MG PO TBEC
40.0000 mg | DELAYED_RELEASE_TABLET | Freq: Every day | ORAL | Status: DC
  Administered 2022-06-23 – 2022-06-25 (×3): 40 mg via ORAL
  Filled 2022-06-23 (×3): qty 1

## 2022-06-23 MED ORDER — POTASSIUM CHLORIDE 10 MEQ/50ML IV SOLN
10.0000 meq | INTRAVENOUS | Status: AC
  Administered 2022-06-23 (×4): 10 meq via INTRAVENOUS
  Filled 2022-06-23 (×4): qty 50

## 2022-06-23 NOTE — Progress Notes (Signed)
Occupational Therapy Treatment Patient Details Name: Deborah Kerr MRN: [id number] DOB: 26-Aug-1945 Today's Date: 06/23/2022   History of present illness 76 y.o. female presents to [facility] hospital on 06/20/2022 with sudden onset HA, nausea and vomiting. 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 PMH includes OA, HTN, thyroid disease.   OT comments  Pt progressed oob to adl task with HA immediately with OOB. Pt with cognitive and balance deficits that affect all adls. Pt has (A) from spouse 24/7 upon d/c. Recommendation for outpatient    Recommendations for follow up therapy are one component of a multi-disciplinary discharge planning process, led by the attending physician.  Recommendations may be updated based on patient status, additional functional criteria and insurance authorization.    Follow Up Recommendations  Outpatient OT     Assistance Recommended at Discharge Set up Supervision/Assistance  Patient can return home with the following  A little help with walking and/or transfers;Assistance with cooking/housework;Direct supervision/assist for medications management;Assist for transportation   Equipment Recommendations  None recommended by OT    Recommendations for Other Services      Precautions / Restrictions Precautions Precautions: Fall Precaution Comments: SBP<150       Mobility Bed Mobility Overal bed mobility: Modified Independent                  Transfers Overall transfer level: Needs assistance   Transfers: Sit to/from Stand Sit to Stand: Min guard                 Balance           Standing balance support: Bilateral upper extremity supported, During functional activity Standing balance-Leahy Scale: Fair                             ADL either performed or assessed with clinical judgement   ADL Overall ADL's : Needs assistance/impaired Eating/Feeding: Set up Eating/Feeding Details (indicate cue type and reason): pt and  spouse educated and encouraged to complete the 3 bite rule for all meals in the chair to help with activity tolerance and increase PO intake. pt with very little intake                 Lower Body Dressing: Modified independent Lower Body Dressing Details (indicate cue type and reason): don socks at [employer] Transfer: Economist and Hygiene: Min guard;Sitting/lateral lean       Functional mobility during ADLs: Min guard      Extremity/Trunk Assessment              Vision       Perception     Praxis      Cognition Arousal/Alertness: Awake/alert Behavior During Therapy: WFL for tasks assessed/performed Overall Cognitive Status: Impaired/Different from baseline Area of Impairment: Memory, Awareness                     Memory: Decreased short-term memory Following Commands: Follows one step commands with increased time   Awareness: Emergent   General Comments: pt reports "i am not as clear as i normally am. i am forgetting stuff" Pt also states" i can think of what i want to say but it wont come out sometimes"        Exercises      Shoulder Instructions       General Comments Bp 151/106 after movement in the hall  and bathroom. pt with dizzines in hallway with BP 145/90  Pt reports HA with upright posture    Pertinent Vitals/ Pain       Pain Assessment Pain Assessment: Faces Faces Pain Scale: Hurts little more Pain Location: head Pain Descriptors / Indicators: Headache Pain Intervention(s): Monitored during session, Repositioned (dizziness)  Home Living                                          Prior Functioning/Environment              Frequency  Min 2X/week        Progress Toward Goals  OT Goals(current goals can now be found in the care plan section)  Progress towards OT goals: Progressing toward goals  Acute Rehab OT Goals Patient Stated Goal: to go home  when they say i can OT Goal Formulation: With patient Time For Goal Achievement: 07/05/22 Potential to Achieve Goals: Good ADL Goals Pt Will Perform Lower Body Dressing: with modified independence;sit to/from stand Pt Will Transfer to Toilet: with modified independence;ambulating;regular height toilet Additional ADL Goal #1: pt will complete 5 step pathfinding task mod I with written instructions Additional ADL Goal #2: pt will complete recall 3 words after 1 minute  Plan Discharge plan remains appropriate    Co-evaluation                 AM-PAC OT "6 Clicks" Daily Activity     Outcome Measure   Help from another person eating meals?: None Help from another person taking care of personal grooming?: None Help from another person toileting, which includes using toliet, bedpan, or urinal?: A Little Help from another person bathing (including washing, rinsing, drying)?: A Little Help from another person to put on and taking off regular upper body clothing?: A Little Help from another person to put on and taking off regular lower body clothing?: A Little 6 Click Score: 20    End of Session Equipment Utilized During Treatment: Gait belt  OT Visit Diagnosis: Unsteadiness on feet (R26.81);Muscle weakness (generalized) (M62.81)   Activity Tolerance Patient tolerated treatment well   Patient Left in bed;with call bell/phone within reach;with bed alarm set;with family/visitor present   Nurse Communication Mobility status;Precautions        Time: 2263-3354 OT Time Calculation (min): 30 min  Charges: OT General Charges $OT Visit: 1 Visit OT Treatments $Self Care/Home Management : 23-37 mins   Brynn, OTR/L  Acute Rehabilitation Services Office: [phone] .   Mateo Flow 06/23/2022, 4:15 PM

## 2022-06-23 NOTE — Progress Notes (Signed)
STROKE TEAM PROGRESS NOTE   INTERVAL HISTORY Husband is at the bedside, patient states headache is improved but she feels tired.  Blood pressure adequately controlled.  She is still on Cleviprex drip.  Serum sodium is at 146 despite being on hypertonic saline.  Follow-up CT head this morning shows stable appearance of the left temporal hematoma.  Neurological exam is unchanged with partial left peripheral visual field defect persisting. Vitals:   06/23/22 1630 06/23/22 1645 06/23/22 1700 06/23/22 1715  BP: (!) 143/86 (!) 140/85 (!) 157/95 (!) 164/127  Pulse: 91 95 94 (!) 103  Resp: (!) 24 (!) 30 (!) 26 19  Temp:      TempSrc:      SpO2: 96% 99% 100% 99%  Weight:      Height:       CBC:  Recent Labs  Lab 06/20/22 0737 06/22/22 1000  WBC 11.6* 6.8  NEUTROABS 9.9*  --   HGB 12.0 11.2*  HCT 35.9* 34.3*  MCV 90.2 92.2  PLT 241 192   Basic Metabolic Panel:  Recent Labs  Lab 06/22/22 1000 06/22/22 1624 06/22/22 2144 06/23/22 0612 06/23/22 1026  NA 141   < > 141 146* 142  K 2.3*  --  3.0* 2.6*  --   CL 110  --  109 117*  --   CO2 24  --  21* 20*  --   GLUCOSE 98  --  101* 112*  --   BUN 5*  --  <5* <5*  --   CREATININE 0.54  --  0.48 0.54  --   CALCIUM 6.8*  --  6.8* 6.5*  --   MG 2.0  --   --  1.6*  --   PHOS 1.2*  --  5.6* 3.2  --    < > = values in this interval not displayed.   Lipid Panel:  Recent Labs  Lab 06/22/22 1000  CHOL 198  TRIG 126  HDL 59  CHOLHDL 3.4  VLDL 25  LDLCALC 629*   HgbA1c:  Recent Labs  Lab 06/22/22 1000  HGBA1C 5.6   Urine Drug Screen: No results for input(s): "LABOPIA", "COCAINSCRNUR", "LABBENZ", "AMPHETMU", "THCU", "LABBARB" in the last 168 hours.  Alcohol Level No results for input(s): "ETH" in the last 168 hours.  IMAGING past 24 hours CT HEAD WO CONTRAST ( )  Result Date: 06/23/2022 CLINICAL DATA:  Stroke follow-up EXAM: CT HEAD WITHOUT CONTRAST TECHNIQUE: Contiguous axial images were obtained from the base of the skull  through the vertex without intravenous contrast. RADIATION DOSE REDUCTION: This exam was performed according to the departmental dose-optimization program which includes automated exposure control, adjustment of the mA and/or kV according to patient size and/or use of iterative reconstruction technique. COMPARISON:  Yesterday FINDINGS: Brain: Unchanged size and shape of the left temporal hematoma with rim of edema. The hematoma measures up to 6 cm anterior to posterior on reformats. Unchanged local mass effect. Stable chronic white matter disease. No hydrocephalus or midline shift. Vascular: No hyperdense vessel or unexpected calcification. Skull: Normal. Negative for fracture or focal lesion. Sinuses/Orbits: Stable IMPRESSION: Unchanged left temporal lobe hematoma. Electronically Signed   By: Tiburcio Pea M.D.   On: 06/23/2022 05:54    PHYSICAL EXAM  Physical Exam  Constitutional: Appears well-developed and well-nourished.  Pleasant elderly Caucasian lady Cardiovascular: Normal rate and regular rhythm.  Respiratory: Effort normal, non-labored breathing  Neuro: Mental Status: Patient is awake, alert, oriented to person, place, month, year, and situation. Patient  is able to give a clear and coherent history. Still having word finding difficulties and difficulty identifying objects.  Mild expressive failure.  Good comprehension but impaired naming and repetition Cranial Nerves: II: Right partial peripheral field cut. Pupils are equal, round, and reactive to light.   III,IV, VI: EOMI without ptosis or diploplia.  V: Facial sensation is symmetric to temperature VII: Facial movement is symmetric resting and smiling VIII: Hearing is intact to voice X: Palate elevates symmetrically XI: Shoulder shrug is symmetric. XII: Tongue protrudes midline without atrophy or fasciculations.  Motor: Tone is normal. Bulk is normal. 5/5 strength was present in all four extremities.  Sensory: Sensation is  symmetric to light touch and temperature in the arms and legs.  Cerebellar: FNF and HKS are intact bilaterally        ASSESSMENT/PLAN Ms. Deborah Kerr is a 76 y.o. female with history of HTN, thyroid disease, arthritis, LBBB who states she was in her normal state, out shopping with husband on Sunday 12/17 and had sudden onset of headache, N/V. MRI shows a left posterior temporal and lateral occipital region hematoma. 3% saline infusing with Na 146, K 2.9.   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 Small volume of intraventricular extension into the atrium of the left lateral ventricle. CTA head & neck- Normal vasculature of the head and neck with no aneurysm or AVM. No evidence of active extravasation into the left temporal lobe intraparenchymal hematoma. MRI  Redemonstrated hematoma centered in the left posterior temporal and lateral occipital region, with an approximate volume of 31 mL, likely unchanged compared to the prior CT when accounting for differences in technique. No abnormal enhancement to suggest active extravasation. Mild surrounding edema with mass effect on the left lateral ventricle atrium, temporal horn, and occipital horn, with a small amount of left greater than right intraventricular extension. 12/21- CT Head - Unchanged left temporal lobe hematoma and rim of edema. No worrisome mass effect. 2D Echo EF 65-70% LDL No results found for requested labs within last 1095 days. HgbA1c No results found for requested labs within last 1095 days. VTE prophylaxis - SCDs    Diet   Diet Heart Room service appropriate? Yes; Fluid consistency: Thin   No antithrombotic prior to admission, now on No antithrombotic due to  ICH Therapy recommendations:  Outpatient Disposition:  pending   Cerebral Edema with brain compression 3% saline- 56ml/hr -> increased to 85 mL/hr Na- 145->146 -> 142 -> 145  -> 142 -> 141 Head CT tomorrow morning   Headache Topamax 25mg  BID  Hypertension Home meds:  metoprolol succinate, losartan  Stable BP goal 130-150 Cleviprex IV  Other Stroke Risk Factors Advanced Age >/= 18  Obesity, Body mass index is 23.86 kg/m., BMI >/= 30 associated with increased stroke risk, recommend weight loss, diet and exercise as appropriate   Other Active Problems Arthritis Home meds- leflunomide  Hypokalemia  K 2.9 - 76 ordered -> 2.3 replaced again with Phos as well Repeat labs ordered for today  Hospital day # 3   Patient neurological exam remained stable and blood pressure adequately controlled.  But she is yet requiring Cleviprex drip.   Headaches seem better on Topamax.  Mobilize out of bed.  Wean off Cleviprex drip and use as needed  IV labetalol and hydralazine.  Discussed with f husband at the bedside and answered questions. This patient is critically ill and at significant risk of neurological worsening, death and care requires constant monitoring of vital signs, hemodynamics,respiratory and cardiac monitoring, extensive review of multiple databases, frequent neurological assessment, discussion with family, other specialists and medical decision making of high complexity.I have made any additions or clarifications directly to the above note.This critical care time does not reflect procedure time, or teaching time or supervisory time of PA/NP/Med Resident etc but could involve care discussion time.  I spent 30 minutes of neurocritical care time  in the care of  this patient.         Delia Heady, MD Medical Director [facility] Pager: [phone] 06/23/2022 5:49 PM   To contact Stroke Continuity provider, please refer to [email or web address]. After hours, contact General  Neurology

## 2022-06-23 NOTE — Progress Notes (Signed)
Physical Therapy Treatment Patient Details Name: Deborah Kerr MRN: [id number] DOB: 06-16-1946 Today's Date: 06/23/2022   History of Present Illness 76 y.o. female presents to [facility] hospital on 06/20/2022 with sudden onset HA, nausea and vomiting. 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 PMH includes OA, HTN, thyroid disease.    PT Comments    Pt tolerates treatment well but does continue to demonstrate deficits in balance and gait quality. At this time the pt benefits from UE support to aide in stabilizing, PT suggests use of RW/rollator at this time when returning home to reduce risk of falls. PT also notes peripheral vision deficits along with poor memory and awareness, further increasing the pt's falls risk. Spouse is in agreement to provide frequent assistance/supervision for patient mobility when returning home. PT continues to recommend outpatient PT, family would prefer clinic in [place].   Recommendations for follow up therapy are one component of a multi-disciplinary discharge planning process, led by the attending physician.  Recommendations may be updated based on patient status, additional functional criteria and insurance authorization.  Follow Up Recommendations  Outpatient PT     Assistance Recommended at Discharge Intermittent Supervision/Assistance  Patient can return home with the following A little help with walking and/or transfers;A little help with bathing/dressing/bathroom;Assistance with cooking/housework;Assist for transportation;Help with stairs or ramp for entrance;Direct supervision/assist for medications management;Direct supervision/assist for financial management   Equipment Recommendations  None recommended by PT (pt owns SPC, RW, Rollator)    Recommendations for Other Services       Precautions / Restrictions Precautions Precautions: Fall Precaution Comments: SBP<150 Restrictions Weight Bearing Restrictions: No     Mobility  Bed Mobility Overal  bed mobility: Modified Independent                  Transfers Overall transfer level: Needs assistance Equipment used: None Transfers: Sit to/from Stand Sit to Stand: Min guard                Ambulation/Gait Ambulation/Gait assistance: Min guard Gait Distance (Feet): 400 Feet Assistive device: None (railing) Gait Pattern/deviations: Step-through pattern, Drifts right/left Gait velocity: reduced Gait velocity interpretation: <1.8 ft/sec, indicate of risk for recurrent falls   General Gait Details: pt with increased lateral drift, prefers to utilize support of railing at this time. One instance of staggering to left when without UE support   Stairs             Wheelchair Mobility    Modified Rankin (Stroke Patients Only)       Balance Overall balance assessment: Needs assistance Sitting-balance support: No upper extremity supported, Feet supported Sitting balance-Leahy Scale: Good     Standing balance support: No upper extremity supported, During functional activity Standing balance-Leahy Scale: Fair                              Cognition Arousal/Alertness: Awake/alert Behavior During Therapy: WFL for tasks assessed/performed Overall Cognitive Status: Impaired/Different from baseline Area of Impairment: Memory, Awareness, Following commands                     Memory: Decreased short-term memory Following Commands: Follows one step commands consistently, Follows multi-step commands inconsistently   Awareness: Emergent   General Comments: poor awareness of peripheral vision loss, poor recall of room number when distracted with other tasks        Exercises      General Comments General  comments (skin integrity, edema, etc.): VSS on RA, BP 146/97 upon completion of ambulation      Pertinent Vitals/Pain Pain Assessment Pain Assessment: Faces Faces Pain Scale: Hurts little more Pain Location: head Pain Descriptors /  Indicators: Headache Pain Intervention(s): Monitored during session    Home Living                          Prior Function            PT Goals (current goals can now be found in the care plan section) Acute Rehab PT Goals Patient Stated Goal: to return to independence, improve balance Progress towards PT goals: Progressing toward goals    Frequency    Min 3X/week      PT Plan Current plan remains appropriate    Co-evaluation              AM-PAC PT "6 Clicks" Mobility   Outcome Measure  Help needed turning from your back to your side while in a flat bed without using bedrails?: None Help needed moving from lying on your back to sitting on the side of a flat bed without using bedrails?: None Help needed moving to and from a bed to a chair (including a wheelchair)?: A Little Help needed standing up from a chair using your arms (e.g., wheelchair or bedside chair)?: A Little Help needed to walk in hospital room?: A Little Help needed climbing 3-5 steps with a railing? : A Little 6 Click Score: 20    End of Session   Activity Tolerance: Patient tolerated treatment well Patient left: in chair;with call bell/phone within reach;with chair alarm set;with family/visitor present Nurse Communication: Mobility status PT Visit Diagnosis: Other abnormalities of gait and mobility (R26.89)     Time: 2831-5176 PT Time Calculation (min) (ACUTE ONLY): 30 min  Charges:  $Gait Training: 8-22 mins $Self Care/Home Management: 8-22                     Arlyss Gandy, PT, DPT Acute Rehabilitation Office [phone]    Arlyss Gandy 06/23/2022, 11:03 AM

## 2022-06-24 DIAGNOSIS — I161 Hypertensive emergency: Secondary | ICD-10-CM | POA: Diagnosis not present

## 2022-06-24 DIAGNOSIS — I619 Nontraumatic intracerebral hemorrhage, unspecified: Secondary | ICD-10-CM | POA: Diagnosis not present

## 2022-06-24 DIAGNOSIS — I615 Nontraumatic intracerebral hemorrhage, intraventricular: Secondary | ICD-10-CM | POA: Diagnosis not present

## 2022-06-24 LAB — BASIC METABOLIC PANEL
Anion gap: 10 (ref 5–15)
Anion gap: 9 (ref 5–15)
BUN: <5 mg/dL — ABNORMAL LOW (ref 8–23)
BUN: <5 mg/dL — ABNORMAL LOW (ref 8–23)
CO2: 18 mmol/L — ABNORMAL LOW (ref 22–32)
CO2: 16 mmol/L — ABNORMAL LOW (ref 22–32)
Calcium: 6.2 mg/dL — CL (ref 8.9–10.3)
Calcium: 6.2 mg/dL — CL (ref 8.9–10.3)
Chloride: 118 mmol/L — ABNORMAL HIGH (ref 98–111)
Chloride: 118 mmol/L — ABNORMAL HIGH (ref 98–111)
Creatinine, Ser: 0.57 mg/dL (ref 0.44–1.00)
Creatinine, Ser: 0.66 mg/dL (ref 0.44–1.00)
GFR, Estimated: >60 mL/min (ref >60)
GFR, Estimated: >60 mL/min (ref >60)
Glucose, Bld: 99 mg/dL (ref 70–99)
Glucose, Bld: 89 mg/dL (ref 70–99)
Potassium: 2.7 mmol/L — CL (ref 3.5–5.1)
Potassium: 3.2 mmol/L — ABNORMAL LOW (ref 3.5–5.1)
Sodium: 146 mmol/L — ABNORMAL HIGH (ref 135–145)
Sodium: 143 mmol/L (ref 135–145)

## 2022-06-24 LAB — CBC
HCT: 37 % (ref 36.0–46.0)
Hemoglobin: 12.2 g/dL (ref 12.0–15.0)
MCH: 31 pg (ref 26.0–34.0)
MCHC: 33 g/dL (ref 30.0–36.0)
MCV: 93.9 fL (ref 80.0–100.0)
Platelets: 197 10*3/uL (ref 150–400)
RBC: 3.94 MIL/uL (ref 3.87–5.11)
RDW: 13.5 % (ref 11.5–15.5)
WBC: 6.4 10*3/uL (ref 4.0–10.5)
nRBC: 0 % (ref 0.0–0.2)

## 2022-06-24 LAB — MAGNESIUM: Magnesium: 2.1 mg/dL (ref 1.7–2.4)

## 2022-06-24 LAB — SODIUM
Sodium: 141 mmol/L (ref 135–145)
Sodium: 143 mmol/L (ref 135–145)

## 2022-06-24 LAB — PHOSPHORUS: Phosphorus: 3 mg/dL (ref 2.5–4.6)

## 2022-06-24 MED ORDER — LABETALOL HCL 5 MG/ML IV SOLN
10.0000 mg | INTRAVENOUS | Status: DC | PRN
  Administered 2022-06-24 (×2): 10 mg via INTRAVENOUS
  Filled 2022-06-24 (×2): qty 4

## 2022-06-24 MED ORDER — HYDROCHLOROTHIAZIDE 12.5 MG PO TABS
12.5000 mg | ORAL_TABLET | Freq: Every day | ORAL | Status: DC
  Administered 2022-06-24 – 2022-06-26 (×3): 12.5 mg via ORAL
  Filled 2022-06-24 (×3): qty 1

## 2022-06-24 MED ORDER — CALCIUM CARBONATE 1250 (500 CA) MG PO TABS
1.0000 | ORAL_TABLET | Freq: Every day | ORAL | Status: DC
  Administered 2022-06-24 – 2022-06-26 (×3): 1250 mg via ORAL
  Filled 2022-06-24 (×3): qty 1

## 2022-06-24 MED ORDER — SODIUM CHLORIDE 3 % IV SOLN
INTRAVENOUS | Status: DC
  Filled 2022-06-24: qty 500

## 2022-06-24 MED ORDER — POTASSIUM CHLORIDE 10 MEQ/100ML IV SOLN
10.0000 meq | INTRAVENOUS | Status: AC
  Administered 2022-06-24 (×6): 10 meq via INTRAVENOUS
  Filled 2022-06-24 (×6): qty 100

## 2022-06-24 MED ORDER — CALCIUM GLUCONATE-NACL 1-0.675 GM/50ML-% IV SOLN
1.0000 g | Freq: Once | INTRAVENOUS | Status: AC
  Administered 2022-06-24: 1000 mg via INTRAVENOUS
  Filled 2022-06-24: qty 50

## 2022-06-24 MED ORDER — POTASSIUM CHLORIDE CRYS ER 20 MEQ PO TBCR
40.0000 meq | EXTENDED_RELEASE_TABLET | ORAL | Status: DC
  Administered 2022-06-24: 40 meq via ORAL
  Filled 2022-06-24 (×2): qty 2

## 2022-06-24 MED ORDER — POTASSIUM CHLORIDE CRYS ER 20 MEQ PO TBCR
40.0000 meq | EXTENDED_RELEASE_TABLET | ORAL | Status: AC
  Administered 2022-06-24 (×2): 40 meq via ORAL
  Filled 2022-06-24: qty 2

## 2022-06-24 NOTE — Progress Notes (Signed)
Severely low potassium and calcium, will give IV supplementation and give oral calcium supplement.   Ritta Slot, MD [facility] [phone]  If 7pm- 7am, please page neurology on call as listed in [facility].

## 2022-06-24 NOTE — Progress Notes (Signed)
Physical Therapy Treatment Patient Details Name: Deborah Kerr MRN: [id number] DOB: 08-03-1945 Today's Date: 06/24/2022   History of Present Illness 76 y.o. female presents to [facility] hospital on 06/20/2022 with sudden onset HA, nausea and vomiting. 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 PMH includes OA, HTN, thyroid disease.    PT Comments    Session focused on assessing pt's gait safety with various AD, including RW vs rollator vs SPC. Pt had x1 LOB needing minA to recover when trying to ambulate with a SPC. She was able to remain proximal and manage the rollator without LOB at a min guard assist level, needing cues for brake management with fair carryover noted. Pt reported she does not have a RW or rollator at home, thus updated equipment recs to include a rollator as pt appears to be able to manage it safely but may need some more education on brake management with transfers. Pt reported a headache began during her 3rd gait bout, which was ~64 ft in distance. Cued pt to sit to check BP, which had increased, see below. Thus, returned pt to room via pt sitting on rollator in order for pt to return to bed and rest for pt safety. RN present and administered meds to lower BP. Pt reported her headache to be a 5/10 when ambulating and reduced to a 1-2/10 and that it was "almost gone" by the end of the session. Will continue to follow acutely. Current recommendations remain appropriate.   BP:  155/104 sitting start of session 169/106 standing during 3rd gait bout (longer bout) 178/116 sitting after 3rd gait bout (longer bout) with report of headache (returned pt to room to get into bed for safety) 184/103 supine 179/103 supine several minutes 116/75 supine after RN administered BP meds    Recommendations for follow up therapy are one component of a multi-disciplinary discharge planning process, led by the attending physician.  Recommendations may be updated based on patient status, additional  functional criteria and insurance authorization.  Follow Up Recommendations  Outpatient PT     Assistance Recommended at Discharge Intermittent Supervision/Assistance  Patient can return home with the following A little help with walking and/or transfers;A little help with bathing/dressing/bathroom;Assistance with cooking/housework;Assist for transportation;Help with stairs or ramp for entrance;Direct supervision/assist for medications management;Direct supervision/assist for financial management   Equipment Recommendations  Rollator (4 wheels)    Recommendations for Other Services       Precautions / Restrictions Precautions Precautions: Fall Precaution Comments: SBP<160 Restrictions Weight Bearing Restrictions: No     Mobility  Bed Mobility Overal bed mobility: Modified Independent             General bed mobility comments: HOB elevated, able to return to supine without assistance    Transfers Overall transfer level: Needs assistance Equipment used: Rolling walker (2 wheels), Rollator (4 wheels), Straight cane Transfers: Sit to/from Stand Sit to Stand: Min guard           General transfer comment: Pt coming to stand from recliner and commode to RW 1x each, from recliner to [facility] 1x, and from rollator 1x. Min guard for safety. Cues for rollator brake management with fair carryover noted.    Ambulation/Gait Ambulation/Gait assistance: Min guard, Min assist Gait Distance (Feet): 64 Feet (x4 bouts of ~15 ft > ~15 ft > ~64 ft > ~2 ft) Assistive device: Rolling walker (2 wheels), Rollator (4 wheels), Straight cane, None Gait Pattern/deviations: Step-through pattern, Drifts right/left, Trunk flexed Gait velocity: reduced Gait  velocity interpretation: <1.8 ft/sec, indicate of risk for recurrent falls   General Gait Details: Pt ambulating to bathroom and back with RW to start then transitioned to using SPC. Pt had x1 LOB to R using SPC, needing minA to recover.  Transitioned to rollator with no LOB, min guard for safety and cues for upright posture and brake management. Mid way down the hall during the longer (3rd gait bout) gait bout pt reported a headache that just began thus found an area out of the way for pt to sit in rollator to check BP. BP had gone up to 178/116. For pt safety, decided to roll pt back with her seated on the rollator and assist her back to bed to rest. No UE support to take couple steps from rollator seat to bed.   Stairs             Wheelchair Mobility    Modified Rankin (Stroke Patients Only) Modified Rankin (Stroke Patients Only) Pre-Morbid Rankin Score: No symptoms Modified Rankin: Moderate disability     Balance Overall balance assessment: Needs assistance Sitting-balance support: No upper extremity supported, Feet supported Sitting balance-Leahy Scale: Good Sitting balance - Comments: able to reach off BOS to perform pericare on toilet   Standing balance support: No upper extremity supported, During functional activity, Single extremity supported, Bilateral upper extremity supported Standing balance-Leahy Scale: Fair Standing balance comment: Pt with instability during dynamic standing, benefiting from bil UE support as pt had x1 LOB with 1 UE support and needed minA to recover.                            Cognition Arousal/Alertness: Awake/alert Behavior During Therapy: WFL for tasks assessed/performed Overall Cognitive Status: Impaired/Different from baseline Area of Impairment: Memory, Awareness, Following commands                     Memory: Decreased short-term memory Following Commands: Follows one step commands consistently, Follows multi-step commands inconsistently, Follows one step commands with increased time, Follows multi-step commands with increased time   Awareness: Emergent   General Comments: poor awareness of peripheral vision loss; repeats herself often; pt  evidently frustrated with her difficulty finding words and expressing she was        Exercises      General Comments General comments (skin integrity, edema, etc.): BP: 155/104 sitting start of session, 169/106 standing during 3rd gait bout (longer bout), 178/116 sitting after 3rd gait bout (longer bout) with report of headache (returned pt to room to get into bed for safety), 184/103 supine, 179/103 supine several minutes, 116/75 supine after RN administered BP meds      Pertinent Vitals/Pain Pain Assessment Pain Assessment: 0-10 Pain Score: 5  (with gait, 1-2 after resting supine end of session) Pain Location: headache Pain Descriptors / Indicators: Headache, Grimacing Pain Intervention(s): Limited activity within patient's tolerance, Monitored during session, Repositioned, RN gave pain meds during session    Home Living                          Prior Function            PT Goals (current goals can now be found in the care plan section) Acute Rehab PT Goals Patient Stated Goal: to return to independence, improve balance PT Goal Formulation: With patient Time For Goal Achievement: 07/05/22 Potential to Achieve Goals: Good Progress towards PT  goals: Progressing toward goals    Frequency    Min 3X/week      PT Plan Equipment recommendations need to be updated    Co-evaluation              AM-PAC PT "6 Clicks" Mobility   Outcome Measure  Help needed turning from your back to your side while in a flat bed without using bedrails?: None Help needed moving from lying on your back to sitting on the side of a flat bed without using bedrails?: None Help needed moving to and from a bed to a chair (including a wheelchair)?: A Little Help needed standing up from a chair using your arms (e.g., wheelchair or bedside chair)?: A Little Help needed to walk in hospital room?: A Little Help needed climbing 3-5 steps with a railing? : A Little 6 Click Score: 20     End of Session Equipment Utilized During Treatment: Gait belt Activity Tolerance: Patient tolerated treatment well Patient left: with call bell/phone within reach;in bed;with bed alarm set;with nursing/sitter in room Nurse Communication: Mobility status;Other (comment) (BP, HA) PT Visit Diagnosis: Other abnormalities of gait and mobility (R26.89);Unsteadiness on feet (R26.81)     Time: 9381-0175 PT Time Calculation (min) (ACUTE ONLY): 35 min  Charges:  $Gait Training: 23-37 mins                     Raymond Gurney, PT, DPT Acute Rehabilitation Services  Office: [phone]    Jewel Baize 06/24/2022, 3:28 PM

## 2022-06-24 NOTE — Progress Notes (Addendum)
STROKE TEAM PROGRESS NOTE   INTERVAL HISTORY Husband at bedside.  Patient sitting in chair, doing well, still has intermittent word finding difficulty and paraphasic errors.  Still on 3% saline, will taper off.  Increase p.o. BP meds, taper off Cleviprex.  Vitals:   06/24/22 1100 06/24/22 1115 06/24/22 1130 06/24/22 1145  BP: (!) 155/92 (!) 151/112 (!) 163/88 (!) 142/84  Pulse: 86 88 88 87  Resp: (!) 24 (!) 26 (!) 21 16  Temp:      TempSrc:      SpO2: 96% 99% 99% 99%  Weight:      Height:       CBC:  Recent Labs  Lab 06/20/22 0737 06/22/22 1000 06/24/22 0413  WBC 11.6* 6.8 6.4  NEUTROABS 9.9*  --   --   HGB 12.0 11.2* 12.2  HCT 35.9* 34.3* 37.0  MCV 90.2 92.2 93.9  PLT 241 192 197   Basic Metabolic Panel:  Recent Labs  Lab 06/23/22 0612 06/23/22 1026 06/24/22 0413 06/24/22 0956  NA 146*   < > 146* 141  K 2.6*  --  2.7*  --   CL 117*  --  118*  --   CO2 20*  --  18*  --   GLUCOSE 112*  --  99  --   BUN <5*  --  <5*  --   CREATININE 0.54  --  0.57  --   CALCIUM 6.5*  --  6.2*  --   MG 1.6*  --  2.1  --   PHOS 3.2  --  3.0  --    < > = values in this interval not displayed.   Lipid Panel:  Recent Labs  Lab 06/22/22 1000  CHOL 198  TRIG 126  HDL 59  CHOLHDL 3.4  VLDL 25  LDLCALC 191*   HgbA1c:  Recent Labs  Lab 06/22/22 1000  HGBA1C 5.6   Urine Drug Screen: No results for input(s): "LABOPIA", "COCAINSCRNUR", "LABBENZ", "AMPHETMU", "THCU", "LABBARB" in the last 168 hours.  Alcohol Level No results for input(s): "ETH" in the last 168 hours.  IMAGING past 24 hours No results found.  PHYSICAL EXAM  Physical Exam  Constitutional: Appears well-developed and well-nourished.  Pleasant elderly Caucasian lady Cardiovascular: Normal rate and regular rhythm.  Respiratory: Effort normal, non-labored breathing  Neuro - awake, alert, eyes open, orientated to age, place, time and people. No aphasia but intermittent word finding difficulty with paraphasic  errors, following all simple commands. Able to name 4/5 and repeat without problem. No gaze palsy, tracking bilaterally, visual field exam showed right upper quadrantanopia, PERRL. Slight right facial droop. Tongue midline. Bilateral UEs 5/5, no drift. Bilaterally LEs 5/5, no drift. Sensation symmetrical bilaterally, b/l FTN intact, gait not tested.    ASSESSMENT/PLAN Ms. Deborah Kerr is a 76 y.o. female with history of HTN, thyroid disease, arthritis, LBBB who states she was in her normal state, out shopping with husband on Sunday 12/17 and had sudden onset of headache, N/V. MRI shows a left posterior temporal and lateral occipital region hematoma. 3% saline infusing with Na 146, K 2.9.   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  Small IVH at left lateral ventricle. CTA head & neck- Normal vasculature of the head and neck with no aneurysm or AVM.  MRI  Redemonstrated ICH centered in the left posterior temporal and lateral occipital region,  likely unchanged.  Mild surrounding edema with mass effect on the left lateral ventricle atrium, temporal horn, and occipital horn, with a small amount of left greater than right IVH. 12/21 and 12/22- CT Head x 2 - Unchanged left temporal lobe hematoma and rim of edema. No worrisome mass effect. 2D Echo EF 65-70% LDL 114 HgbA1c 5.6 VTE prophylaxis - SCDs No antithrombotic prior to admission, now on No antithrombotic due to ICH Therapy recommendations:  Outpatient Disposition:  pending   Cerebral Edema with brain compression 3% saline- 76ml/hr -> 90 mL/hr Na- 145->146 -> 142 -> 145  -> 142 -> 141 Head CT repeat stable Clinically stable Taper off 3% saline  Headache Topamax 25mg  BID Improved  Hypertension Home meds:  metoprolol succinate, losartan  Stable on low-dose Cleviprex Add home metoprolol 50->100, cozaar 100,  and HCTZ 12.5 BP goal < 160 Taper off Cleviprex IV as able  Other Stroke Risk Factors Advanced Age >/= 88   Other Active Problems Arthritis Home meds- leflunomide  Hypokalemia  K 2.9 -> 2.3 ->2.7 -supplement Repeat labs ordered for today Check magnesium in a.m. Hypocalcemia Ca 6.5-6.2-6.2 supplement  Hospital day # 4  This patient is critically ill due to ICH and IVH, hypertensive emergency, cerebral edema and at significant risk of neurological worsening, death form brain herniation, hematoma expansion, hypertensive encephalopathy, seizure. This patient's care requires constant monitoring of vital signs, hemodynamics, respiratory and cardiac monitoring, review of multiple databases, neurological assessment, discussion with family, other specialists and medical decision making of high complexity. I spent 35 minutes of neurocritical care time in the care of this patient. I had long discussion with husband and patient at bedside, updated pt current condition, treatment plan and potential prognosis, and answered all the questions.  They expressed understanding and appreciation.    75, MD PhD Stroke Neurology 06/24/2022 6:22 PM        To contact Stroke Continuity provider, please refer to 06/26/2022. After hours, contact General Neurology

## 2022-06-24 NOTE — Progress Notes (Signed)
   06/24/22 0530  Provider Notification  Provider Name/Title MD Amada Jupiter  Date Provider Notified 06/24/22  Time Provider Notified [phone]  Method of Notification Page  Notification Reason Critical Result  Test performed and critical result K 2.7, Ca 6.2  Date Critical Result Received 06/24/22  Time Critical Result Received 0530  Provider response See new orders  Date of Provider Response 06/24/22  Time of Provider Response [phone]

## 2022-06-25 DIAGNOSIS — I619 Nontraumatic intracerebral hemorrhage, unspecified: Secondary | ICD-10-CM | POA: Diagnosis not present

## 2022-06-25 LAB — BASIC METABOLIC PANEL
Anion gap: 7 (ref 5–15)
BUN: <5 mg/dL — ABNORMAL LOW (ref 8–23)
CO2: 17 mmol/L — ABNORMAL LOW (ref 22–32)
Calcium: 6.8 mg/dL — ABNORMAL LOW (ref 8.9–10.3)
Chloride: 120 mmol/L — ABNORMAL HIGH (ref 98–111)
Creatinine, Ser: 0.72 mg/dL (ref 0.44–1.00)
GFR, Estimated: >60 mL/min (ref >60)
Glucose, Bld: 93 mg/dL (ref 70–99)
Potassium: 3.8 mmol/L (ref 3.5–5.1)
Sodium: 144 mmol/L (ref 135–145)

## 2022-06-25 LAB — CBC
HCT: 34.4 % — ABNORMAL LOW (ref 36.0–46.0)
Hemoglobin: 11 g/dL — ABNORMAL LOW (ref 12.0–15.0)
MCH: 30.4 pg (ref 26.0–34.0)
MCHC: 32 g/dL (ref 30.0–36.0)
MCV: 95 fL (ref 80.0–100.0)
Platelets: 177 10*3/uL (ref 150–400)
RBC: 3.62 MIL/uL — ABNORMAL LOW (ref 3.87–5.11)
RDW: 13.5 % (ref 11.5–15.5)
WBC: 7.8 10*3/uL (ref 4.0–10.5)
nRBC: 0 % (ref 0.0–0.2)

## 2022-06-25 LAB — MAGNESIUM: Magnesium: 1.8 mg/dL (ref 1.7–2.4)

## 2022-06-25 MED ORDER — BUTALBITAL-APAP-CAFFEINE 50-325-40 MG PO TABS
1.0000 | ORAL_TABLET | Freq: Three times a day (TID) | ORAL | Status: DC | PRN
  Administered 2022-06-25 – 2022-06-26 (×3): 1 via ORAL
  Filled 2022-06-25 (×3): qty 1

## 2022-06-25 MED ORDER — POTASSIUM CHLORIDE CRYS ER 20 MEQ PO TBCR
40.0000 meq | EXTENDED_RELEASE_TABLET | Freq: Once | ORAL | Status: AC
  Administered 2022-06-25: 40 meq via ORAL
  Filled 2022-06-25: qty 2

## 2022-06-25 MED ORDER — LABETALOL HCL 5 MG/ML IV SOLN
10.0000 mg | INTRAVENOUS | Status: DC | PRN
  Administered 2022-06-25: 20 mg via INTRAVENOUS
  Filled 2022-06-25: qty 4

## 2022-06-25 MED ORDER — MAGNESIUM SULFATE 2 GM/50ML IV SOLN
2.0000 g | Freq: Once | INTRAVENOUS | Status: AC
  Administered 2022-06-25: 2 g via INTRAVENOUS
  Filled 2022-06-25: qty 50

## 2022-06-25 MED ORDER — SODIUM CHLORIDE 3 % IV SOLN: INTRAVENOUS | Status: DC

## 2022-06-25 NOTE — Progress Notes (Signed)
STROKE TEAM PROGRESS NOTE   INTERVAL HISTORY Husband at bedside.  Patient sitting in chair, doing well, complaining bifrontal headache.  On low-dose 3% saline, will taper off this morning.  BP stable, off Cleviprex.  PT 17 recommend outpatient  Vitals:   06/25/22 1500 06/25/22 1600 06/25/22 1700 06/25/22 1800  BP: 119/82 122/77 (!) 153/91 (!) 152/82  Pulse: 75 80 90 83  Resp: 15 (!) 22 19 14   Temp:  99.3 F (37.4 C)    TempSrc:  Oral    SpO2: 96% 97% 96% 98%  Weight:      Height:       CBC:  Recent Labs  Lab 06/20/22 0737 06/22/22 1000 06/24/22 0413 06/25/22 0353  WBC 11.6*   < > 6.4 7.8  NEUTROABS 9.9*  --   --   --   HGB 12.0   < > 12.2 11.0*  HCT 35.9*   < > 37.0 34.4*  MCV 90.2   < > 93.9 95.0  PLT 241   < > 197 177   < > = values in this interval not displayed.   Basic Metabolic Panel:  Recent Labs  Lab 06/23/22 0612 06/23/22 1026 06/24/22 0413 06/24/22 0956 06/24/22 1724 06/24/22 2210 06/25/22 0353  NA 146*   < > 146*   < > 143 143 144  K 2.6*  --  2.7*  --  3.2*  --  3.8  CL 117*  --  118*  --  118*  --  120*  CO2 20*  --  18*  --  16*  --  17*  GLUCOSE 112*  --  99  --  89  --  93  BUN <5*  --  <5*  --  <5*  --  <5*  CREATININE 0.54  --  0.57  --  0.66  --  0.72  CALCIUM 6.5*  --  6.2*  --  6.2*  --  6.8*  MG 1.6*  --  2.1  --   --   --  1.8  PHOS 3.2  --  3.0  --   --   --   --    < > = values in this interval not displayed.   Lipid Panel:  Recent Labs  Lab 06/22/22 1000  CHOL 198  TRIG 126  HDL 59  CHOLHDL 3.4  VLDL 25  LDLCALC 06/24/22*   HgbA1c:  Recent Labs  Lab 06/22/22 1000  HGBA1C 5.6   Urine Drug Screen: No results for input(s): "LABOPIA", "COCAINSCRNUR", "LABBENZ", "AMPHETMU", "THCU", "LABBARB" in the last 168 hours.  Alcohol Level No results for input(s): "ETH" in the last 168 hours.  IMAGING past 24 hours No results found.  PHYSICAL EXAM  Physical Exam  Constitutional: Appears well-developed and well-nourished.  Pleasant  elderly Caucasian lady Cardiovascular: Normal rate and regular rhythm.  Respiratory: Effort normal, non-labored breathing  Neuro - awake, alert, eyes open, orientated to age, place, time and people. No aphasia but intermittent word finding difficulty with paraphasic errors, following all simple commands. Able to name 4/5 and repeat without problem. No gaze palsy, tracking bilaterally, visual field exam showed right upper quadrantanopia, PERRL. Slight right facial droop. Tongue midline. Bilateral UEs 5/5, no drift. Bilaterally LEs 5/5, no drift. Sensation symmetrical bilaterally, b/l FTN intact, gait not tested.    ASSESSMENT/PLAN Ms. Deborah Kerr is a 76 y.o. female with history of HTN, thyroid disease, arthritis, LBBB who states she was in her normal state, out shopping  with husband on Sunday 12/17 and had sudden onset of headache, N/V. MRI shows a left posterior temporal and lateral occipital region hematoma. 3% saline infusing with Na 146, K 2.9.   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  Small IVH at left lateral ventricle. CTA head & neck- Normal vasculature of the head and neck with no aneurysm or AVM.  MRI  Redemonstrated ICH centered in the left posterior temporal and lateral occipital region, likely unchanged.  Mild surrounding edema with mass effect on the left lateral ventricle atrium, temporal horn, and occipital horn, with a small amount of left greater than right IVH. 12/21 and 12/22- CT Head x 2 - Unchanged left temporal lobe hematoma and rim of edema. No worrisome mass effect. 2D Echo EF 65-70% LDL 114 HgbA1c 5.6 VTE prophylaxis - SCDs No antithrombotic prior to admission, now on No antithrombotic due to ICH Therapy recommendations:  Outpatient PT/OT Disposition:  pending   Cerebral Edema with brain compression 3% saline- 73ml/hr -> 90  mL/hr->45->22.5->off Na- 145->146 -> 142 -> 145  -> 142 -> 141->144 Head CT repeat stable Clinically stable  Headache Likely related to ICH and IVH Topamax 25mg  BID -> d/c due to low bicarb in BMP Fioricet and Tylenol as needed  Hypertension Home meds:  metoprolol succinate, losartan  Stable on low-dose Cleviprex -> off now Add home metoprolol 50->100, cozaar 100, and HCTZ 12.5 BP goal < 160 Long-term BP goal normotensive  Other Stroke Risk Factors Advanced Age >/= 77   Other Active Problems Arthritis Home meds- leflunomide  Hypokalemia  K 2.9 -> 2.3 ->2.7 -supplement Repeat labs ordered for today Check magnesium in a.m. Hypocalcemia Ca 6.5-6.2-6.2 supplement  Hospital day # 5  This patient is critically ill due to ICH and IVH, hypertensive emergency, cerebral edema and at significant risk of neurological worsening, death form brain herniation, hematoma expansion, hypertensive encephalopathy, seizure. This patient's care requires constant monitoring of vital signs, hemodynamics, respiratory and cardiac monitoring, review of multiple databases, neurological assessment, discussion with family, other specialists and medical decision making of high complexity. I spent 35 minutes of neurocritical care time in the care of this patient. I had long discussion with husband and patient at bedside, updated pt current condition, treatment plan and potential prognosis, and answered all the questions.  They expressed understanding and appreciation.    75, MD PhD Stroke Neurology 06/25/2022 6:55 PM        To contact Stroke Continuity provider, please refer to 06/27/2022. After hours, contact General Neurology

## 2022-06-26 DIAGNOSIS — E039 Hypothyroidism, unspecified: Secondary | ICD-10-CM | POA: Insufficient documentation

## 2022-06-26 DIAGNOSIS — I10 Essential (primary) hypertension: Secondary | ICD-10-CM | POA: Insufficient documentation

## 2022-06-26 DIAGNOSIS — E876 Hypokalemia: Secondary | ICD-10-CM | POA: Diagnosis not present

## 2022-06-26 DIAGNOSIS — G935 Compression of brain: Secondary | ICD-10-CM | POA: Insufficient documentation

## 2022-06-26 DIAGNOSIS — I619 Nontraumatic intracerebral hemorrhage, unspecified: Secondary | ICD-10-CM | POA: Diagnosis not present

## 2022-06-26 DIAGNOSIS — R519 Headache, unspecified: Secondary | ICD-10-CM | POA: Insufficient documentation

## 2022-06-26 DIAGNOSIS — G936 Cerebral edema: Secondary | ICD-10-CM | POA: Diagnosis present

## 2022-06-26 LAB — BASIC METABOLIC PANEL
Anion gap: 11 (ref 5–15)
BUN: 6 mg/dL — ABNORMAL LOW (ref 8–23)
CO2: 18 mmol/L — ABNORMAL LOW (ref 22–32)
Calcium: 6.7 mg/dL — ABNORMAL LOW (ref 8.9–10.3)
Chloride: 111 mmol/L (ref 98–111)
Creatinine, Ser: 0.68 mg/dL (ref 0.44–1.00)
GFR, Estimated: >60 mL/min (ref >60)
Glucose, Bld: 91 mg/dL (ref 70–99)
Potassium: 3.1 mmol/L — ABNORMAL LOW (ref 3.5–5.1)
Sodium: 140 mmol/L (ref 135–145)

## 2022-06-26 LAB — CBC
HCT: 33 % — ABNORMAL LOW (ref 36.0–46.0)
Hemoglobin: 10.7 g/dL — ABNORMAL LOW (ref 12.0–15.0)
MCH: 30.3 pg (ref 26.0–34.0)
MCHC: 32.4 g/dL (ref 30.0–36.0)
MCV: 93.5 fL (ref 80.0–100.0)
Platelets: 166 10*3/uL (ref 150–400)
RBC: 3.53 MIL/uL — ABNORMAL LOW (ref 3.87–5.11)
RDW: 13.4 % (ref 11.5–15.5)
WBC: 7.1 10*3/uL (ref 4.0–10.5)
nRBC: 0 % (ref 0.0–0.2)

## 2022-06-26 LAB — T4: T4, Total: 5.8 ug/dL (ref 4.5–12.0)

## 2022-06-26 MED ORDER — CALCIUM CARBONATE 1250 (500 CA) MG PO TABS: 1.0000 | ORAL_TABLET | Freq: Three times a day (TID) | ORAL | 0 refills | Status: AC

## 2022-06-26 MED ORDER — CALCIUM CARBONATE 1250 (500 CA) MG PO TABS: 1.0000 | ORAL_TABLET | Freq: Every day | ORAL | 0 refills | Status: DC

## 2022-06-26 MED ORDER — POTASSIUM CHLORIDE CRYS ER 20 MEQ PO TBCR
40.0000 meq | EXTENDED_RELEASE_TABLET | Freq: Once | ORAL | Status: AC
  Administered 2022-06-26: 40 meq via ORAL
  Filled 2022-06-26: qty 2

## 2022-06-26 MED ORDER — METOPROLOL SUCCINATE ER 100 MG PO TB24: 100.0000 mg | ORAL_TABLET | Freq: Every day | ORAL | 0 refills | Status: AC

## 2022-06-26 MED ORDER — SODIUM CHLORIDE 0.9 % IV SOLN
4.0000 g | Freq: Once | INTRAVENOUS | Status: AC
  Administered 2022-06-26: 4 g via INTRAVENOUS
  Filled 2022-06-26: qty 40

## 2022-06-26 NOTE — Progress Notes (Signed)
RUE PICC removed. Site unremarkable. Vaseline gauze and gauze pressure dressing applied. Pt instructed to remain flat in bed for :30. Leave dressing intact for 24 hours. She and husband voiced understanding.

## 2022-06-26 NOTE — TOC Transition Note (Signed)
Transition of Care [facility]) - CM/SW Discharge Note   Patient Details  Name: Deborah Kerr MRN: [id number] Date of Birth: 06-17-1946  [facility] of [facility]) CM/SW Contact:  Tom-Eddleman, Hershal Coria, RN Phone Number: 06/26/2022, 2:17 PM   Clinical Narrative:     Patient is scheduled for discharge today. Outpatient info on AVS. Family to transport at discharge. No further TOC needs noted.       Final next level of care: OP Rehab Barriers to Discharge: Barriers Resolved   Patient Goals and CMS Choice CMS Medicare.gov Compare Post Acute Care list provided to:: Patient Choice offered to / list presented to : Patient  Discharge Placement                  Patient to be transferred to facility by: Family      Discharge Plan and Services Additional resources added to the After Visit Summary for                  DME Arranged: N/A DME Agency: NA       HH Arranged: NA HH Agency: NA        Social Determinants of Health (SDOH) Interventions SDOH Screenings   Tobacco Use: Low Risk  (06/20/2022)     Readmission Risk Interventions     No data to display

## 2022-06-26 NOTE — Discharge Summary (Addendum)
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   calcium carbonate 1250 (500 Ca) MG tablet Commonly known as: OS-CAL - dosed in mg of elemental calcium Take 1 tablet (1,250 mg total) by mouth 3 (three) times daily with meals.   hydrochlorothiazide 12.5 MG tablet Commonly known as: HYDRODIURIL Take 12.5 mg by mouth daily.   hydroxychloroquine 200 MG tablet Commonly known as: PLAQUENIL Take 200 mg by mouth daily with lunch.   leflunomide 20 MG tablet Commonly known as: ARAVA Take 20 mg by mouth daily.   levothyroxine 125 MCG tablet Commonly known as: SYNTHROID Take 125 mcg by mouth daily before breakfast.   losartan 100 MG tablet Commonly known as: COZAAR Take 100 mg by mouth daily.   metoprolol succinate 100 MG 24 hr tablet Commonly known as: TOPROL-XL Take 1 tablet (100 mg total) by mouth daily. Take with or immediately following a meal. Start taking on: June 27, 2022 What changed:  medication strength how much to take when to take this additional instructions   polyethylene glycol 17 g packet Commonly known as: MIRALAX / GLYCOLAX Take 17 g by mouth daily.   potassium chloride SA 20  MEQ tablet Commonly known as: KLOR-CON M Take 20 mEq by mouth daily.        LABORATORY STUDIES CBC    Component Value Date/Time   WBC 7.1 06/26/2022 0420   RBC 3.53 (L) 06/26/2022 0420   HGB 10.7 (L) 06/26/2022 0420   HCT 33.0 (L) 06/26/2022 0420   PLT 166 06/26/2022 0420   MCV 93.5 06/26/2022 0420   MCH 30.3 06/26/2022 0420   MCHC 32.4 06/26/2022 0420   RDW 13.4 06/26/2022 0420   LYMPHSABS 0.6 (L) 06/20/2022 0737   MONOABS 0.9 06/20/2022 0737   EOSABS 0.0 06/20/2022 0737   BASOSABS 0.0 06/20/2022 0737   CMP    Component Value Date/Time   NA 140 06/26/2022 0420   K 3.1 (L) 06/26/2022 0420   CL 111 06/26/2022 0420   CO2 18 (L) 06/26/2022 0420   GLUCOSE 91 06/26/2022 0420   BUN 6 (L) 06/26/2022 0420   CREATININE 0.68 06/26/2022 0420   CALCIUM 6.7 (L) 06/26/2022 0420   PROT 7.5 06/20/2022 0736   ALBUMIN 4.3 06/20/2022 0736   AST 24 06/20/2022 0736   ALT 14 06/20/2022 0736   ALKPHOS 60 06/20/2022 0736   BILITOT 0.8 06/20/2022 0736   GFRNONAA >60 06/26/2022 0420   COAGS Lab Results  Component Value Date   INR 1.1 06/20/2022   Lipid Panel    Component Value Date/Time   CHOL  198 06/22/2022 1000   TRIG 126 06/22/2022 1000   HDL 59 06/22/2022 1000   CHOLHDL 3.4 06/22/2022 1000   VLDL 25 06/22/2022 1000   LDLCALC 114 (H) 06/22/2022 1000   HgbA1C  Lab Results  Component Value Date   HGBA1C 5.6 06/22/2022   Urinalysis    Component Value Date/Time   COLORURINE STRAW (A) 06/20/2022 2346   APPEARANCEUR CLEAR 06/20/2022 2346   LABSPEC 1.017 06/20/2022 2346   PHURINE 6.0 06/20/2022 2346   GLUCOSEU NEGATIVE 06/20/2022 2346   HGBUR NEGATIVE 06/20/2022 2346   BILIRUBINUR NEGATIVE 06/20/2022 2346   KETONESUR 20 (A) 06/20/2022 2346   PROTEINUR NEGATIVE 06/20/2022 2346   NITRITE NEGATIVE 06/20/2022 2346   LEUKOCYTESUR TRACE (A) 06/20/2022 2346   Urine Drug Screen No results found for: "LABOPIA", "COCAINSCRNUR", "LABBENZ", "AMPHETMU", "THCU", "LABBARB"   Alcohol Level No results found for: "ETH"   SIGNIFICANT DIAGNOSTIC STUDIES CT HEAD WO CONTRAST ( )  Result Date: 06/23/2022 CLINICAL DATA:  Stroke follow-up EXAM: CT HEAD WITHOUT CONTRAST TECHNIQUE: Contiguous axial images were obtained from the base of the skull through the vertex without intravenous contrast. RADIATION DOSE REDUCTION: This exam was performed according to the departmental dose-optimization program which includes automated exposure control, adjustment of the mA and/or kV according to patient size and/or use of iterative reconstruction technique. COMPARISON:  Yesterday FINDINGS: Brain: Unchanged size and shape of the left temporal hematoma with rim of edema. The hematoma measures up to 6 cm anterior to posterior on reformats. Unchanged local mass effect. Stable chronic white matter disease. No hydrocephalus or midline shift. Vascular: No hyperdense vessel or unexpected calcification. Skull: Normal. Negative for fracture or focal lesion. Sinuses/Orbits: Stable IMPRESSION: Unchanged left temporal lobe hematoma. Electronically Signed   By: Tiburcio Pea M.D.   On: 06/23/2022 05:54   CT HEAD WO CONTRAST ( )  Result Date: 06/22/2022 CLINICAL DATA:  Stroke follow-up EXAM: CT HEAD WITHOUT CONTRAST TECHNIQUE: Contiguous axial images were obtained from the base of the skull through the vertex without intravenous contrast. RADIATION DOSE REDUCTION: This exam was performed according to the departmental dose-optimization program which includes automated exposure control, adjustment of the mA and/or kV according to patient size and/or use of iterative reconstruction technique. COMPARISON:  Brain MRI from 2 days ago FINDINGS: Brain: Unchanged size and shape of left temporal lobe hematoma with rim of edema. No new abnormality or worrisome mass effect. No hydrocephalus. Vascular: No hyperdense vessel or unexpected calcification. Skull: Normal. Negative for fracture or focal lesion.  Sinuses/Orbits: No acute finding. IMPRESSION: Unchanged left temporal lobe hematoma and rim of edema. No worrisome mass effect. Electronically Signed   By: Tiburcio Pea M.D.   On: 06/22/2022 06:24   MR BRAIN W WO CONTRAST  Result Date: 06/20/2022 CLINICAL DATA:  Hemorrhagic stroke EXAM: MRI HEAD WITHOUT AND WITH CONTRAST TECHNIQUE: Multiplanar, multiecho pulse sequences of the brain and surrounding structures were obtained without and with intravenous contrast. CONTRAST:  6mL GADAVIST GADOBUTROL 1 MMOL/ML IV SOLN COMPARISON:  No prior MRI, correlation is made with CT head 06/20/2022 FINDINGS: Brain: Redemonstrated hematoma centered in the left posterior temporal and lateral occipital region, which measures approximately min 6.3 x 3.3 x 3.0 cm (AP x TR x CC) (series 14, image 13 and series 21, image 12), with an approximate volume of 31 mL, likely unchanged compared to the prior exam when accounting for differences in technique. The hemorrhage is T1 isointense and hyperintense and primarily T2 hypointense, consistent with acute and early subacute blood products.  No abnormal enhancement within the hematoma to suggest active extravasation. Mild surrounding T2 hyperintense signal, consistent with edema, with mass effect on the left lateral ventricle atrium, temporal horn, and occipital horn, which demonstrate a small amount of intraventricular extension. A small amount of hemorrhage is also noted in the right occipital horn. No abnormal parenchymal or meningeal enhancement. No additional acute infarct, mass, or midline shift. The basal cisterns are patent. Partial empty sella. Normal craniocervical junction. Vascular: Normal arterial flow voids. Normal arterial and venous enhancement. Skull and upper cervical spine: Normal marrow signal. Sinuses/Orbits: Minimal mucosal thickening in the paranasal sinuses. Status post bilateral lens replacements. Other: The mastoids are well aerated. IMPRESSION: 1. Redemonstrated  hematoma centered in the left posterior temporal and lateral occipital region, with an approximate volume of 31 mL, likely unchanged compared to the prior CT when accounting for differences in technique. No abnormal enhancement to suggest active extravasation. 2. Mild surrounding edema with mass effect on the left lateral ventricle atrium, temporal horn, and occipital horn, with a small amount of left greater than right intraventricular extension. Electronically Signed   By: Wiliam Ke M.D.   On: 06/20/2022 19:57   ECHOCARDIOGRAM COMPLETE  Result Date: 06/20/2022    ECHOCARDIOGRAM REPORT   Patient Name:   Deborah Kerr Date of Exam: 06/20/2022 Medical Rec #:  [id number]       Height:       62.0 in Accession #:    [id number]      Weight:       122.0 lb Date of Birth:  18-Aug-1945       BSA:          1.549 m Patient Age:    76 years        BP:           160/92 mmHg Patient Gender: F               HR:           88 bpm. Exam Location:  Inpatient Procedure: 2D Echo, 3D Echo, Cardiac Doppler and Color Doppler Indications:    Stroke. ICH  History:        Patient has no prior history of Echocardiogram examinations.                 Abnormal ECG, Arrythmias:LBBB; Risk Factors:Hypertension and                 Sleep Apnea.  Sonographer:    Sheralyn Boatman RDCS Referring Phys: [id number] [facility] IMPRESSIONS  1. Left ventricular ejection fraction, by estimation, is 65 to 70%. Left ventricular ejection fraction by 3D volume is 70 %. The left ventricle has normal function. The left ventricle has no regional wall motion abnormalities. There is mild concentric left ventricular hypertrophy. Indeterminate diastolic filling due to E-A fusion.  2. Right ventricular systolic function is normal. The right ventricular size is normal. There is mildly elevated pulmonary artery systolic pressure. The estimated right ventricular systolic pressure is 40.7 mmHg.  3. The mitral valve is grossly normal. Trivial mitral valve regurgitation.  No evidence of mitral stenosis.  4. The aortic valve is tricuspid. Aortic valve regurgitation is not visualized. No aortic stenosis is present.  5. The inferior vena cava is normal in size with <50% respiratory variability, suggesting right atrial pressure of 8 mmHg. Conclusion(s)/Recommendation(s): No intracardiac source of embolism detected on this transthoracic study. Consider a transesophageal echocardiogram to exclude cardiac source of embolism if clinically indicated.  FINDINGS  Left Ventricle: Left ventricular ejection fraction, by estimation, is 65 to 70%. Left ventricular ejection fraction by 3D volume is 70 %. The left ventricle has normal function. The left ventricle has no regional wall motion abnormalities. The left ventricular internal cavity size was normal in size. There is mild concentric left ventricular hypertrophy. Indeterminate diastolic filling due to E-A fusion. Right Ventricle: The right ventricular size is normal. No increase in right ventricular wall thickness. Right ventricular systolic function is normal. There is mildly elevated pulmonary artery systolic pressure. The tricuspid regurgitant velocity is 2.86  m/s, and with an assumed right atrial pressure of 8 mmHg, the estimated right ventricular systolic pressure is 40.7 mmHg. Left Atrium: Left atrial size was normal in size. Right Atrium: Right atrial size was normal in size. Pericardium: There is no evidence of pericardial effusion. Presence of epicardial fat layer. Mitral Valve: The mitral valve is grossly normal. Mild mitral annular calcification. Trivial mitral valve regurgitation. No evidence of mitral valve stenosis. MV peak gradient, 11.4 mmHg. The mean mitral valve gradient is 5.0 mmHg. Tricuspid Valve: The tricuspid valve is grossly normal. Tricuspid valve regurgitation is mild . No evidence of tricuspid stenosis. Aortic Valve: The aortic valve is tricuspid. Aortic valve regurgitation is not visualized. No aortic stenosis is  present. Aortic valve mean gradient measures 7.0 mmHg. Aortic valve peak gradient measures 13.2 mmHg. Aortic valve area, by VTI measures 2.80  cm. Pulmonic Valve: The pulmonic valve was grossly normal. Pulmonic valve regurgitation is not visualized. No evidence of pulmonic stenosis. Aorta: The aortic root and ascending aorta are structurally normal, with no evidence of dilitation. Venous: The right lower pulmonary vein is normal. The inferior vena cava is normal in size with less than 50% respiratory variability, suggesting right atrial pressure of 8 mmHg. IAS/Shunts: The interatrial septum is aneurysmal. The atrial septum is grossly normal.  LEFT VENTRICLE PLAX 2D LVIDd:         3.50 cm         Diastology LVIDs:         2.00 cm         LV e' medial:   2.94 cm/s LV PW:         1.30 cm         LV E/e' medial: 40.8 LV IVS:        1.30 cm LVOT diam:     2.30 cm LV SV:         95              3D Volume EF LV SV Index:   61              LV 3D EF:    Left LVOT Area:     4.15 cm                     ventricul                                             ar                                             ejection LV Volumes (MOD)  fraction LV vol d, MOD    78.9 ml                    by 3D A2C:                                        volume is LV vol d, MOD    63.0 ml                    70 %. A4C: LV vol s, MOD    17.6 ml A2C:                           3D Volume EF: LV vol s, MOD    18.2 ml       3D EF:        70 % A4C:                           LV EDV:       117 ml LV SV MOD A2C:   61.3 ml       LV ESV:       34 ml LV SV MOD A4C:   63.0 ml       LV SV:        82 ml LV SV MOD BP:    52.9 ml RIGHT VENTRICLE             IVC RV S prime:     16.20 cm/s  IVC diam: 1.90 cm TAPSE (M-mode): 2.7 cm LEFT ATRIUM             Index        RIGHT ATRIUM           Index LA diam:        3.50 cm 2.26 cm/m   RA Area:     12.50 cm LA Vol (A2C):   40.7 ml 26.27 ml/m  RA Volume:   26.50 ml  17.10 ml/m LA Vol (A4C):   45.2  ml 29.17 ml/m LA Biplane Vol: 47.0 ml 30.33 ml/m  AORTIC VALVE AV Area (Vmax):    2.88 cm AV Area (Vmean):   2.84 cm AV Area (VTI):     2.80 cm AV Vmax:           182.00 cm/s AV Vmean:          126.000 cm/s AV VTI:            0.340 m AV Peak Grad:      13.2 mmHg AV Mean Grad:      7.0 mmHg LVOT Vmax:         126.00 cm/s LVOT Vmean:        86.200 cm/s LVOT VTI:          0.229 m LVOT/AV VTI ratio: 0.67  AORTA Ao Root diam: 2.80 cm Ao Asc diam:  3.40 cm MITRAL VALVE                TRICUSPID VALVE MV Area (PHT): 4.15 cm     TR Peak grad:   32.7 mmHg MV Area VTI:   2.75 cm     TR Vmax:        286.00 cm/s MV Peak grad:  11.4 mmHg MV Mean grad:  5.0 mmHg     SHUNTS MV Vmax:       1.69 m/s     Systemic VTI:  0.23 m MV Vmean:      107.0 cm/s   Systemic Diam: 2.30 cm MV Decel Time: 183 msec MV E velocity: 120.00 cm/s MV A velocity: 154.00 cm/s MV E/A ratio:  0.78 Lennie Odor MD Electronically signed by Lennie Odor MD Signature Date/Time: 06/20/2022/3:42:10 PM    Final    CT ANGIO HEAD NECK W WO CM  Result Date: 06/20/2022 CLINICAL DATA:  Hemorrhagic stroke EXAM: CT ANGIOGRAPHY HEAD AND NECK TECHNIQUE: Multidetector CT imaging of the head and neck was performed using the standard protocol during bolus administration of intravenous contrast. Multiplanar CT image reconstructions and MIPs were obtained to evaluate the vascular anatomy. Carotid stenosis measurements (when applicable) are obtained utilizing NASCET criteria, using the distal internal carotid diameter as the denominator. RADIATION DOSE REDUCTION: This exam was performed according to the departmental dose-optimization program which includes automated exposure control, adjustment of the mA and/or kV according to patient size and/or use of iterative reconstruction technique. CONTRAST:  75mL OMNIPAQUE IOHEXOL 350 MG/ML SOLN COMPARISON:  Same-day noncontrast CT head FINDINGS: CTA NECK FINDINGS Aortic arch: The imaged aortic arch is normal. The origins of  the major branch vessels are patent. The subclavian arteries are patent to the level imaged. Right carotid system: The right common, internal, and external carotid arteries are patent with mild plaque at the bifurcation but no hemodynamically significant stenosis or occlusion. There is no dissection or aneurysm. Left carotid system: The left common, internal, and external carotid arteries are patent, with minimal plaque at the bifurcation but no hemodynamically significant stenosis or occlusion. There is no dissection or aneurysm. Vertebral arteries: The vertebral arteries are patent, without hemodynamically significant stenosis or occlusion. There is no dissection or aneurysm. Skeleton: There is mild degenerative change of the cervical spine with disc space narrowing and degenerative endplate change C6-C7 and mild grade 1 anterolisthesis of C5 on C6. There is no acute osseous abnormality or suspicious osseous lesion. There is no visible canal hematoma. Other neck: Soft tissues of the neck are unremarkable. Upper chest: The imaged lung apices are clear. Review of the MIP images confirms the above findings CTA HEAD FINDINGS Anterior circulation: The intracranial ICAs are patent with minimal plaque. The bilateral MCAs are patent, without proximal stenosis or occlusion. The bilateral ACAs are patent, without proximal stenosis or occlusion. The anterior communicating artery is not definitely seen. There is no aneurysm or AVM. There is no spot sign to suggest active extravasation into the left temporal lobe hematoma. The hematoma appears grossly similar in size with similar regional mass effect. Posterior circulation: The bilateral V4 segments are patent. The basilar artery is patent. The major cerebellar arteries are patent. The bilateral PCAs are patent, without proximal stenosis or occlusion. There is no aneurysm or AVM. Venous sinuses: Patent. Anatomic variants: None. Review of the MIP images confirms the above  findings IMPRESSION: 1. Normal vasculature of the head and neck with no aneurysm or AVM. 2. No evidence of active extravasation into the left temporal lobe intraparenchymal hematoma. Electronically Signed   By: Lesia Hausen M.D.   On: 06/20/2022 13:38   [place] EKG SITE RITE  Result Date: 06/20/2022 If Site Rite image not attached, placement could not be confirmed due to current cardiac rhythm.  CT Head Wo Contrast  Addendum Date: 06/20/2022   ADDENDUM REPORT: 06/20/2022 09:09 ADDENDUM: Findings discussed with Dr.  Gray via telephone at 9:07 a.m. Electronically Signed   By: Feliberto Harts M.D.   On: 06/20/2022 09:09   Result Date: 06/20/2022 CLINICAL DATA:  Headache, new onset (Age >= 51y) sudden onset, aphasia/weakness/n/v EXAM: CT HEAD WITHOUT CONTRAST TECHNIQUE: Contiguous axial images were obtained from the base of the skull through the vertex without intravenous contrast. RADIATION DOSE REDUCTION: This exam was performed according to the departmental dose-optimization program which includes automated exposure control, adjustment of the mA and/or kV according to patient size and/or use of iterative reconstruction technique. COMPARISON:  None Available. FINDINGS: Brain: Acute intraparenchymal hemorrhage centered in left posterior temporal/occipital region, measuring up to approximately 5.5 x 3.8 x 2.4 cm (estimated volume of 25 mL). Mild surrounding edema. Regional mass effect. No midline shift. Small volume of intraventricular extension into the atrium of the left lateral ventricle. The lateral ventricle is effaced on the left. Borderline ventriculomegaly. No evidence of acute large vascular territory infarct or visible mass lesion. Partially empty sella. Vascular: No hyperdense vessel identified. Skull: No acute fracture. Sinuses/Orbits: Clear sinuses.  No acute orbital findings. Other: No mastoid effusions. IMPRESSION: 1. Acute intraparenchymal hemorrhage centered in left posterior temporal/occipital  region, measuring up to approximately 5.5 x 3.8 x 2.4 cm (estimated volume of 25 mL). Small volume of intraventricular extension into the atrium of the left lateral ventricle. 2. Borderline ventriculomegaly.  Recommend attention on follow-up. Electronically Signed: By: Feliberto Harts M.D. On: 06/20/2022 08:59      HISTORY OF PRESENT ILLNESS  Deborah Kerr is a 76 y.o. female with history of HTN, thyroid disease, arthritis, LBBB who states she was in her normal state, out shopping with husband on Sunday 12/17 and had sudden onset of headache, N/V. MRI shows a left posterior temporal and lateral occipital region hematoma.     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  Small IVH at left lateral ventricle. CTA head & neck- Normal vasculature of the head and neck with no aneurysm or AVM.  MRI  Redemonstrated ICH centered in the left posterior temporal and lateral occipital region, likely unchanged.  Mild surrounding edema with mass effect on the left lateral ventricle atrium, temporal horn, and occipital horn, with a small amount of left greater than right IVH. 12/21 and 12/22- CT Head x 2 - Unchanged left temporal lobe hematoma and rim of edema. No worrisome mass effect. 2D Echo EF 65-70% LDL 114 HgbA1c 5.6 No antithrombotic prior to admission, now on No antithrombotic due to ICH  Cerebral Edema with brain compression  She was placed on 3% hypertonic sale and has been tapered off. Repeat Head CT was stable and neurological exam is stable. Last sodium on day of discharge was 140.   Hypertensive Emergency She was started on Cleviprex infusion for a BP goal of < 160. She has been restarted on her home medications of Cozaar 100mg , HCTZ 12.5 mg. Her home metoprolol was increased from 50mg  to 100mg  daily.    Other Stroke Risk Factors Advanced Age >/= 65     Other Active Problems Hypothyroidism -home synthroid restarted  Arthritis Home meds- leflunomide  Hypokalemia  K 2.9 -> 2.3 ->2.7 ->3.1-supplement  Hypocalcemia Ca 6.5-6.2-6.2-> 6.2 supplement - po meds increased to TID     DISCHARGE EXAM Blood pressure 135/83, pulse 68, temperature 98.9 F (37.2 C), temperature source Oral, resp. rate 11, height 5'  3" (1.6 m), weight 61.1 kg, SpO2 97 %.  Temp:  [98.6 F (37 C)-99.3 F (37.4 C)] 98.9 F (37.2 C) (12/25 1150) Pulse Rate:  [68-99] 68 (12/25 1200) Resp:  [7-25] 11 (12/25 1200) BP: (95-182)/(67-167) 135/83 (12/25 1100) SpO2:  [91 %-99 %] 97 % (12/25 1200)  General - Well nourished, well developed, in no apparent distress. Cardiovascular - Regular rhythm and rate.  Mental Status -  Level of arousal and orientation to time, place, and person were intact. No aphasia but intermittent word finding difficulty with paraphasic errors, following all simple commands.  Attention span and concentration were normal. No gaze palsy, tracking bilaterally, visual field exam showed right upper quadrantanopia, PERRL. Slight right facial droop. Tongue midline.   Motor Strength - The patient's strength was normal in all extremities and pronator drift was absent.  Bulk was normal and fasciculations were absent.   Motor Tone - Muscle tone was assessed at the neck and appendages and was normal.  Sensory - Light touch, temperature/pinprick were assessed and were symmetrical.    Coordination - The patient had normal movements in the hands and feet with no ataxia or dysmetria.  Tremor was absent.  Gait and Station - deferred.  Discharge Diet       Diet   Diet Heart Room service appropriate? Yes; Fluid consistency: Thin   liquids  DISCHARGE PLAN Disposition:  Home Ongoing stroke risk factor control by Primary Care Physician at time of discharge Follow-up PCP Sasser, Clarene Critchley, MD in 2 weeks. Follow-up in [facility] in 8 weeks, office to schedule an appointment.  Referral for Outpatient Physical, Occupational and Speech therapy has been made  Patient was deemed safe and appropriate for discharge home. Family was present at bedside and all questions and concerns were answered by Dr. Roda Shutters. Family and Patient voiced understanding.  Patient and family aware that patient should call and make an appointment with PCP in 10-14 days after discharge.  Patient and family are aware that patient should call and make an appointment for 2 months with [facility].  Patient and family aware that referrals for outpatient PT, OT, and ST have been made   45 minutes were spent preparing discharge.  Gevena Mart DNP, ACNPC-AG  [facility]  ATTENDING NOTE: I reviewed above note and agree with the assessment and plan. Pt was seen and examined.   Wife and son at bedside.  Patient seen in chair, doing well, denies headache.  Still has mild nonfluent language and naming difficulty.  Otherwise neuro intact. Still has hypokalemia and hypocalcemia, will discuss with pharmacy to supplement. After that, pt will be medically ready for discharge. PT/OT recommend outpt therapy. Will arrange. Pt will follow up with [facility] in 4 weeks.  For detailed assessment and plan, please refer to above/below as I have made changes wherever appropriate.   Marvel Plan, MD PhD Stroke Neurology 06/26/2022 2:38 PM

## 2022-06-26 NOTE — Progress Notes (Signed)
Pt discharged, AVS given, and pt escorted to front hospital circle to family car with husband.

## 2022-06-27 LAB — CALCIUM, IONIZED: Calcium, Ionized, Serum: 3.6 mg/dL — ABNORMAL LOW (ref 4.5–5.6)

## 2022-07-26 DIAGNOSIS — M05719 Rheumatoid arthritis with rheumatoid factor of unspecified shoulder without organ or systems involvement: Secondary | ICD-10-CM | POA: Diagnosis not present

## 2022-07-26 DIAGNOSIS — E039 Hypothyroidism, unspecified: Secondary | ICD-10-CM | POA: Diagnosis not present

## 2022-07-26 DIAGNOSIS — I10 Essential (primary) hypertension: Secondary | ICD-10-CM | POA: Diagnosis not present

## 2022-07-26 DIAGNOSIS — I619 Nontraumatic intracerebral hemorrhage, unspecified: Secondary | ICD-10-CM | POA: Diagnosis not present

## 2022-07-26 DIAGNOSIS — Z6821 Body mass index (BMI) 21.0-21.9, adult: Secondary | ICD-10-CM | POA: Diagnosis not present

## 2022-07-27 NOTE — Progress Notes (Signed)
[facility] [address] [place]. [place] [address] [phone]       HOSPITAL FOLLOW UP NOTE  Ms. Levora Dredge Date of Birth:  December 04, 1945 Medical Record Number:  [id number]   Reason for Referral:  hospital stroke follow up    SUBJECTIVE:   CHIEF COMPLAINT:  Chief Complaint  Patient presents with   New Patient (Initial Visit)    Patient in room #3 with her husband. Pt here today to discuss hx stroke.    HPI:   Ms. EZMA REHM is a 77 y.o. female with history of HTN, thyroid disease, arthritis, LBBB who presented to ED on 06/20/2022 with sudden onset headache with N/V and hypertensive emergency.  CTH showed acute ICH centered at left posterior temporal/occipital region and small IVH at left lateral ventricle.  CTA head/neck normal vasculature without evidence of aneurysm or AVM.  MRI redemonstrated ICH centered at left posterior temporal and lateral occipital region, likely unchanged, mild surrounding edema with mass effect on the left lateral ventricle atrium, temporal horn and occipital horn with a small amount of left greater than right IVH.  Placed on 3% hypertonic saline.  Required Cleviprex for BP control and eventually transition to home medications of Cozaar and HCTZ, home dose metoprolol dosage increased to 100 mg daily.  Repeat CTH stable appearance without worrisome effect.  EF 60 to 65%.  LDL 114.  A1c 5.6.  Therapies recommended outpatient PT/OT/SLP.   Today, 07/31/2022, patient is being seen for initial hospital follow-up accompanied by her husband.  She reports residual cognitive deficits, occasional word finding difficulty and some right sided peripheral vision impairment. Denies any residual weakness or gait difficulty.  Scheduled to start PT and SLP this week. She continues to have almost daily left frontal headaches, same as what she was experiencing in hospital, will resolve after use of Tylenol which she has been taking daily.   Blood  pressure today 157/89, monitors at home, can fluctuate, does keep log, does not have today nor can remember what her levels typically run  Has seen PCP since d/c, has f/u next month     PERTINENT IMAGING  Per hospitalization 06/20/2022 -06/26/2022 Code Stroke CT head - Acute ICH centered in left posterior temporal/occipital region.  Small IVH at left lateral ventricle. CTA head & neck- Normal vasculature of the head and neck with no aneurysm or AVM.  MRI  Redemonstrated ICH centered in the left posterior temporal and lateral occipital region, likely unchanged.  Mild surrounding edema with mass effect on the left lateral ventricle atrium, temporal horn, and occipital horn, with a small amount of left greater than right IVH. 12/21 and 12/22- CT Head x 2 - Unchanged left temporal lobe hematoma and rim of edema. No worrisome mass effect. 2D Echo EF 65-70% LDL 114 HgbA1c 5.6    ROS:   14 system review of systems performed and negative with exception of those listed in HPI  PMH:  Past Medical History:  Diagnosis Date   Arthritis    rheumatoid arthritis   Hypertension    Hypothyroidism 2009   Left bundle branch block    Thyroid disease     PSH:  Past Surgical History:  Procedure Laterality Date   APPENDECTOMY     per patient 1970s   COLONOSCOPY     COLONOSCOPY N/A 05/18/2015   Procedure: COLONOSCOPY;  Surgeon: Corbin Ade, MD;  [facility];  Service: Endoscopy;  Laterality: N/A;  11:30   EYE  SURGERY Bilateral 2021   cataract surgery   HARDWARE REMOVAL Left 12/22/2020   Procedure: HARDWARE REMOVAL LEFT ANKLE;  Surgeon: Shona Needles, MD;  Location: [facility];  Service: Orthopedics;  Laterality: Left;   LUMBAR LAMINECTOMY/DECOMPRESSION MICRODISCECTOMY N/A 07/21/2021   Procedure: Microlumbar decompression Lumbar Four-Five and Lumbar Five - Sacral One, excision of synovial cyst;  Surgeon: Susa Day, MD;  Location: [place];  Service: Orthopedics;  Laterality: N/A;    OOPHORECTOMY     ORIF ANKLE FRACTURE Left 06/24/2020   Procedure: OPEN REDUCTION INTERNAL FIXATION (ORIF) ANKLE FRACTURE;  Surgeon: Shona Needles, MD;  Location: [facility] [place];  Service: Orthopedics;  Laterality: Left;   Thyroidectomy  2006   TUBAL LIGATION      Social History:  Social History   Socioeconomic History   Marital status: Married    Spouse name: Not on file   Number of children: Not on file   Years of education: Not on file   Highest education level: Not on file  Occupational History   Not on file  Tobacco Use   Smoking status: Never   Smokeless tobacco: Never  Vaping Use   Vaping Use: Never used  Substance and Sexual Activity   Alcohol use: No   Drug use: No   Sexual activity: Not Currently  Other Topics Concern   Not on file  Social History Narrative   Not on file   Social Determinants of Health   Financial Resource Strain: Not on file  Food Insecurity: Not on file  Transportation Needs: Not on file  Physical Activity: Not on file  Stress: Not on file  Social Connections: Not on file  Intimate Partner Violence: Not At Risk (06/21/2022)   Humiliation, Afraid, Rape, and Kick questionnaire    Fear of Current or Ex-Partner: No    Emotionally Abused: No    Physically Abused: No    Sexually Abused: No    Family History: History reviewed. No pertinent family history.  Medications:   Current Outpatient Medications on File Prior to Visit  Medication Sig Dispense Refill   acetaminophen (TYLENOL) 500 MG tablet Take 1,000 mg by mouth every 6 (six) hours as needed for moderate pain or headache.     calcium carbonate (OS-CAL - DOSED IN MG OF ELEMENTAL CALCIUM) 1250 (500 Ca) MG tablet Take 1 tablet (1,250 mg total) by mouth 3 (three) times daily with meals. 30 tablet 0   hydrochlorothiazide (HYDRODIURIL) 12.5 MG tablet Take 12.5 mg by mouth daily.     hydroxychloroquine (PLAQUENIL) 200 MG tablet Take 200 mg by mouth daily with lunch.     leflunomide (ARAVA) 20 MG  tablet Take 20 mg by mouth daily.     levothyroxine (SYNTHROID) 125 MCG tablet Take 125 mcg by mouth daily before breakfast.     losartan (COZAAR) 100 MG tablet Take 100 mg by mouth daily.     metoprolol succinate (TOPROL-XL) 100 MG 24 hr tablet Take 1 tablet (100 mg total) by mouth daily. Take with or immediately following a meal. 30 tablet 0   polyethylene glycol (MIRALAX / GLYCOLAX) 17 g packet Take 17 g by mouth daily. 14 each 0   potassium chloride SA (KLOR-CON) 20 MEQ tablet Take 20 mEq by mouth daily.     No current facility-administered medications on file prior to visit.    Allergies:   Allergies  Allergen Reactions   Orencia [Abatacept] Itching and Cough    Palms itch      OBJECTIVE:  Physical Exam  Vitals:   07/31/22 0950  BP: (!) 157/89  Pulse: 76  Weight: 120 lb (54.4 kg)  Height: 5\' 2"  (1.575 m)   Body mass index is 21.95 kg/m. No results found.  General: well developed, well nourished, very pleasant elderly Caucasian female, seated, in no evident distress Head: head normocephalic and atraumatic.   Neck: supple with no carotid or supraclavicular bruits Cardiovascular: regular rate and rhythm, no murmurs Musculoskeletal: no deformity Skin:  no rash/petichiae Vascular:  Normal pulses all extremities   Neurologic Exam Mental Status: Awake and fully alert. Some difficulty naming objects, fluent with conversation, some difficulty with reading.  Follows commands without difficulty.  Oriented to place and time. Recent memory mildly impaired and remote memory intact. Attention span, concentration and fund of knowledge appropriate during visit. Mood and affect appropriate. Serial additional good. Recall 0/3 - with promt 2/3. Clock drawing 4/4. 4 legged animal naming 8 in 60 seconds.  Cranial Nerves: Fundoscopic exam reveals sharp disc margins. Pupils equal, briskly reactive to light. Extraocular movements full without nystagmus. Visual fields full to confrontation  although subjective slight blurriness in right periphery. Hearing intact. Facial sensation intact. Face, tongue, palate moves normally and symmetrically.  Motor: Normal bulk and tone. Normal strength in all tested extremity muscles Sensory.: intact to touch , pinprick , position and vibratory sensation.  Coordination: Rapid alternating movements normal in all extremities. Finger-to-nose and heel-to-shin performed accurately bilaterally. Gait and Station: Arises from chair without difficulty. Stance is normal. Gait demonstrates normal stride length and balance without use of AD.  Reflexes: 1+ and symmetric. Toes downgoing.     NIHSS  1 Modified Rankin  2      ASSESSMENT: IRIANA ARTLEY is a 77 y.o. 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 Vascular risk factors include HTN and advanced age.      PLAN:  L temporal/occipital ICH with IVH:  Residual deficit: Mild cognitive impairment, mild aphasia and subjective right peripheral visual impairment.  Start SLP as scheduled this week.  Recommend follow-up with her ophthalmologist for peripheral visual field exam.  Discussed typical recovery time. No indication for antithrombotic in setting of recent ICH and no prior stroke history.   Recommend initiating Crestor 10 mg daily, will plan on repeat lipid panel at f/u visit if not already completed by PCP Discussed secondary stroke prevention measures and importance of close PCP follow up for aggressive stroke risk factor management including BP goal<130/90, HLD with LDL goal<70 and DM with A1c.<7 .  Stroke labs 06/2022: LDL 114, A1c 5.6 I have gone over the pathophysiology of stroke, warning signs and symptoms, risk factors and their management in some detail with instructions to go to the closest emergency room for symptoms of concern. Vascular headache: Post ICH.  Unchanged since hospitalization.  Recommend initiating  topiramate 25 mg twice daily.  Discussed potential side effects.  Advised to call after 1 week if no benefit or sooner if difficulty tolerating.  Discussed limiting use of Tylenol 2-3 times per week as she is likely also experiencing analgesic rebound    Follow up in 3-4 months or call earlier if needed   CC:  [facility] provider: Dr. 07/2022 PCP: Pearlean Brownie, MD    I spent 59 minutes of face-to-face and non-face-to-face time with patient and husband.  This included previsit chart review including review of recent hospitalization, lab review, study review, order entry, electronic health record documentation, patient  and husband education regarding recent stroke including etiology, secondary stroke prevention measures and importance of managing stroke risk factors, residual deficits and typical recovery time and answered all other questions to patient and husband's satisfaction   Frann Rider, AGNP-BC  [facility] [address] [place], [place] [address]  Phone [phone] Fax [phone] Note: This document was prepared with digital dictation and possible smart phrase technology. Any transcriptional errors that result from this process are unintentional.

## 2022-07-29 NOTE — Therapy (Incomplete)
OUTPATIENT PHYSICAL THERAPY NEURO EVALUATION   Patient Name: Deborah Kerr MRN: [id number] DOB:1946-01-24, 77 y.o., female Today's Date: 07/29/2022   PCP: Marland Kitchen REFERRING PROVIDER: ***  END OF SESSION:   Past Medical History:  Diagnosis Date   Arthritis    rheumatoid arthritis   Hypertension    Hypothyroidism 2009   Left bundle branch block    Thyroid disease    Past Surgical History:  Procedure Laterality Date   APPENDECTOMY     per patient 1970s   COLONOSCOPY     COLONOSCOPY N/A 05/18/2015   Procedure: COLONOSCOPY;  Surgeon: Daneil Dolin, MD;  [facility];  Service: Endoscopy;  Laterality: N/A;  11:30   EYE SURGERY Bilateral 2021   cataract surgery   HARDWARE REMOVAL Left 12/22/2020   Procedure: HARDWARE REMOVAL LEFT ANKLE;  Surgeon: Shona Needles, MD;  Location: [facility];  Service: Orthopedics;  Laterality: Left;   LUMBAR LAMINECTOMY/DECOMPRESSION MICRODISCECTOMY N/A 07/21/2021   Procedure: Microlumbar decompression Lumbar Four-Five and Lumbar Five - Sacral One, excision of synovial cyst;  Surgeon: Susa Day, MD;  Location: [facility];  Service: Orthopedics;  Laterality: N/A;   OOPHORECTOMY     ORIF ANKLE FRACTURE Left 06/24/2020   Procedure: OPEN REDUCTION INTERNAL FIXATION (ORIF) ANKLE FRACTURE;  Surgeon: Shona Needles, MD;  Location: [facility];  Service: Orthopedics;  Laterality: Left;   Thyroidectomy  2006   TUBAL LIGATION     Patient Active Problem List   Diagnosis Date Noted   Cerebral edema ([place]) 06/26/2022   Compression of brain ([place]) 06/26/2022   Intractable headache 06/26/2022   HTN (hypertension) 06/26/2022   Hypokalemia 06/26/2022   Hypocalcemia 06/26/2022   Hypothyroidism 06/26/2022   Intraparenchymal hemorrhage of brain ([place]) 06/20/2022   ICH (intracerebral hemorrhage) ([place]) 06/20/2022   Spinal stenosis at L4-L5 level 07/21/2021   Spinal stenosis of lumbar region 02/17/2021   Closed trimalleolar fracture of left ankle 06/22/2020    Special screening for malignant neoplasms, colon    Diverticulosis of colon without hemorrhage     ONSET DATE: ***  REFERRING DIAG: ***  THERAPY DIAG:  No diagnosis found.  Rationale for Evaluation and Treatment: {HABREHAB:27488}  SUBJECTIVE:                                                                                                                                                                                             SUBJECTIVE STATEMENT: *** Pt accompanied by: {accompnied:27141}  PERTINENT HISTORY: ***  PAIN:  Are you having pain? {OPRCPAIN:27236}  PRECAUTIONS: {Therapy precautions:24002}  WEIGHT BEARING RESTRICTIONS: {Yes ***/No:24003}  FALLS: Has patient fallen in  last 6 months? {fallsyesno:27318}  LIVING ENVIRONMENT: Lives with: {OPRC lives with:25569::"lives with their family"} Lives in: {Lives in:25570} Stairs: {opstairs:27293} Has following equipment at home: {Assistive devices:23999}  PLOF: {PLOF:24004}  PATIENT GOALS: ***  OBJECTIVE:   DIAGNOSTIC FINDINGS: ***  COGNITION: Overall cognitive status: {cognition:24006}   SENSATION: {sensation:27233}  COORDINATION: ***  EDEMA:  {edema:24020}  MUSCLE TONE: {LE tone:25568}  MUSCLE LENGTH: Hamstrings: Right *** deg; Left *** deg Thomas test: Right *** deg; Left *** deg  DTRs:  {DTR SITE:24025}  POSTURE: {posture:25561}  LOWER EXTREMITY ROM:     {AROM/PROM:27142}  Right Eval Left Eval  Hip flexion    Hip extension    Hip abduction    Hip adduction    Hip internal rotation    Hip external rotation    Knee flexion    Knee extension    Ankle dorsiflexion    Ankle plantarflexion    Ankle inversion    Ankle eversion     (Blank rows = not tested)  LOWER EXTREMITY MMT:    MMT Right Eval Left Eval  Hip flexion    Hip extension    Hip abduction    Hip adduction    Hip internal rotation    Hip external rotation    Knee flexion    Knee extension    Ankle dorsiflexion     Ankle plantarflexion    Ankle inversion    Ankle eversion    (Blank rows = not tested)  BED MOBILITY:  {Bed mobility:24027}  TRANSFERS: Assistive device utilized: {Assistive devices:23999}  Sit to stand: {Levels of assistance:24026} Stand to sit: {Levels of assistance:24026} Chair to chair: {Levels of assistance:24026} Floor: {Levels of assistance:24026}  RAMP:  Level of Assistance: {Levels of assistance:24026} Assistive device utilized: {Assistive devices:23999} Ramp Comments: ***  CURB:  Level of Assistance: {Levels of assistance:24026} Assistive device utilized: {Assistive devices:23999} Curb Comments: ***  STAIRS: Level of Assistance: {Levels of assistance:24026} Stair Negotiation Technique: {Stair Technique:27161} with {Rail Assistance:27162} Number of Stairs: ***  Height of Stairs: ***  Comments: ***  GAIT: Gait pattern: {gait characteristics:25376} Distance walked: *** Assistive device utilized: {Assistive devices:23999} Level of assistance: {Levels of assistance:24026} Comments: ***  FUNCTIONAL TESTS:  {Functional tests:24029}  PATIENT SURVEYS:  {rehab surveys:24030}  TODAY'S TREATMENT:                                                                                                                              DATE: ***    PATIENT EDUCATION: Education details: *** Person educated: {Person educated:25204} Education method: {Education Method:25205} Education comprehension: {Education Comprehension:25206}  HOME EXERCISE PROGRAM: ***  GOALS: Goals reviewed with patient? {yes/no:20286}  SHORT TERM GOALS: Target date: ***  *** Baseline: Goal status: {GOALSTATUS:25110}  2.  *** Baseline:  Goal status: {GOALSTATUS:25110}  3.  *** Baseline:  Goal status: {GOALSTATUS:25110}  4.  *** Baseline:  Goal status: {GOALSTATUS:25110}  5.  *** Baseline:  Goal status: {GOALSTATUS:25110}  6.  ***  Baseline:  Goal status:  {GOALSTATUS:25110}  LONG TERM GOALS: Target date: ***  *** Baseline:  Goal status: {GOALSTATUS:25110}  2.  *** Baseline:  Goal status: {GOALSTATUS:25110}  3.  *** Baseline:  Goal status: {GOALSTATUS:25110}  4.  *** Baseline:  Goal status: {GOALSTATUS:25110}  5.  *** Baseline:  Goal status: {GOALSTATUS:25110}  6.  *** Baseline:  Goal status: {GOALSTATUS:25110}  ASSESSMENT:  CLINICAL IMPRESSION: Patient is a *** y.o. *** who was seen today for physical therapy evaluation and treatment for ***.   OBJECTIVE IMPAIRMENTS: {opptimpairments:25111}.   ACTIVITY LIMITATIONS: {activitylimitations:27494}  PARTICIPATION LIMITATIONS: {participationrestrictions:25113}  PERSONAL FACTORS: {Personal factors:25162} are also affecting patient's functional outcome.   REHAB POTENTIAL: {rehabpotential:25112}  CLINICAL DECISION MAKING: {clinical decision making:25114}  EVALUATION COMPLEXITY: {Evaluation complexity:25115}  PLAN:  PT FREQUENCY: {rehab frequency:25116}  PT DURATION: {rehab duration:25117}  PLANNED INTERVENTIONS: {rehab planned interventions:25118::"Therapeutic exercises","Therapeutic activity","Neuromuscular re-education","Balance training","Gait training","Patient/Family education","Self Care","Joint mobilization"}  PLAN FOR NEXT SESSION: ***   Marisue Brooklyn, PT 07/29/2022, 11:34 AM

## 2022-07-31 ENCOUNTER — Encounter: Payer: Self-pay | Admitting: Adult Health

## 2022-07-31 ENCOUNTER — Ambulatory Visit: Payer: Medicare PPO | Admitting: Adult Health

## 2022-07-31 VITALS — BP 157/89 | HR 76 | Ht 62.0 in | Wt 120.0 lb

## 2022-07-31 DIAGNOSIS — I619 Nontraumatic intracerebral hemorrhage, unspecified: Secondary | ICD-10-CM

## 2022-07-31 MED ORDER — TOPIRAMATE 25 MG PO TABS: 25.0000 mg | ORAL_TABLET | Freq: Two times a day (BID) | ORAL | 3 refills | Status: DC

## 2022-07-31 MED ORDER — ROSUVASTATIN CALCIUM 10 MG PO TABS: 10.0000 mg | ORAL_TABLET | Freq: Every day | ORAL | 5 refills | Status: DC

## 2022-07-31 NOTE — Patient Instructions (Signed)
Start therapies as scheduled this week  Recommend follow up with your eye doctor for peripheral vision field test, please have these reports sent to our office for review  Recommend starting topamax 25mg  twice daily for headaches - please call after 1 week if no benefit or sooner if difficulty tolerating  Please limit use of tylenol to 2-3 times per week as frequent use can cause rebound headaches  Recommend starting Crestor 10mg  daily for secondary stroke prevention  Continue to follow up with PCP regarding blood pressure and cholesterol management  Maintain strict control of hypertension with blood pressure goal below 130/90 and cholesterol with LDL cholesterol (bad cholesterol) goal below 70 mg/dL.   Signs of a Stroke? Follow the BEFAST method:  Balance Watch for a sudden loss of balance, trouble with coordination or vertigo Eyes Is there a sudden loss of vision in one or both eyes? Or double vision?  Face: Ask the person to smile. Does one side of the face droop or is it numb?  Arms: Ask the person to raise both arms. Does one arm drift downward? Is there weakness or numbness of a leg? Speech: Ask the person to repeat a simple phrase. Does the speech sound slurred/strange? Is the person confused ? Time: If you observe any of these signs, call 911.     Followup in the future in 3 months or call earlier if needed     Thank you for coming to see [place] at [facility]. I hope we have been able to provide you high quality care today.  You may receive a patient satisfaction survey over the next few weeks. We would appreciate your feedback and comments so that we may continue to improve ourselves and the health of our patients.

## 2022-08-01 ENCOUNTER — Ambulatory Visit (HOSPITAL_COMMUNITY): Payer: Medicare PPO | Attending: Nurse Practitioner

## 2022-08-01 DIAGNOSIS — R531 Weakness: Secondary | ICD-10-CM | POA: Diagnosis not present

## 2022-08-01 DIAGNOSIS — R262 Difficulty in walking, not elsewhere classified: Secondary | ICD-10-CM | POA: Insufficient documentation

## 2022-08-01 DIAGNOSIS — I619 Nontraumatic intracerebral hemorrhage, unspecified: Secondary | ICD-10-CM | POA: Diagnosis not present

## 2022-08-01 DIAGNOSIS — R41841 Cognitive communication deficit: Secondary | ICD-10-CM | POA: Insufficient documentation

## 2022-08-01 NOTE — Therapy (Signed)
OUTPATIENT PHYSICAL THERAPY NEURO EVALUATION   Patient Name: Deborah Kerr MRN: [id number] DOB:1946-01-20, 77 y.o., female Today's Date: 08/01/2022   PCP: Manon Hilding, MD  REFERRING PROVIDER: August Albino, NP  END OF SESSION:  PT End of Session - 08/01/22 1025     Visit Number 1    Number of Visits 8    Date for PT Re-Evaluation 08/29/22    Authorization Type [employer] Medicare (auth required -, no visit limits)    Progress Note Due on Visit 10    PT Start Time 1030    PT Stop Time 1115    PT Time Calculation (min) 45 min    Activity Tolerance Patient limited by fatigue    Behavior During Therapy [facility] for tasks assessed/performed             Past Medical History:  Diagnosis Date   Arthritis    rheumatoid arthritis   Hypertension    Hypothyroidism 2009   Left bundle branch block    Thyroid disease    Past Surgical History:  Procedure Laterality Date   APPENDECTOMY     per patient 1970s   COLONOSCOPY     COLONOSCOPY N/A 05/18/2015   Procedure: COLONOSCOPY;  Surgeon: Daneil Dolin, MD;  [facility];  Service: Endoscopy;  Laterality: N/A;  11:30   EYE SURGERY Bilateral 2021   cataract surgery   HARDWARE REMOVAL Left 12/22/2020   Procedure: HARDWARE REMOVAL LEFT ANKLE;  Surgeon: Shona Needles, MD;  Location: [place];  Service: Orthopedics;  Laterality: Left;   LUMBAR LAMINECTOMY/DECOMPRESSION MICRODISCECTOMY N/A 07/21/2021   Procedure: Microlumbar decompression Lumbar Four-Five and Lumbar Five - Sacral One, excision of synovial cyst;  Surgeon: Susa Day, MD;  Location: [place];  Service: Orthopedics;  Laterality: N/A;   OOPHORECTOMY     ORIF ANKLE FRACTURE Left 06/24/2020   Procedure: OPEN REDUCTION INTERNAL FIXATION (ORIF) ANKLE FRACTURE;  Surgeon: Shona Needles, MD;  Location: [place];  Service: Orthopedics;  Laterality: Left;   Thyroidectomy  2006   TUBAL LIGATION     Patient Active Problem List   Diagnosis Date Noted   Cerebral edema  ([place]) 06/26/2022   Compression of brain ([place]) 06/26/2022   Intractable headache 06/26/2022   HTN (hypertension) 06/26/2022   Hypokalemia 06/26/2022   Hypocalcemia 06/26/2022   Hypothyroidism 06/26/2022   Intraparenchymal hemorrhage of brain ([place]) 06/20/2022   ICH (intracerebral hemorrhage) ([place]) 06/20/2022   Spinal stenosis at L4-L5 level 07/21/2021   Spinal stenosis of lumbar region 02/17/2021   Closed trimalleolar fracture of left ankle 06/22/2020   Special screening for malignant neoplasms, colon    Diverticulosis of colon without hemorrhage     ONSET DATE: 06/20/22  REFERRING DIAG: I61.9 (ICD-10-CM) - Intraparenchymal hemorrhage of brain (HCC) R53.1 (ICD-10-CM) - Weakness  THERAPY DIAG:  Difficulty walking - Plan: PT plan of care cert/re-cert  Rationale for Evaluation and Treatment: Rehabilitation  SUBJECTIVE:  SUBJECTIVE STATEMENT: Patient has difficulty remembering things. Patient reports that her legs get tired easily especially when she has to go outside and do some grocery shopping. However, patient reports that the push carts are helping her when she gets her grocery. Patient states that she is slow and takes time when she walks. Patient denies any numbness on the legs. However, patient states that the L shin can be "tingly" at times and attributes it to the hx of ankle fx. Condition started on 06/20/22 when she had a bad HA and episodes of vomiting. Patient then went to the ER in [place] but was then taken to the hospital in [place]. Several imaging was done which revealed intraparenchymal hemorrhage of the brain. Patient stayed in the hospital for 3 days. Patient remembers that she received PT in the hospital where she walked around the hallway. No rehab done upon hospital D/C. Patient  then went to the neurologist as an outpatient and was then referred to PT evaluation and management. Patient was also referred to OT and SLP.  Pt accompanied by: self  PERTINENT HISTORY: HTN, lumbar stenosis, hx of L ankle fx  PAIN:  Are you having pain? No  PRECAUTIONS: Other: HTN  WEIGHT BEARING RESTRICTIONS: No  FALLS: Has patient fallen in last 6 months? No  LIVING ENVIRONMENT: Lives with: lives with their spouse Lives in: House/apartment Stairs: Yes: External: 2 steps; on right going up Has following equipment at home: Single point cane and Walker - 4 wheeled  PLOF: Independent  PATIENT GOALS: "to help me move better"  OBJECTIVE:   DIAGNOSTIC FINDINGS:  MRI head 06/20/22 IMPRESSION: 1. Redemonstrated hematoma centered in the left posterior temporal and lateral occipital region, with an approximate volume of 31 mL, likely unchanged compared to the prior CT when accounting for differences in technique. No abnormal enhancement to suggest active extravasation. 2. Mild surrounding edema with mass effect on the left lateral ventricle atrium, temporal horn, and occipital horn, with a small amount of left greater than right intraventricular extension.  COGNITION: Overall cognitive status: Within functional limits for tasks assessed   SENSATION: WFL  COORDINATION: No difficulty on heel-to-shin, alternating foot taps  MUSCLE TONE: B LE exhibit normal tone  MUSCLE LENGTH: Mild tightness on B hamstrings Moderate tightness on B gastrocsoleus   POSTURE: rounded shoulders, forward head, and decreased lumbar lordosis  LOWER EXTREMITY ROM:     Active  Right Eval Left Eval  Hip flexion [facility]  Hip extension    Hip abduction [facility]  Hip adduction    Hip internal rotation    Hip external rotation    Knee flexion [facility]  Knee extension [facility]  Ankle dorsiflexion [facility] WFL  Ankle plantarflexion [facility] WFL  Ankle inversion    Ankle eversion     (Blank rows = not  tested)  LOWER EXTREMITY MMT:    MMT Right Eval Left Eval  Hip flexion 4- 4  Hip extension    Hip abduction 4 4  Hip adduction    Hip internal rotation    Hip external rotation    Knee flexion 4- 4-  Knee extension 4- 4-  Ankle dorsiflexion 4+ 4+  Ankle plantarflexion 4+ 4+  Ankle inversion    Ankle eversion    (Blank rows = not tested)   TRANSFERS: Assistive device utilized: None  Sit to stand: Complete Independence Stand to sit: Complete Independence  GAIT: Gait pattern: step through pattern, decreased arm swing- Right, decreased arm swing-  Left, decreased stride length, decreased hip/knee flexion- Right, decreased hip/knee flexion- Left, decreased ankle dorsiflexion- Right, decreased ankle dorsiflexion- Left, trunk flexed, and narrow BOS Distance walked: 327 Assistive device utilized: None Level of assistance: Complete Independence   FUNCTIONAL TESTS:  Timed up and go (TUG): 8.50 2 minute walk test: 327 ft.  Rhomberg's test: slight to mild unsteadiness  Good static/dynamic standing balance  TODAY'S TREATMENT:                                                                                                                              DATE:  08/01/22 Evaluation and patient education Standing on a counter/table, 2 x 10:  Mini squats x 6" hold  Heel raises  Hip abd Seated: Gastrocsoleus stretch with a strap x 30" x 3    PATIENT EDUCATION: Education details: Educated on the pathoanatomy of ICH. Educated on the goals and course of rehab. Written HEP provided and reviewed Person educated: Patient Education method: Explanation and Demonstration Education comprehension: verbalized understanding and returned demonstration  HOME EXERCISE PROGRAM: Access Code: [email or web address] URL: [email or web address] Date: 08/01/2022 Prepared by: Rexene Alberts  Exercises - Seated Calf Stretch with Strap  - 1-2 x daily - 7 x weekly - 3 reps - 30 hold - Mini Squat with  Counter Support  - 1-2 x daily - 7 x weekly - 2 sets - 10 reps - 6 hold - Heel Raises with Counter Support  - 1-2 x daily - 7 x weekly - 2 sets - 10 reps - Standing Hip Abduction with Counter Support  - 1-2 x daily - 7 x weekly - 2 sets - 10 reps  GOALS: Goals reviewed with patient? Yes  SHORT TERM GOALS: Target date: 08/15/22  Ptwill demonstrate indep in HEP to facilitate carry-over of skilled services and improve functional outcomes Goal status: INITIAL   LONG TERM GOALS: Target date: 08/29/22  Pt will increase 2MWT by at least 40 ft in order to demonstrate clinically significant improvement in community ambulation Baseline: 327 ft Goal status: INITIAL  2.  Pt will have a decrease in TUG score by at least 3 sec in order to demonstrate clinically significant improvement in community ambulation Baseline: 8.9 sec Goal status: INITIAL  3.  Pt will demonstrate increase in LE strength to 4+ to facilitate ease and safety in ambulation Baseline: 4- Goal status: INITIAL  ASSESSMENT:  CLINICAL IMPRESSION: Patient is a 77 y.o. female who was seen today for physical therapy evaluation and treatment for difficulty with walking. Patient was diagnosed with intraparenchymal hemorrhage of the brain by referring provider further defined by difficulty with grocery shopping and walking due to LE weakness and impaired proprioception. Skilled PT is required to address the impairments and functional limitations listed below. Patient will benefit from LE strengthening, balance and gait exercises.  OBJECTIVE IMPAIRMENTS: Abnormal gait, decreased balance, difficulty walking, decreased strength, and impaired flexibility.   ACTIVITY LIMITATIONS:  ambulation  PARTICIPATION LIMITATIONS: driving,  shopping, and community activity  PERSONAL FACTORS: Age are also affecting patient's functional outcome.   REHAB POTENTIAL: Excellent  CLINICAL DECISION MAKING: Stable/uncomplicated  EVALUATION COMPLEXITY:  Low  PLAN:  PT FREQUENCY: 2x/week  PT DURATION: 4 weeks  PLANNED INTERVENTIONS: Therapeutic exercises, Therapeutic activity, Neuromuscular re-education, Balance training, Gait training, Patient/Family education, Self Care, and Joint mobilization  PLAN FOR NEXT SESSION: May add more LE strengthening and initiate proprioception/balance exercises.   Tish Frederickson. Korinne Greenstein, PT, DPT, OCS Board-Certified Clinical Specialist in Orthopedic PT PT Compact Privilege # ([place]): [id number] T 08/01/2022, 11:43 AM

## 2022-08-01 NOTE — Telephone Encounter (Signed)
Any restrictions on driving?

## 2022-08-02 ENCOUNTER — Encounter (HOSPITAL_COMMUNITY): Payer: Self-pay | Admitting: Speech Pathology

## 2022-08-02 ENCOUNTER — Ambulatory Visit (HOSPITAL_COMMUNITY): Payer: Medicare PPO | Admitting: Speech Pathology

## 2022-08-02 DIAGNOSIS — R262 Difficulty in walking, not elsewhere classified: Secondary | ICD-10-CM | POA: Diagnosis not present

## 2022-08-02 DIAGNOSIS — R41841 Cognitive communication deficit: Secondary | ICD-10-CM

## 2022-08-02 NOTE — Progress Notes (Signed)
I agree with the above plan 

## 2022-08-02 NOTE — Therapy (Signed)
OUTPATIENT SPEECH LANGUAGE PATHOLOGY EVALUATION   Patient Name: HIILEI GERST MRN: [id number] DOB:19-Oct-1945, 77 y.o., female Today's Date: 08/02/2022  PCP: Manon Hilding, MD REFERRING PROVIDER: August Albino, NP  END OF SESSION:  End of Session - 08/02/22 1227     Visit Number 1    Number of Visits 7    Date for SLP Re-Evaluation 09/07/22    Authorization Type [employer] Medicare PPO   OOP max, 4000, no visit limit, requires Ivor Costa copay   SLP Start Time 0950    SLP Stop Time  1035    SLP Time Calculation (min) 45 min    Activity Tolerance Patient tolerated treatment well             Past Medical History:  Diagnosis Date   Arthritis    rheumatoid arthritis   Hypertension    Hypothyroidism 2009   Left bundle branch block    Thyroid disease    Past Surgical History:  Procedure Laterality Date   APPENDECTOMY     per patient 1970s   COLONOSCOPY     COLONOSCOPY N/A 05/18/2015   Procedure: COLONOSCOPY;  Surgeon: Daneil Dolin, MD;  [facility];  Service: Endoscopy;  Laterality: N/A;  11:30   EYE SURGERY Bilateral 2021   cataract surgery   HARDWARE REMOVAL Left 12/22/2020   Procedure: HARDWARE REMOVAL LEFT ANKLE;  Surgeon: Shona Needles, MD;  Location: [place];  Service: Orthopedics;  Laterality: Left;   LUMBAR LAMINECTOMY/DECOMPRESSION MICRODISCECTOMY N/A 07/21/2021   Procedure: Microlumbar decompression Lumbar Four-Five and Lumbar Five - Sacral One, excision of synovial cyst;  Surgeon: Susa Day, MD;  Location: [place];  Service: Orthopedics;  Laterality: N/A;   OOPHORECTOMY     ORIF ANKLE FRACTURE Left 06/24/2020   Procedure: OPEN REDUCTION INTERNAL FIXATION (ORIF) ANKLE FRACTURE;  Surgeon: Shona Needles, MD;  Location: [place];  Service: Orthopedics;  Laterality: Left;   Thyroidectomy  2006   TUBAL LIGATION     Patient Active Problem List   Diagnosis Date Noted   Cerebral edema ([place]) 06/26/2022   Compression of brain ([place]) 06/26/2022    Intractable headache 06/26/2022   HTN (hypertension) 06/26/2022   Hypokalemia 06/26/2022   Hypocalcemia 06/26/2022   Hypothyroidism 06/26/2022   Intraparenchymal hemorrhage of brain ([place]) 06/20/2022   ICH (intracerebral hemorrhage) ([place]) 06/20/2022   Spinal stenosis at L4-L5 level 07/21/2021   Spinal stenosis of lumbar region 02/17/2021   Closed trimalleolar fracture of left ankle 06/22/2020   Special screening for malignant neoplasms, colon    Diverticulosis of colon without hemorrhage     ONSET DATE: 06/18/2022   REFERRING DIAG: I61.9 (ICD-10-CM) - Intraparenchymal hemorrhage of brain (HCC)  THERAPY DIAG:  Cognitive communication deficit  Rationale for Evaluation and Treatment: Rehabilitation  SUBJECTIVE:   SUBJECTIVE STATEMENT: "I just can't remember some words sometimes." Pt accompanied by: self  PERTINENT HISTORY: PRECILLA PURNELL is an 77 y.o. female with HTN, thyroid disease, arthritis, LBBB who states she was in her normal state, out shopping with husband on Sunday 06/18/22 and had sudden onset of headache, N/V. She has felt gen unwell since, unable to tolerate much activity or food. She became gen weak and husband noted word finding difficulty which prompted them to finally seek medical attention at [place]. No falls or trauma reported. CTH showed large left temporal ICH with some IVH. She was admitted to [facility] from 06/20/22-06/26/22 and discharged home with referral from Beulah Gandy, NP  for cognitive linguistic evaluation and treatment. Pt indicates that she does not really remember much from being in the hospital.   PAIN:  Are you having pain? No  FALLS: Has patient fallen in last 6 months?  No  LIVING ENVIRONMENT: Lives with: lives with their family and lives with their spouse Lives in: House/apartment  PLOF:  Level of assistance: Independent with ADLs, Independent with IADLs Employment: Retired  PATIENT GOALS: Improve memory for recalling her  grandchildren's birthdays  OBJECTIVE:   DIAGNOSTIC FINDINGS: MRI  Redemonstrated [place] centered in the left posterior temporal and lateral occipital region, likely unchanged.  Mild surrounding edema with mass effect on the left lateral ventricle atrium, temporal horn, and occipital horn, with a small amount of left greater than right IVH.  COGNITION: Overall cognitive status: Impaired Areas of impairment:  Attention: Impaired: Sustained Memory: Impaired: Immediate Working Programme researcher, broadcasting/film/video function: Impaired: Organization and Planning Functional deficits: Pt had difficulty planning out a schedule for the day according to specific criteria and also difficulty recalling birthdays of family members.  AUDITORY COMPREHENSION: Overall auditory comprehension: Appears intact YES/NO questions: Appears intact Following directions: Appears intact Conversation: Complex Interfering components: working Contractor: repetition/stressing words  READING COMPREHENSION: Intact  EXPRESSION: verbal  VERBAL EXPRESSION: Level of generative/spontaneous verbalization: conversation Automatic speech: name: intact, social response: intact, and day of week: intact  Repetition: Appears intact Naming: Responsive: 76-100%, Confrontation: 76-100%, and Divergent: 76-100% Pragmatics: Appears intact Comments: N/A Interfering components:  N/A Effective technique: semantic cues Non-verbal means of communication: N/A  WRITTEN EXPRESSION: Dominant hand: right Written expression: Appears intact  MOTOR SPEECH: Overall motor speech: Appears intact Level of impairment:  N/A Respiration: diaphragmatic/abdominal breathing Phonation: normal, hoarse, and in the last few days Resonance: WFL Articulation: Appears intact Intelligibility: Intelligible Motor planning: Appears intact Motor speech errors:  N/A Interfering components:  N/A Effective technique:  N/A  ORAL MOTOR EXAMINATION: Overall status:  WFL Comments: N/A   STANDARDIZED ASSESSMENTS: SLUMS: 20/30 and MAST 100/100  [facility] SLUMS Examination Orientation  3/3  Numeric Problem Solving  3/3  Memory  0/5  Attention 1/2  Thought Organization 1/3 (9 animals)  Clock Drawing 2/4  Visuospatial Skills               2/2  Short Story Recall  8/8  Total  20/30     Scoring  High School Education  Less than High School Education   Normal  27-30 25-30  Mild Neurocognitive Disorder 21-26 20-24  Dementia  1-20 1-19   MAST Pt was administered the [place] Aphasia Screening Tool (MAST) and achieved an overall expressive score of 50/50, receptive score of 50/50, and a total score of 100/100.  Expressive Index Naming 10/10 Automatic Speech 10/10 Repetition 10/10 Writing 10/10 Verbal Fluency 10/10 Expressive Subscale 50/50  Receptive Index Yes/No Accuracy 20/20 Object Recognition 10/10 Following Instructions 10/10 Reading Instructions 10/10 Receptive Subscale 50/50  Total Index Expressive 50/50 Receptive 50/50 Total Score 100/100   TODAY'S TREATMENT:  Evaluation only today  DATE: 08/02/22    PATIENT EDUCATION: Education details: Plan for short term SLP therapy to target word finding and recall Person educated: Patient Education method: Explanation and Handouts Education comprehension: verbalized understanding   GOALS: Goals reviewed with patient? Yes  SHORT TERM GOALS: Target date: 08/09/2022  Pt will implement memory strategies in functional therapy activities with 90% acc with mi/mod cues Baseline: max assist Goal status: INITIAL  2.  Pt will record 3 or greater weekly appointments, reminders, to-do items in her smart phone to facilitate task completion.  Baseline: inconsistently uses Goal status: INITIAL  3.  Pt will complete moderate-level thought organization and  planning activities with 90% acc and min assist. Baseline: mod assist Goal status: INITIAL  4.  Pt will implement word-finding strategies with 90% accuracy when unable to verbalize desired word in conversation/functional tasks with min assist.    Baseline: self report of ~75% Goal status: INITIAL   LONG TERM GOALS: Same as short term goals  ASSESSMENT:  CLINICAL IMPRESSION: Patient is a 77 y.o. female who was seen today for cognitive linguistic evaluation. Pt initially presented with alexia and mild expressive aphasia with poor word retrieval during acute hospitalization. Pt did not exhibit alexia or significant aphasia today. She scored a 100 on the MAST and 20/30 on the [facility] with deficits in attention, memory/recall, executive functioning, and category naming. Pt endorses some mild word retrieval deficits in conversation at home (not yet back to baseline). She typically enjoys baking, cooking, working in the yard (when not bothered by back pain), visiting with church friends and family, watching TV, and using her iPad. She generally handles the finances and drives. She uses a pill box for medication management and a wall calendar to remember appointments. She is currently having a hard time remember the dates of her family members' birthdays and is frustrated by this. She is motivated to work on Marine scientist and word finding skills.   OBJECTIVE IMPAIRMENTS: include attention, memory, executive functioning, and expressive language. These impairments are limiting patient from household responsibilities and effectively communicating at home and in community. Factors affecting potential to achieve goals and functional outcome are ability to learn/carryover information. Patient will benefit from skilled SLP services to address above impairments and improve overall function.  REHAB POTENTIAL: Excellent  PLAN:  SLP FREQUENCY: 1x/week  SLP DURATION: 6 weeks  PLANNED INTERVENTIONS: Cueing  hierachy, Cognitive reorganization, Functional tasks, SLP instruction and feedback, Compensatory strategies, Patient/family education, and Re-evaluation    Thank you,  Genene Churn, [place]  [place], [place] 08/02/2022, 12:30 PM

## 2022-08-03 ENCOUNTER — Ambulatory Visit (HOSPITAL_COMMUNITY): Payer: Medicare PPO | Attending: Nurse Practitioner

## 2022-08-03 DIAGNOSIS — R41841 Cognitive communication deficit: Secondary | ICD-10-CM | POA: Diagnosis not present

## 2022-08-03 DIAGNOSIS — R262 Difficulty in walking, not elsewhere classified: Secondary | ICD-10-CM | POA: Insufficient documentation

## 2022-08-03 NOTE — Therapy (Signed)
OUTPATIENT PHYSICAL THERAPY NEURO TREATMENT   Patient Name: Deborah Kerr MRN: [id number] DOB:[birth year], 77 y.o., female Today's Date: 08/03/2022   PCP: Manon Hilding, MD  REFERRING PROVIDER: August Albino, NP  END OF SESSION:  PT End of Session - 08/03/22 1436     Visit Number 2    Number of Visits 8    Date for PT Re-Evaluation 08/29/22    Progress Note Due on Visit 10    PT Start Time 1430    PT Stop Time 1515    PT Time Calculation (min) 45 min    Activity Tolerance Patient limited by fatigue              Past Medical History:  Diagnosis Date   Arthritis    rheumatoid arthritis   Hypertension    Hypothyroidism 2009   Left bundle branch block    Thyroid disease    Past Surgical History:  Procedure Laterality Date   APPENDECTOMY     per patient 1970s   COLONOSCOPY     COLONOSCOPY N/A 05/18/2015   Procedure: COLONOSCOPY;  Surgeon: Daneil Dolin, MD;  [facility];  Service: Endoscopy;  Laterality: N/A;  11:30   EYE SURGERY Bilateral 2021   cataract surgery   HARDWARE REMOVAL Left 12/22/2020   Procedure: HARDWARE REMOVAL LEFT ANKLE;  Surgeon: Shona Needles, MD;  Location: [place];  Service: Orthopedics;  Laterality: Left;   LUMBAR LAMINECTOMY/DECOMPRESSION MICRODISCECTOMY N/A 07/21/2021   Procedure: Microlumbar decompression Lumbar Four-Five and Lumbar Five - Sacral One, excision of synovial cyst;  Surgeon: Susa Day, MD;  Location: [place];  Service: Orthopedics;  Laterality: N/A;   OOPHORECTOMY     ORIF ANKLE FRACTURE Left 06/24/2020   Procedure: OPEN REDUCTION INTERNAL FIXATION (ORIF) ANKLE FRACTURE;  Surgeon: Shona Needles, MD;  Location: [place];  Service: Orthopedics;  Laterality: Left;   Thyroidectomy  2006   TUBAL LIGATION     Patient Active Problem List   Diagnosis Date Noted   Cerebral edema ([place]) 06/26/2022   Compression of brain ([place]) 06/26/2022   Intractable headache 06/26/2022   HTN (hypertension) 06/26/2022    Hypokalemia 06/26/2022   Hypocalcemia 06/26/2022   Hypothyroidism 06/26/2022   Intraparenchymal hemorrhage of brain ([place]) 06/20/2022   ICH (intracerebral hemorrhage) ([place]) 06/20/2022   Spinal stenosis at L4-L5 level 07/21/2021   Spinal stenosis of lumbar region 02/17/2021   Closed trimalleolar fracture of left ankle 06/22/2020   Special screening for malignant neoplasms, colon    Diverticulosis of colon without hemorrhage     ONSET DATE: 06/20/22  REFERRING DIAG: I61.9 (ICD-10-CM) - Intraparenchymal hemorrhage of brain (HCC) R53.1 (ICD-10-CM) - Weakness  THERAPY DIAG:  Difficulty walking  Rationale for Evaluation and Treatment: Rehabilitation  SUBJECTIVE:  SUBJECTIVE STATEMENT: Patient denies any pain today but claims that the legs are tired especially when she does her HEP.  EVALUATION: Patient has difficulty remembering things. Patient reports that her legs get tired easily especially when she has to go outside and do some grocery shopping. However, patient reports that the push carts are helping her when she gets her grocery. Patient states that she is slow and takes time when she walks. Patient denies any numbness on the legs. However, patient states that the L shin can be "tingly" at times and attributes it to the hx of ankle fx. Condition started on 06/20/22 when she had a bad HA and episodes of vomiting. Patient then went to the ER in [place] but was then taken to the hospital in [place]. Several imaging was done which revealed intraparenchymal hemorrhage of the brain. Patient stayed in the hospital for 3 days. Patient remembers that she received PT in the hospital where she walked around the hallway. No rehab done upon hospital D/C. Patient then went to the neurologist as an outpatient and was  then referred to PT evaluation and management. Patient was also referred to OT and SLP.  Pt accompanied by: self  PERTINENT HISTORY: HTN, lumbar stenosis, hx of L ankle fx  PAIN:  Are you having pain? No  PRECAUTIONS: Other: HTN  WEIGHT BEARING RESTRICTIONS: No  FALLS: Has patient fallen in last 6 months? No  LIVING ENVIRONMENT: Lives with: lives with their spouse Lives in: House/apartment Stairs: Yes: External: 2 steps; on right going up Has following equipment at home: Single point cane and Walker - 4 wheeled  PLOF: Independent  PATIENT GOALS: "to help me move better"  OBJECTIVE:   DIAGNOSTIC FINDINGS:  MRI head 06/20/22 IMPRESSION: 1. Redemonstrated hematoma centered in the left posterior temporal and lateral occipital region, with an approximate volume of 31 mL, likely unchanged compared to the prior CT when accounting for differences in technique. No abnormal enhancement to suggest active extravasation. 2. Mild surrounding edema with mass effect on the left lateral ventricle atrium, temporal horn, and occipital horn, with a small amount of left greater than right intraventricular extension.  COGNITION: Overall cognitive status: Within functional limits for tasks assessed   SENSATION: WFL  COORDINATION: No difficulty on heel-to-shin, alternating foot taps  MUSCLE TONE: B LE exhibit normal tone  MUSCLE LENGTH: Mild tightness on B hamstrings Moderate tightness on B gastrocsoleus   POSTURE: rounded shoulders, forward head, and decreased lumbar lordosis  LOWER EXTREMITY ROM:     Active  Right Eval Left Eval  Hip flexion [facility]  Hip extension    Hip abduction [facility]  Hip adduction    Hip internal rotation    Hip external rotation    Knee flexion [facility]  Knee extension [facility]  Ankle dorsiflexion [facility] WFL  Ankle plantarflexion [facility] WFL  Ankle inversion    Ankle eversion     (Blank rows = not tested)  LOWER EXTREMITY MMT:    MMT Right Eval  Left Eval  Hip flexion 4- 4  Hip extension    Hip abduction 4 4  Hip adduction    Hip internal rotation    Hip external rotation    Knee flexion 4- 4-  Knee extension 4- 4-  Ankle dorsiflexion 4+ 4+  Ankle plantarflexion 4+ 4+  Ankle inversion    Ankle eversion    (Blank rows = not tested)   TRANSFERS: Assistive device utilized: None  Sit to stand: Complete  Independence Stand to sit: Complete Independence  GAIT: Gait pattern: step through pattern, decreased arm swing- Right, decreased arm swing- Left, decreased stride length, decreased hip/knee flexion- Right, decreased hip/knee flexion- Left, decreased ankle dorsiflexion- Right, decreased ankle dorsiflexion- Left, trunk flexed, and narrow BOS Distance walked: 327 Assistive device utilized: None Level of assistance: Complete Independence   FUNCTIONAL TESTS:  Timed up and go (TUG): 8.50 2 minute walk test: 327 ft.  Rhomberg's test: slight to mild unsteadiness  Good static/dynamic standing balance  TODAY'S TREATMENT:                                                                                                                              DATE:  08/03/22 Recumbent stepper, seat 5, level 1 x 5' Standing on a counter/table/parallel bars, 2 x 10:  Marches Mini squats x 6" hold  Heel raises  Hip vector  Hamstring curls  Step up, 4" box x 10 x 2 each side  Tandem stance, eyes open, firm surface, x 30" x 2 on each LE  Reaching in various direction, foam x 1'  Forward/backward rocking on a wobble board x 10 Seated: Gastrocsoleus stretch with a strap x 30" x 3 LAQ x 3" x 10 x 2  08/01/22 Evaluation and patient education Standing on a counter/table, 2 x 10:  Mini squats x 6" hold  Heel raises  Hip abd Seated: Gastrocsoleus stretch with a strap x 30" x 3    PATIENT EDUCATION: Education details: Educated on the pathoanatomy of ICH. Educated on the goals and course of rehab. Written HEP provided and reviewed Person  educated: Patient Education method: Explanation and Demonstration Education comprehension: verbalized understanding and returned demonstration  HOME EXERCISE PROGRAM: Access Code: [email or web address] URL: [email or web address] Date: 08/01/2022 Prepared by: Rexene Alberts  Exercises - Seated Calf Stretch with Strap  - 1-2 x daily - 7 x weekly - 3 reps - 30 hold - Mini Squat with Counter Support  - 1-2 x daily - 7 x weekly - 2 sets - 10 reps - 6 hold - Heel Raises with Counter Support  - 1-2 x daily - 7 x weekly - 2 sets - 10 reps - Standing Hip Abduction with Counter Support  - 1-2 x daily - 7 x weekly - 2 sets - 10 reps  GOALS: Goals reviewed with patient? Yes  SHORT TERM GOALS: Target date: 08/15/22  Ptwill demonstrate indep in HEP to facilitate carry-over of skilled services and improve functional outcomes Goal status: INITIAL   LONG TERM GOALS: Target date: 08/29/22  Pt will increase 2MWT by at least 40 ft in order to demonstrate clinically significant improvement in community ambulation Baseline: 327 ft Goal status: INITIAL  2.  Pt will have a decrease in TUG score by at least 3 sec in order to demonstrate clinically significant improvement in community ambulation Baseline: 8.9 sec Goal status: INITIAL  3.  Pt will demonstrate increase in LE strength to  4+ to facilitate ease and safety in ambulation Baseline: 4- Goal status: INITIAL  ASSESSMENT:  CLINICAL IMPRESSION: Demonstrated mild to moderate level of fatigue. Rest periods provided. Required little amount of cueing to ensure correct execution of activity. Mild to moderate unsteadiness seen in tandem stance and reaching due to impaired balance and proprioception. To date, skilled PT is required to address the impairments and improve function.  Patient is a 77 y.o. female who was seen today for physical therapy evaluation and treatment for difficulty with walking. Patient was diagnosed with intraparenchymal  hemorrhage of the brain by referring provider further defined by difficulty with grocery shopping and walking due to LE weakness and impaired proprioception. Skilled PT is required to address the impairments and functional limitations listed below. Patient will benefit from LE strengthening, balance and gait exercises.  OBJECTIVE IMPAIRMENTS: Abnormal gait, decreased balance, difficulty walking, decreased strength, and impaired flexibility.   ACTIVITY LIMITATIONS:  ambulation  PARTICIPATION LIMITATIONS: driving, shopping, and community activity  PERSONAL FACTORS: Age are also affecting patient's functional outcome.   REHAB POTENTIAL: Excellent  CLINICAL DECISION MAKING: Stable/uncomplicated  EVALUATION COMPLEXITY: Low  PLAN:  PT FREQUENCY: 2x/week  PT DURATION: 4 weeks  PLANNED INTERVENTIONS: Therapeutic exercises, Therapeutic activity, Neuromuscular re-education, Balance training, Gait training, Patient/Family education, Self Care, and Joint mobilization  PLAN FOR NEXT SESSION: Continue POC and may progress as tolerated with emphasis on LE strengthening and initiate proprioception/balance exercises.   Harvie Heck. Atoya Andrew, PT, DPT, OCS Board-Certified Clinical Specialist in [place] # ([place]): [id number] T 08/03/2022, 3:21 PM

## 2022-08-08 ENCOUNTER — Ambulatory Visit (HOSPITAL_COMMUNITY): Payer: Medicare PPO | Admitting: Physical Therapy

## 2022-08-08 DIAGNOSIS — R262 Difficulty in walking, not elsewhere classified: Secondary | ICD-10-CM

## 2022-08-08 DIAGNOSIS — R41841 Cognitive communication deficit: Secondary | ICD-10-CM | POA: Diagnosis not present

## 2022-08-08 NOTE — Therapy (Signed)
OUTPATIENT PHYSICAL THERAPY NEURO TREATMENT   Patient Name: Deborah Kerr MRN: [id number] DOB:01-13-1946, 77 y.o., female Today's Date: 08/08/2022   PCP: Manon Hilding, MD  REFERRING PROVIDER: August Albino, NP  END OF SESSION:  PT End of Session - 08/08/22 1237     Visit Number 3    Number of Visits 8    Date for PT Re-Evaluation 08/29/22    Progress Note Due on Visit 10    PT Start Time 0945    PT Stop Time 1030    PT Time Calculation (min) 45 min    Activity Tolerance Patient limited by fatigue               Past Medical History:  Diagnosis Date   Arthritis    rheumatoid arthritis   Hypertension    Hypothyroidism 2009   Left bundle branch block    Thyroid disease    Past Surgical History:  Procedure Laterality Date   APPENDECTOMY     per patient 1970s   COLONOSCOPY     COLONOSCOPY N/A 05/18/2015   Procedure: COLONOSCOPY;  Surgeon: Daneil Dolin, MD;  [facility];  Service: Endoscopy;  Laterality: N/A;  11:30   EYE SURGERY Bilateral 2021   cataract surgery   HARDWARE REMOVAL Left 12/22/2020   Procedure: HARDWARE REMOVAL LEFT ANKLE;  Surgeon: Shona Needles, MD;  Location: [place];  Service: Orthopedics;  Laterality: Left;   LUMBAR LAMINECTOMY/DECOMPRESSION MICRODISCECTOMY N/A 07/21/2021   Procedure: Microlumbar decompression Lumbar Four-Five and Lumbar Five - Sacral One, excision of synovial cyst;  Surgeon: Susa Day, MD;  Location: [place];  Service: Orthopedics;  Laterality: N/A;   OOPHORECTOMY     ORIF ANKLE FRACTURE Left 06/24/2020   Procedure: OPEN REDUCTION INTERNAL FIXATION (ORIF) ANKLE FRACTURE;  Surgeon: Shona Needles, MD;  Location: [place];  Service: Orthopedics;  Laterality: Left;   Thyroidectomy  2006   TUBAL LIGATION     Patient Active Problem List   Diagnosis Date Noted   Cerebral edema ([place]) 06/26/2022   Compression of brain ([place]) 06/26/2022   Intractable headache 06/26/2022   HTN (hypertension) 06/26/2022    Hypokalemia 06/26/2022   Hypocalcemia 06/26/2022   Hypothyroidism 06/26/2022   Intraparenchymal hemorrhage of brain ([place]) 06/20/2022   ICH (intracerebral hemorrhage) ([place]) 06/20/2022   Spinal stenosis at L4-L5 level 07/21/2021   Spinal stenosis of lumbar region 02/17/2021   Closed trimalleolar fracture of left ankle 06/22/2020   Special screening for malignant neoplasms, colon    Diverticulosis of colon without hemorrhage     ONSET DATE: 06/20/22  REFERRING DIAG: I61.9 (ICD-10-CM) - Intraparenchymal hemorrhage of brain (HCC) R53.1 (ICD-10-CM) - Weakness  THERAPY DIAG:  Difficulty walking  Rationale for Evaluation and Treatment: Rehabilitation  SUBJECTIVE:  SUBJECTIVE STATEMENT: Patient reports her legs feel weak but no pain or issues. Reports compliance with HEP.  EVALUATION: Patient has difficulty remembering things. Patient reports that her legs get tired easily especially when she has to go outside and do some grocery shopping. However, patient reports that the push carts are helping her when she gets her grocery. Patient states that she is slow and takes time when she walks. Patient denies any numbness on the legs. However, patient states that the L shin can be "tingly" at times and attributes it to the hx of ankle fx. Condition started on 06/20/22 when she had a bad HA and episodes of vomiting. Patient then went to the ER in [place] but was then taken to the hospital in [place]. Several imaging was done which revealed intraparenchymal hemorrhage of the brain. Patient stayed in the hospital for 3 days. Patient remembers that she received PT in the hospital where she walked around the hallway. No rehab done upon hospital D/C. Patient then went to the neurologist as an outpatient and was then  referred to PT evaluation and management. Patient was also referred to OT and SLP.  Pt accompanied by: self  PERTINENT HISTORY: HTN, lumbar stenosis, hx of L ankle fx  PAIN:  Are you having pain? No  PRECAUTIONS: Other: HTN  WEIGHT BEARING RESTRICTIONS: No  FALLS: Has patient fallen in last 6 months? No  LIVING ENVIRONMENT: Lives with: lives with their spouse Lives in: House/apartment Stairs: Yes: External: 2 steps; on right going up Has following equipment at home: Single point cane and Walker - 4 wheeled  PLOF: Independent  PATIENT GOALS: "to help me move better"  OBJECTIVE:   DIAGNOSTIC FINDINGS:  MRI head 06/20/22 IMPRESSION: 1. Redemonstrated hematoma centered in the left posterior temporal and lateral occipital region, with an approximate volume of 31 mL, likely unchanged compared to the prior CT when accounting for differences in technique. No abnormal enhancement to suggest active extravasation. 2. Mild surrounding edema with mass effect on the left lateral ventricle atrium, temporal horn, and occipital horn, with a small amount of left greater than right intraventricular extension.  COGNITION: Overall cognitive status: Within functional limits for tasks assessed   SENSATION: WFL  COORDINATION: No difficulty on heel-to-shin, alternating foot taps  MUSCLE TONE: B LE exhibit normal tone  MUSCLE LENGTH: Mild tightness on B hamstrings Moderate tightness on B gastrocsoleus   POSTURE: rounded shoulders, forward head, and decreased lumbar lordosis  LOWER EXTREMITY ROM:     Active  Right Eval Left Eval  Hip flexion [facility]  Hip extension    Hip abduction [facility]  Hip adduction    Hip internal rotation    Hip external rotation    Knee flexion [facility]  Knee extension [facility]  Ankle dorsiflexion [facility] WFL  Ankle plantarflexion [facility] WFL  Ankle inversion    Ankle eversion     (Blank rows = not tested)  LOWER EXTREMITY MMT:    MMT Right Eval  Left Eval  Hip flexion 4- 4  Hip extension    Hip abduction 4 4  Hip adduction    Hip internal rotation    Hip external rotation    Knee flexion 4- 4-  Knee extension 4- 4-  Ankle dorsiflexion 4+ 4+  Ankle plantarflexion 4+ 4+  Ankle inversion    Ankle eversion    (Blank rows = not tested)   TRANSFERS: Assistive device utilized: None  Sit to stand: Complete Independence Stand to  sit: Complete Independence  GAIT: Gait pattern: step through pattern, decreased arm swing- Right, decreased arm swing- Left, decreased stride length, decreased hip/knee flexion- Right, decreased hip/knee flexion- Left, decreased ankle dorsiflexion- Right, decreased ankle dorsiflexion- Left, trunk flexed, and narrow BOS Distance walked: 327 Assistive device utilized: None Level of assistance: Complete Independence   FUNCTIONAL TESTS:  Timed up and go (TUG): 8.50 2 minute walk test: 327 ft.  Rhomberg's test: slight to mild unsteadiness  Good static/dynamic standing balance  TODAY'S TREATMENT:                                                                                                                              DATE:  08/08/22 Nustep seat 5, level 2 x 5' UE/LE use Standing on a counter/table/parallel bars, 2 x 10 bilaterally:   Marches Mini squats x 6" hold   Heel raises/toeraises   Hip abduction    Hip extensions   Hamstring curls   Step up forward 6" box  Step up lateral 4" box Slant board stretch 3X30" Vectors 10X5" 1 UE assist each LE   08/03/22 Recumbent stepper, seat 5, level 1 x 5' Standing on a counter/table/parallel bars, 2 x 10:  Marches Mini squats x 6" hold  Heel raises  Hip vector  Hamstring curls  Step up, 4" box x 10 x 2 each side  Tandem stance, eyes open, firm surface, x 30" x 2 on each LE  Reaching in various direction, foam x 1'  Forward/backward rocking on a wobble board x 10 Seated: Gastrocsoleus stretch with a strap x 30" x 3 LAQ x 3" x 10 x  2  08/01/22 Evaluation and patient education Standing on a counter/table, 2 x 10:  Mini squats x 6" hold  Heel raises  Hip abd Seated: Gastrocsoleus stretch with a strap x 30" x 3    PATIENT EDUCATION: Education details: Educated on the pathoanatomy of ICH. Educated on the goals and course of rehab. Written HEP provided and reviewed Person educated: Patient Education method: Explanation and Demonstration Education comprehension: verbalized understanding and returned demonstration  HOME EXERCISE PROGRAM: Access Code: [email or web address] URL: [email or web address] Date: 08/01/2022 Prepared by: Krystal Clark Exercises - Seated Calf Stretch with Strap  - 1-2 x daily - 7 x weekly - 3 reps - 30 hold - Mini Squat with Counter Support  - 1-2 x daily - 7 x weekly - 2 sets - 10 reps - 6 hold - Heel Raises with Counter Support  - 1-2 x daily - 7 x weekly - 2 sets - 10 reps - Standing Hip Abduction with Counter Support  - 1-2 x daily - 7 x weekly - 2 sets - 10 reps  08/08/22 Vectors 10X5" bil LE  GOALS: Goals reviewed with patient? Yes  SHORT TERM GOALS: Target date: 08/15/22  Ptwill demonstrate indep in HEP to facilitate carry-over of skilled services and improve functional outcomes Goal status: IN PROGRESS   LONG TERM  GOALS: Target date: 08/29/22  Pt will increase 2MWT by at least 40 ft in order to demonstrate clinically significant improvement in community ambulation Baseline: 327 ft Goal status: IN PROGRESS  2.  Pt will have a decrease in TUG score by at least 3 sec in order to demonstrate clinically significant improvement in community ambulation Baseline: 8.9 sec Goal status: IN PROGRESS  3.  Pt will demonstrate increase in LE strength to 4+ to facilitate ease and safety in ambulation Baseline: 4- Goal status: IN PROGRESS  ASSESSMENT:  CLINICAL IMPRESSION: Continued with focus on improving LE strength and stability.  No complaints throughout treatment, however does  express LE weakness and fatigue with appropriate rest periods provided. Required little amount of cueing to ensure correct execution of activity.  Tends to use hip flexor with hip abduction requiring demonstration to resolve.  Increased challenge of step up to 6" height and added lateral step downs to work on eccentric control.  Vectors added to HEP to work on stability. Pt will continue to benefit from skilled therapy to address deficits and improve overall function.  Patient is a 77 y.o. female who was seen today for physical therapy evaluation and treatment for difficulty with walking. Patient was diagnosed with intraparenchymal hemorrhage of the brain by referring provider further defined by difficulty with grocery shopping and walking due to LE weakness and impaired proprioception. Skilled PT is required to address the impairments and functional limitations listed below. Patient will benefit from LE strengthening, balance and gait exercises.  OBJECTIVE IMPAIRMENTS: Abnormal gait, decreased balance, difficulty walking, decreased strength, and impaired flexibility.   ACTIVITY LIMITATIONS:  ambulation  PARTICIPATION LIMITATIONS: driving, shopping, and community activity  PERSONAL FACTORS: Age are also affecting patient's functional outcome.   REHAB POTENTIAL: Excellent  CLINICAL DECISION MAKING: Stable/uncomplicated  EVALUATION COMPLEXITY: Low  PLAN:  PT FREQUENCY: 2x/week  PT DURATION: 4 weeks  PLANNED INTERVENTIONS: Therapeutic exercises, Therapeutic activity, Neuromuscular re-education, Balance training, Gait training, Patient/Family education, Self Care, and Joint mobilization  PLAN FOR NEXT SESSION: Continue POC and may progress as tolerated with emphasis on LE strengthening and initiate proprioception/balance exercises. Next session resume balance challenges and add sit to stand no UE from standard surface. Update HEP as needed.   Teena Irani, PTA/CLT [place] Ph: [phone]  08/08/2022, 12:37 PM

## 2022-08-10 ENCOUNTER — Ambulatory Visit (HOSPITAL_COMMUNITY): Payer: Medicare PPO

## 2022-08-10 DIAGNOSIS — M810 Age-related osteoporosis without current pathological fracture: Secondary | ICD-10-CM | POA: Diagnosis not present

## 2022-08-10 DIAGNOSIS — R41841 Cognitive communication deficit: Secondary | ICD-10-CM

## 2022-08-10 DIAGNOSIS — R262 Difficulty in walking, not elsewhere classified: Secondary | ICD-10-CM | POA: Diagnosis not present

## 2022-08-10 DIAGNOSIS — M8589 Other specified disorders of bone density and structure, multiple sites: Secondary | ICD-10-CM | POA: Diagnosis not present

## 2022-08-10 NOTE — Therapy (Signed)
OUTPATIENT PHYSICAL THERAPY NEURO TREATMENT   Patient Name: Deborah Kerr MRN: [id number] DOB:13-Jun-1946, 77 y.o., female Today's Date: 08/10/2022   PCP: Manon Hilding, MD  REFERRING PROVIDER: August Albino, NP  END OF SESSION:  PT End of Session - 08/10/22 1512     Visit Number 4    Number of Visits 8    Date for PT Re-Evaluation 08/29/22    Authorization Type [employer] (auth required -, no visit limits)    Progress Note Due on Visit 10    PT Start Time 1515    PT Stop Time 1600    PT Time Calculation (min) 45 min    Activity Tolerance Patient tolerated treatment well               Past Medical History:  Diagnosis Date   Arthritis    rheumatoid arthritis   Hypertension    Hypothyroidism 2009   Left bundle branch block    Thyroid disease    Past Surgical History:  Procedure Laterality Date   APPENDECTOMY     per patient 1970s   COLONOSCOPY     COLONOSCOPY N/A 05/18/2015   Procedure: COLONOSCOPY;  Surgeon: Daneil Dolin, MD;  [facility];  Service: Endoscopy;  Laterality: N/A;  11:30   EYE SURGERY Bilateral 2021   cataract surgery   HARDWARE REMOVAL Left 12/22/2020   Procedure: HARDWARE REMOVAL LEFT ANKLE;  Surgeon: Shona Needles, MD;  Location: [facility];  Service: Orthopedics;  Laterality: Left;   LUMBAR LAMINECTOMY/DECOMPRESSION MICRODISCECTOMY N/A 07/21/2021   Procedure: Microlumbar decompression Lumbar Four-Five and Lumbar Five - Sacral One, excision of synovial cyst;  Surgeon: Susa Day, MD;  Location: [facility];  Service: Orthopedics;  Laterality: N/A;   OOPHORECTOMY     ORIF ANKLE FRACTURE Left 06/24/2020   Procedure: OPEN REDUCTION INTERNAL FIXATION (ORIF) ANKLE FRACTURE;  Surgeon: Shona Needles, MD;  Location: [facility];  Service: Orthopedics;  Laterality: Left;   Thyroidectomy  2006   TUBAL LIGATION     Patient Active Problem List   Diagnosis Date Noted   Cerebral edema ([place]) 06/26/2022   Compression of brain ([place])  06/26/2022   Intractable headache 06/26/2022   HTN (hypertension) 06/26/2022   Hypokalemia 06/26/2022   Hypocalcemia 06/26/2022   Hypothyroidism 06/26/2022   Intraparenchymal hemorrhage of brain ([place]) 06/20/2022   ICH (intracerebral hemorrhage) ([place]) 06/20/2022   Spinal stenosis at L4-L5 level 07/21/2021   Spinal stenosis of lumbar region 02/17/2021   Closed trimalleolar fracture of left ankle 06/22/2020   Special screening for malignant neoplasms, colon    Diverticulosis of colon without hemorrhage     ONSET DATE: 06/20/22  REFERRING DIAG: I61.9 (ICD-10-CM) - Intraparenchymal hemorrhage of brain (HCC) R53.1 (ICD-10-CM) - Weakness  THERAPY DIAG:  Difficulty walking  Cognitive communication deficit  Rationale for Evaluation and Treatment: Rehabilitation  SUBJECTIVE:  SUBJECTIVE STATEMENT: Patient reports that she's doing well today and has been doing her HEP. Patient states that she got tired after the last session. Patient also states that she had her bone scan today and will get the results next week.  EVALUATION: Patient has difficulty remembering things. Patient reports that her legs get tired easily especially when she has to go outside and do some grocery shopping. However, patient reports that the push carts are helping her when she gets her grocery. Patient states that she is slow and takes time when she walks. Patient denies any numbness on the legs. However, patient states that the L shin can be "tingly" at times and attributes it to the hx of ankle fx. Condition started on 06/20/22 when she had a bad HA and episodes of vomiting. Patient then went to the ER in [place] but was then taken to the hospital in [place]. Several imaging was done which revealed intraparenchymal hemorrhage of the  brain. Patient stayed in the hospital for 3 days. Patient remembers that she received PT in the hospital where she walked around the hallway. No rehab done upon hospital D/C. Patient then went to the neurologist as an outpatient and was then referred to PT evaluation and management. Patient was also referred to OT and SLP.  Pt accompanied by: self  PERTINENT HISTORY: HTN, lumbar stenosis, hx of L ankle fx  PAIN:  Are you having pain? No  PRECAUTIONS: Other: HTN  WEIGHT BEARING RESTRICTIONS: No  FALLS: Has patient fallen in last 6 months? No  LIVING ENVIRONMENT: Lives with: lives with their spouse Lives in: House/apartment Stairs: Yes: External: 2 steps; on right going up Has following equipment at home: Single point cane and Walker - 4 wheeled  PLOF: Independent  PATIENT GOALS: "to help me move better"  OBJECTIVE:   DIAGNOSTIC FINDINGS:  MRI head 06/20/22 IMPRESSION: 1. Redemonstrated hematoma centered in the left posterior temporal and lateral occipital region, with an approximate volume of 31 mL, likely unchanged compared to the prior CT when accounting for differences in technique. No abnormal enhancement to suggest active extravasation. 2. Mild surrounding edema with mass effect on the left lateral ventricle atrium, temporal horn, and occipital horn, with a small amount of left greater than right intraventricular extension.  COGNITION: Overall cognitive status: Within functional limits for tasks assessed   SENSATION: WFL  COORDINATION: No difficulty on heel-to-shin, alternating foot taps  MUSCLE TONE: B LE exhibit normal tone  MUSCLE LENGTH: Mild tightness on B hamstrings Moderate tightness on B gastrocsoleus   POSTURE: rounded shoulders, forward head, and decreased lumbar lordosis  LOWER EXTREMITY ROM:     Active  Right Eval Left Eval  Hip flexion [facility]  Hip extension    Hip abduction [facility]  Hip adduction    Hip internal rotation    Hip  external rotation    Knee flexion [facility]  Knee extension [facility]  Ankle dorsiflexion [facility]  Ankle plantarflexion [facility] WFL  Ankle inversion    Ankle eversion     (Blank rows = not tested)  LOWER EXTREMITY MMT:    MMT Right Eval Left Eval  Hip flexion 4- 4  Hip extension    Hip abduction 4 4  Hip adduction    Hip internal rotation    Hip external rotation    Knee flexion 4- 4-  Knee extension 4- 4-  Ankle dorsiflexion 4+ 4+  Ankle plantarflexion 4+ 4+  Ankle inversion  Ankle eversion    (Blank rows = not tested)   TRANSFERS: Assistive device utilized: None  Sit to stand: Complete Independence Stand to sit: Complete Independence  GAIT: Gait pattern: step through pattern, decreased arm swing- Right, decreased arm swing- Left, decreased stride length, decreased hip/knee flexion- Right, decreased hip/knee flexion- Left, decreased ankle dorsiflexion- Right, decreased ankle dorsiflexion- Left, trunk flexed, and narrow BOS Distance walked: 327 Assistive device utilized: None Level of assistance: Complete Independence   FUNCTIONAL TESTS:  Timed up and go (TUG): 8.50 2 minute walk test: 327 ft.  Rhomberg's test: slight to mild unsteadiness  Good static/dynamic standing balance  TODAY'S TREATMENT:                                                                                                                              DATE:  08/10/22 Recumbent stepper, seat 5, level 3 x 5' Gastrocsoleus slant board stretch x 30" x 3 Standing on a counter/table/parallel bars, 2 x 10:  Marches x 2 lbs Mini squats x 6" hold  Heel raises x 2 lbs  Hip vector x 2 lbs  Hamstring curls x 2 lbs Tandem stance, eyes open, foam, 30" x 2 on each Step up, 6" box x 10 x 2 each side Reaching in various direction, foam x 1' Forward stepping over cones x 5 ft x 2 rounds Seated:  Hamstring stretch x 30" x 3 LAQ x 3" x 10 x 2 x 2 lb  08/08/22 Nustep seat 5, level 2 x 5' UE/LE use Standing on a  counter/table/parallel bars, 2 x 10 bilaterally:   Marches Mini squats x 6" hold   Heel raises/toeraises   Hip abduction    Hip extensions   Hamstring curls   Step up forward 6" box  Step up lateral 4" box Slant board stretch 3X30" Vectors 10X5" 1 UE assist each LE   08/03/22 Recumbent stepper, seat 5, level 1 x 5' Standing on a counter/table/parallel bars, 2 x 10:  Marches Mini squats x 6" hold  Heel raises  Hip vector  Hamstring curls  Step up, 4" box x 10 x 2 each side  Tandem stance, eyes open, firm surface, x 30" x 2 on each LE  Reaching in various direction, foam x 1' Seated: Gastrocsoleus stretch with a strap x 30" x 3 LAQ x 3" x 10 x 2  08/01/22 Evaluation and patient education Standing on a counter/table, 2 x 10:  Mini squats x 6" hold  Heel raises  Hip abd Seated: Gastrocsoleus stretch with a strap x 30" x 3    PATIENT EDUCATION: Education details: Updated HEP Person educated: Patient Education method: Explanation, Demonstration, and Handouts Education comprehension: verbalized understanding and returned demonstration  HOME EXERCISE PROGRAM: Access Code: [email or web address] URL: [email or web address]  08/10/2022 - Seated Hamstring Stretch  - 1-2 x daily - 7 x weekly - 3 reps - 30 hold  Date: 08/01/2022 Prepared by: Rexene Alberts Exercises -  Seated Calf Stretch with Strap  - 1-2 x daily - 7 x weekly - 3 reps - 30 hold - Mini Squat with Counter Support  - 1-2 x daily - 7 x weekly - 2 sets - 10 reps - 6 hold - Heel Raises with Counter Support  - 1-2 x daily - 7 x weekly - 2 sets - 10 reps - Standing Hip Abduction with Counter Support  - 1-2 x daily - 7 x weekly - 2 sets - 10 reps  08/08/22 Vectors 10X5" bil LE  GOALS: Goals reviewed with patient? Yes  SHORT TERM GOALS: Target date: 08/15/22  Ptwill demonstrate indep in HEP to facilitate carry-over of skilled services and improve functional outcomes Goal status: IN PROGRESS   LONG TERM GOALS:  Target date: 08/29/22  Pt will increase 2MWT by at least 40 ft in order to demonstrate clinically significant improvement in community ambulation Baseline: 327 ft Goal status: IN PROGRESS  2.  Pt will have a decrease in TUG score by at least 3 sec in order to demonstrate clinically significant improvement in community ambulation Baseline: 8.9 sec Goal status: IN PROGRESS  3.  Pt will demonstrate increase in LE strength to 4+ to facilitate ease and safety in ambulation Baseline: 4- Goal status: IN PROGRESS  ASSESSMENT:  CLINICAL IMPRESSION: Interventions today were geared towards improving LE flexibility, strength, and balance. Tolerated all activities without worsening of symptoms. Demonstrated appropriate levels of fatigue. Required slight amount of cueing to ensure correct execution of activity. Slight unsteadiness noted during reaching activities and stepping over cones due to impaired proprioception.To date, skilled PT is required to address the impairments and improve function.  Patient is a 77 y.o. female who was seen today for physical therapy evaluation and treatment for difficulty with walking. Patient was diagnosed with intraparenchymal hemorrhage of the brain by referring provider further defined by difficulty with grocery shopping and walking due to LE weakness and impaired proprioception. Skilled PT is required to address the impairments and functional limitations listed below. Patient will benefit from LE strengthening, balance and gait exercises.  OBJECTIVE IMPAIRMENTS: Abnormal gait, decreased balance, difficulty walking, decreased strength, and impaired flexibility.   ACTIVITY LIMITATIONS:  ambulation  PARTICIPATION LIMITATIONS: driving, shopping, and community activity  PERSONAL FACTORS: Age are also affecting patient's functional outcome.   REHAB POTENTIAL: Excellent  CLINICAL DECISION MAKING: Stable/uncomplicated  EVALUATION COMPLEXITY: Low  PLAN:  PT  FREQUENCY: 2x/week  PT DURATION: 4 weeks  PLANNED INTERVENTIONS: Therapeutic exercises, Therapeutic activity, Neuromuscular re-education, Balance training, Gait training, Patient/Family education, Self Care, and Joint mobilization  PLAN FOR NEXT SESSION: Continue POC and may progress as tolerated with emphasis on LE strengthening and initiate proprioception/balance exercises.   Harvie Heck. Anirudh Baiz, PT, DPT, OCS Board-Certified Clinical Specialist in [place] # ([place]): [id number] T 08/10/2022, 3:13 PM

## 2022-08-10 NOTE — Addendum Note (Signed)
Addended by: Ephraim Hamburger on: 08/10/2022 02:14 PM   Modules accepted: Orders

## 2022-08-15 ENCOUNTER — Ambulatory Visit (HOSPITAL_COMMUNITY): Payer: Medicare PPO | Admitting: Physical Therapy

## 2022-08-15 DIAGNOSIS — R41841 Cognitive communication deficit: Secondary | ICD-10-CM | POA: Diagnosis not present

## 2022-08-15 DIAGNOSIS — R262 Difficulty in walking, not elsewhere classified: Secondary | ICD-10-CM | POA: Diagnosis not present

## 2022-08-15 NOTE — Therapy (Signed)
OUTPATIENT PHYSICAL THERAPY NEURO TREATMENT   Patient Name: Deborah Kerr MRN: [id number] DOB:[birth year], 77 y.o., female Today's Date: 08/15/2022   PCP: Manon Hilding, MD  REFERRING PROVIDER: August Albino, NP  END OF SESSION:   PT End of Session - 08/15/22 1048     Visit Number 5    Number of Visits 8    Date for PT Re-Evaluation 08/29/22    Authorization Type [employer] (auth required -, no visit limits)    Progress Note Due on Visit 10    PT Start Time 1035    PT Stop Time 1115    PT Time Calculation (min) 40 min    Activity Tolerance Patient tolerated treatment well                Past Medical History:  Diagnosis Date   Arthritis    rheumatoid arthritis   Hypertension    Hypothyroidism 2009   Left bundle branch block    Thyroid disease    Past Surgical History:  Procedure Laterality Date   APPENDECTOMY     per patient 1970s   COLONOSCOPY     COLONOSCOPY N/A 05/18/2015   Procedure: COLONOSCOPY;  Surgeon: Daneil Dolin, MD;  [facility];  Service: Endoscopy;  Laterality: N/A;  11:30   EYE SURGERY Bilateral 2021   cataract surgery   HARDWARE REMOVAL Left 12/22/2020   Procedure: HARDWARE REMOVAL LEFT ANKLE;  Surgeon: Shona Needles, MD;  Location: [place];  Service: Orthopedics;  Laterality: Left;   LUMBAR LAMINECTOMY/DECOMPRESSION MICRODISCECTOMY N/A 07/21/2021   Procedure: Microlumbar decompression Lumbar Four-Five and Lumbar Five - Sacral One, excision of synovial cyst;  Surgeon: Susa Day, MD;  Location: [place];  Service: Orthopedics;  Laterality: N/A;   OOPHORECTOMY     ORIF ANKLE FRACTURE Left 06/24/2020   Procedure: OPEN REDUCTION INTERNAL FIXATION (ORIF) ANKLE FRACTURE;  Surgeon: Shona Needles, MD;  Location: [place];  Service: Orthopedics;  Laterality: Left;   Thyroidectomy  2006   TUBAL LIGATION     Patient Active Problem List   Diagnosis Date Noted   Cerebral edema ([place]) 06/26/2022   Compression of brain ([place])  06/26/2022   Intractable headache 06/26/2022   HTN (hypertension) 06/26/2022   Hypokalemia 06/26/2022   Hypocalcemia 06/26/2022   Hypothyroidism 06/26/2022   Intraparenchymal hemorrhage of brain ([place]) 06/20/2022   ICH (intracerebral hemorrhage) ([place]) 06/20/2022   Spinal stenosis at L4-L5 level 07/21/2021   Spinal stenosis of lumbar region 02/17/2021   Closed trimalleolar fracture of left ankle 06/22/2020   Special screening for malignant neoplasms, colon    Diverticulosis of colon without hemorrhage     ONSET DATE: 06/20/22  REFERRING DIAG: I61.9 (ICD-10-CM) - Intraparenchymal hemorrhage of brain (HCC) R53.1 (ICD-10-CM) - Weakness  THERAPY DIAG:  Difficulty walking  Cognitive communication deficit  Rationale for Evaluation and Treatment: Rehabilitation  SUBJECTIVE:  SUBJECTIVE STATEMENT: Patient reports no issues or pain today. States her bone scan came out good and instructed to continue taking her calcium.  EVALUATION: Patient has difficulty remembering things. Patient reports that her legs get tired easily especially when she has to go outside and do some grocery shopping. However, patient reports that the push carts are helping her when she gets her grocery. Patient states that she is slow and takes time when she walks. Patient denies any numbness on the legs. However, patient states that the L shin can be "tingly" at times and attributes it to the hx of ankle fx. Condition started on 06/20/22 when she had a bad HA and episodes of vomiting. Patient then went to the ER in [place] but was then taken to the hospital in [place]. Several imaging was done which revealed intraparenchymal hemorrhage of the brain. Patient stayed in the hospital for 3 days. Patient remembers that she received PT in the  hospital where she walked around the hallway. No rehab done upon hospital D/C. Patient then went to the neurologist as an outpatient and was then referred to PT evaluation and management. Patient was also referred to OT and SLP.  Pt accompanied by: self  PERTINENT HISTORY: HTN, lumbar stenosis, hx of L ankle fx  PAIN:  Are you having pain? No  PRECAUTIONS: Other: HTN  WEIGHT BEARING RESTRICTIONS: No  FALLS: Has patient fallen in last 6 months? No  LIVING ENVIRONMENT: Lives with: lives with their spouse Lives in: House/apartment Stairs: Yes: External: 2 steps; on right going up Has following equipment at home: Single point cane and Walker - 4 wheeled  PLOF: Independent  PATIENT GOALS: "to help me move better"  OBJECTIVE:   DIAGNOSTIC FINDINGS:  MRI head 06/20/22 IMPRESSION: 1. Redemonstrated hematoma centered in the left posterior temporal and lateral occipital region, with an approximate volume of 31 mL, likely unchanged compared to the prior CT when accounting for differences in technique. No abnormal enhancement to suggest active extravasation. 2. Mild surrounding edema with mass effect on the left lateral ventricle atrium, temporal horn, and occipital horn, with a small amount of left greater than right intraventricular extension.  COGNITION: Overall cognitive status: Within functional limits for tasks assessed   SENSATION: WFL  COORDINATION: No difficulty on heel-to-shin, alternating foot taps  MUSCLE TONE: B LE exhibit normal tone  MUSCLE LENGTH: Mild tightness on B hamstrings Moderate tightness on B gastrocsoleus   POSTURE: rounded shoulders, forward head, and decreased lumbar lordosis  LOWER EXTREMITY ROM:     Active  Right Eval Left Eval  Hip flexion [facility]  Hip extension    Hip abduction [facility]  Hip adduction    Hip internal rotation    Hip external rotation    Knee flexion [facility]  Knee extension [facility]  Ankle dorsiflexion [facility]   Ankle plantarflexion [facility] WFL  Ankle inversion    Ankle eversion     (Blank rows = not tested)  LOWER EXTREMITY MMT:    MMT Right Eval Left Eval  Hip flexion 4- 4  Hip extension    Hip abduction 4 4  Hip adduction    Hip internal rotation    Hip external rotation    Knee flexion 4- 4-  Knee extension 4- 4-  Ankle dorsiflexion 4+ 4+  Ankle plantarflexion 4+ 4+  Ankle inversion    Ankle eversion    (Blank rows = not tested)   TRANSFERS: Assistive device utilized: None  Sit  to stand: Complete Independence Stand to sit: Complete Independence  GAIT: Gait pattern: step through pattern, decreased arm swing- Right, decreased arm swing- Left, decreased stride length, decreased hip/knee flexion- Right, decreased hip/knee flexion- Left, decreased ankle dorsiflexion- Right, decreased ankle dorsiflexion- Left, trunk flexed, and narrow BOS Distance walked: 327 Assistive device utilized: None Level of assistance: Complete Independence   FUNCTIONAL TESTS:  Timed up and go (TUG): 8.50 2 minute walk test: 327 ft.  Rhomberg's test: slight to mild unsteadiness  Good static/dynamic standing balance  TODAY'S TREATMENT:                                                                                                                              DATE:  08/15/22 Nustep seat 5, level 3 x 5' Gastrocsoleus slant board stretch x 30" x 3 Standing on a counter/table/parallel bars, 2 x 10:  Marches x 2 lbs Mini squats x 5" hold  Hamstring curls x 2 lbs  Hip abduction 2lbs Vectors 1 HHA 10X5" holds each LE Tandem stance, eyes open, foam, 30" x 2 on each Cone rotation while standing on foam (10 cones Rt/Lt) SLS on foam each LE  08/10/22 Recumbent stepper, seat 5, level 3 x 5' Gastrocsoleus slant board stretch x 30" x 3 Standing on a counter/table/parallel bars, 2 x 10:  Marches x 2 lbs Mini squats x 6" hold  Heel raises x 2 lbs  Hip vector x 2 lbs  Hamstring curls x 2 lbs Tandem stance,  eyes open, foam, 30" x 2 on each Step up, 6" box x 10 x 2 each side Reaching in various direction, foam x 1' Forward stepping over cones x 5 ft x 2 rounds Seated:  Hamstring stretch x 30" x 3 LAQ x 3" x 10 x 2 x 2 lb  08/08/22 Nustep seat 5, level 2 x 5' UE/LE use Standing on a counter/table/parallel bars, 2 x 10 bilaterally:   Marches Mini squats x 6" hold   Heel raises/toeraises   Hip abduction    Hip extensions   Hamstring curls   Step up forward 6" box  Step up lateral 4" box Slant board stretch 3X30" Vectors 10X5" 1 UE assist each LE   08/03/22 Recumbent stepper, seat 5, level 1 x 5' Standing on a counter/table/parallel bars, 2 x 10:  Marches Mini squats x 6" hold  Heel raises  Hip vector  Hamstring curls  Step up, 4" box x 10 x 2 each side  Tandem stance, eyes open, firm surface, x 30" x 2 on each LE  Reaching in various direction, foam x 1' Seated: Gastrocsoleus stretch with a strap x 30" x 3 LAQ x 3" x 10 x 2  08/01/22 Evaluation and patient education Standing on a counter/table, 2 x 10:  Mini squats x 6" hold  Heel raises  Hip abd Seated: Gastrocsoleus stretch with a strap x 30" x 3    PATIENT EDUCATION: Education details:  Updated HEP Person educated: Patient Education method: Explanation, Demonstration, and Handouts Education comprehension: verbalized understanding and returned demonstration  HOME EXERCISE PROGRAM: Access Code: [email or web address] URL: [email or web address]  08/10/2022 - Seated Hamstring Stretch  - 1-2 x daily - 7 x weekly - 3 reps - 30 hold  Date: 08/01/2022 Prepared by: Rexene Alberts Exercises - Seated Calf Stretch with Strap  - 1-2 x daily - 7 x weekly - 3 reps - 30 hold - Mini Squat with Counter Support  - 1-2 x daily - 7 x weekly - 2 sets - 10 reps - 6 hold - Heel Raises with Counter Support  - 1-2 x daily - 7 x weekly - 2 sets - 10 reps - Standing Hip Abduction with Counter Support  - 1-2 x daily - 7 x weekly - 2 sets  - 10 reps  08/08/22 Vectors 10X5" bil LE  GOALS: Goals reviewed with patient? Yes  SHORT TERM GOALS: Target date: 08/15/22  Ptwill demonstrate indep in HEP to facilitate carry-over of skilled services and improve functional outcomes Goal status: IN PROGRESS   LONG TERM GOALS: Target date: 08/29/22  Pt will increase 2MWT by at least 40 ft in order to demonstrate clinically significant improvement in community ambulation Baseline: 327 ft Goal status: IN PROGRESS  2.  Pt will have a decrease in TUG score by at least 3 sec in order to demonstrate clinically significant improvement in community ambulation Baseline: 8.9 sec Goal status: IN PROGRESS  3.  Pt will demonstrate increase in LE strength to 4+ to facilitate ease and safety in ambulation Baseline: 4- Goal status: IN PROGRESS  ASSESSMENT:  CLINICAL IMPRESSION  Continued focus on improving LE flexibility, strength, and balance.  Vectors were most challenging for patient with 5 second holds.  Pt able to self correct LOB with cone rotation activity.  Tolerated all activities without c/o pain, minimal rest breaks or issues.  Cues continues to remain in correct form, isolating proper musculature.  Pt will continue to benefit from skilled therapy to address deficits and improve function.  Patient is a 77 y.o. female who was seen today for physical therapy evaluation and treatment for difficulty with walking. Patient was diagnosed with intraparenchymal hemorrhage of the brain by referring provider further defined by difficulty with grocery shopping and walking due to LE weakness and impaired proprioception. Skilled PT is required to address the impairments and functional limitations listed below. Patient will benefit from LE strengthening, balance and gait exercises.  OBJECTIVE IMPAIRMENTS: Abnormal gait, decreased balance, difficulty walking, decreased strength, and impaired flexibility.   ACTIVITY LIMITATIONS:   ambulation  PARTICIPATION LIMITATIONS: driving, shopping, and community activity  PERSONAL FACTORS: Age are also affecting patient's functional outcome.   REHAB POTENTIAL: Excellent  CLINICAL DECISION MAKING: Stable/uncomplicated  EVALUATION COMPLEXITY: Low  PLAN:  PT FREQUENCY: 2x/week  PT DURATION: 4 weeks  PLANNED INTERVENTIONS: Therapeutic exercises, Therapeutic activity, Neuromuscular re-education, Balance training, Gait training, Patient/Family education, Self Care, and Joint mobilization  PLAN FOR NEXT SESSION: Continue POC and may progress as tolerated with emphasis on LE strengthening and initiate proprioception/balance exercises.   08/15/2022, 10:49 AM Teena Irani, PTA/CLT [place] Ph: [phone]

## 2022-08-17 ENCOUNTER — Ambulatory Visit (HOSPITAL_COMMUNITY): Payer: Medicare PPO

## 2022-08-17 DIAGNOSIS — E7801 Familial hypercholesterolemia: Secondary | ICD-10-CM | POA: Diagnosis not present

## 2022-08-17 DIAGNOSIS — R262 Difficulty in walking, not elsewhere classified: Secondary | ICD-10-CM | POA: Diagnosis not present

## 2022-08-17 DIAGNOSIS — I10 Essential (primary) hypertension: Secondary | ICD-10-CM | POA: Diagnosis not present

## 2022-08-17 DIAGNOSIS — R41841 Cognitive communication deficit: Secondary | ICD-10-CM | POA: Diagnosis not present

## 2022-08-17 DIAGNOSIS — E7849 Other hyperlipidemia: Secondary | ICD-10-CM | POA: Diagnosis not present

## 2022-08-17 DIAGNOSIS — E039 Hypothyroidism, unspecified: Secondary | ICD-10-CM | POA: Diagnosis not present

## 2022-08-17 NOTE — Therapy (Addendum)
OUTPATIENT PHYSICAL THERAPY NEURO TREATMENT   Patient Name: Deborah Kerr MRN: [id number] DOB:July 17, 1945, 77 y.o., female Today's Date: 08/17/2022   PCP: Manon Hilding, MD  REFERRING PROVIDER: August Albino, NP  END OF SESSION:   PT End of Session - 08/17/22 1512     Visit Number 6    Number of Visits 8    Date for PT Re-Evaluation 08/29/22    Authorization Type [employer] (auth required -, no visit limits)    Progress Note Due on Visit 10    PT Start Time 1515    PT Stop Time 1600    PT Time Calculation (min) 45 min    Activity Tolerance Patient tolerated treatment well            Past Medical History:  Diagnosis Date   Arthritis    rheumatoid arthritis   Hypertension    Hypothyroidism 2009   Left bundle branch block    Thyroid disease    Past Surgical History:  Procedure Laterality Date   APPENDECTOMY     per patient 1970s   COLONOSCOPY     COLONOSCOPY N/A 05/18/2015   Procedure: COLONOSCOPY;  Surgeon: Daneil Dolin, MD;  [facility];  Service: Endoscopy;  Laterality: N/A;  11:30   EYE SURGERY Bilateral 2021   cataract surgery   HARDWARE REMOVAL Left 12/22/2020   Procedure: HARDWARE REMOVAL LEFT ANKLE;  Surgeon: Shona Needles, MD;  Location: [facility];  Service: Orthopedics;  Laterality: Left;   LUMBAR LAMINECTOMY/DECOMPRESSION MICRODISCECTOMY N/A 07/21/2021   Procedure: Microlumbar decompression Lumbar Four-Five and Lumbar Five - Sacral One, excision of synovial cyst;  Surgeon: Susa Day, MD;  Location: [place];  Service: Orthopedics;  Laterality: N/A;   OOPHORECTOMY     ORIF ANKLE FRACTURE Left 06/24/2020   Procedure: OPEN REDUCTION INTERNAL FIXATION (ORIF) ANKLE FRACTURE;  Surgeon: Shona Needles, MD;  Location: [facility];  Service: Orthopedics;  Laterality: Left;   Thyroidectomy  2006   TUBAL LIGATION     Patient Active Problem List   Diagnosis Date Noted   Cerebral edema ([place]) 06/26/2022   Compression of brain ([place]) 06/26/2022    Intractable headache 06/26/2022   HTN (hypertension) 06/26/2022   Hypokalemia 06/26/2022   Hypocalcemia 06/26/2022   Hypothyroidism 06/26/2022   Intraparenchymal hemorrhage of brain ([place]) 06/20/2022   ICH (intracerebral hemorrhage) ([place]) 06/20/2022   Spinal stenosis at L4-L5 level 07/21/2021   Spinal stenosis of lumbar region 02/17/2021   Closed trimalleolar fracture of left ankle 06/22/2020   Special screening for malignant neoplasms, colon    Diverticulosis of colon without hemorrhage     ONSET DATE: 06/20/22  REFERRING DIAG: I61.9 (ICD-10-CM) - Intraparenchymal hemorrhage of brain (HCC) R53.1 (ICD-10-CM) - Weakness  THERAPY DIAG:  Difficulty walking  Rationale for Evaluation and Treatment: Rehabilitation  SUBJECTIVE:  SUBJECTIVE STATEMENT: Patient is doing well today. Denies pain. Denies recent episode of falls. States that her balance is doing good.  EVALUATION: Patient has difficulty remembering things. Patient reports that her legs get tired easily especially when she has to go outside and do some grocery shopping. However, patient reports that the push carts are helping her when she gets her grocery. Patient states that she is slow and takes time when she walks. Patient denies any numbness on the legs. However, patient states that the L shin can be "tingly" at times and attributes it to the hx of ankle fx. Condition started on 06/20/22 when she had a bad HA and episodes of vomiting. Patient then went to the ER in [place] but was then taken to the hospital in [place]. Several imaging was done which revealed intraparenchymal hemorrhage of the brain. Patient stayed in the hospital for 3 days. Patient remembers that she received PT in the hospital where she walked around the hallway. No rehab  done upon hospital D/C. Patient then went to the neurologist as an outpatient and was then referred to PT evaluation and management. Patient was also referred to OT and SLP.  Pt accompanied by: self  PERTINENT HISTORY: HTN, lumbar stenosis, hx of L ankle fx  PAIN:  Are you having pain? No  PRECAUTIONS: Other: HTN  WEIGHT BEARING RESTRICTIONS: No  FALLS: Has patient fallen in last 6 months? No  LIVING ENVIRONMENT: Lives with: lives with their spouse Lives in: House/apartment Stairs: Yes: External: 2 steps; on right going up Has following equipment at home: Single point cane and Walker - 4 wheeled  PLOF: Independent  PATIENT GOALS: "to help me move better"  OBJECTIVE:   DIAGNOSTIC FINDINGS:  MRI head 06/20/22 IMPRESSION: 1. Redemonstrated hematoma centered in the left posterior temporal and lateral occipital region, with an approximate volume of 31 mL, likely unchanged compared to the prior CT when accounting for differences in technique. No abnormal enhancement to suggest active extravasation. 2. Mild surrounding edema with mass effect on the left lateral ventricle atrium, temporal horn, and occipital horn, with a small amount of left greater than right intraventricular extension.  COGNITION: Overall cognitive status: Within functional limits for tasks assessed   SENSATION: WFL  COORDINATION: No difficulty on heel-to-shin, alternating foot taps  MUSCLE TONE: B LE exhibit normal tone  MUSCLE LENGTH: Mild tightness on B hamstrings Moderate tightness on B gastrocsoleus   POSTURE: rounded shoulders, forward head, and decreased lumbar lordosis  LOWER EXTREMITY ROM:     Active  Right Eval Left Eval  Hip flexion [facility]  Hip extension    Hip abduction [facility]  Hip adduction    Hip internal rotation    Hip external rotation    Knee flexion [facility]  Knee extension [facility]  Ankle dorsiflexion [facility] WFL  Ankle plantarflexion [facility] WFL  Ankle inversion    Ankle  eversion     (Blank rows = not tested)  LOWER EXTREMITY MMT:    MMT Right Eval Left Eval  Hip flexion 4- 4  Hip extension    Hip abduction 4 4  Hip adduction    Hip internal rotation    Hip external rotation    Knee flexion 4- 4-  Knee extension 4- 4-  Ankle dorsiflexion 4+ 4+  Ankle plantarflexion 4+ 4+  Ankle inversion    Ankle eversion    (Blank rows = not tested)   TRANSFERS: Assistive device utilized: None  Sit to stand:  Complete Independence Stand to sit: Complete Independence  GAIT: Gait pattern: step through pattern, decreased arm swing- Right, decreased arm swing- Left, decreased stride length, decreased hip/knee flexion- Right, decreased hip/knee flexion- Left, decreased ankle dorsiflexion- Right, decreased ankle dorsiflexion- Left, trunk flexed, and narrow BOS Distance walked: 327 Assistive device utilized: None Level of assistance: Complete Independence   FUNCTIONAL TESTS:  Timed up and go (TUG): 8.50 2 minute walk test: 327 ft.  Rhomberg's test: slight to mild unsteadiness  Good static/dynamic standing balance  TODAY'S TREATMENT:                                                                                                                              DATE:  08/17/22 Recumbent stepper, seat 5, level 3 x 5' Gastrocsoleus slant board stretch x 30" x 3 Standing on a counter/table/parallel bars, 2 x 10:  Marches x 3 lbs Mini squats x 6" hold  Heel raises x 3 lbs  Hip vector x 3 lbs  Hamstring curls x 3 lbs Tandem stance, eyes open, foam, 30" x 2 on each Forward Step up, 6" box x 10 x 2 each side Reaching in various direction, foam x 1' Forward stepping over cones x 5 ft x 2 rounds Seated:  Hamstring stretch x 30" x 3 LAQ x 3" x 10 x 2 x 3 lb  08/15/22 Nustep seat 5, level 3 x 5' Gastrocsoleus slant board stretch x 30" x 3 Standing on a counter/table/parallel bars, 2 x 10:  Marches x 2 lbs Mini squats x 5" hold  Hamstring curls x 2 lbs  Hip  abduction 2lbs Vectors 1 HHA 10X5" holds each LE Tandem stance, eyes open, foam, 30" x 2 on each Cone rotation while standing on foam (10 cones Rt/Lt) SLS on foam each LE  08/10/22 Recumbent stepper, seat 5, level 3 x 5' Gastrocsoleus slant board stretch x 30" x 3 Standing on a counter/table/parallel bars, 2 x 10:  Marches x 2 lbs Mini squats x 6" hold  Heel raises x 2 lbs  Hip vector x 2 lbs  Hamstring curls x 2 lbs Tandem stance, eyes open, foam, 30" x 2 on each Step up, 6" box x 10 x 2 each side Reaching in various direction, foam x 1' Forward stepping over cones x 5 ft x 2 rounds Seated:  Hamstring stretch x 30" x 3 LAQ x 3" x 10 x 2 x 2 lb  08/08/22 Nustep seat 5, level 2 x 5' UE/LE use Standing on a counter/table/parallel bars, 2 x 10 bilaterally:   Marches Mini squats x 6" hold   Heel raises/toeraises   Hip abduction    Hip extensions   Hamstring curls   Step up forward 6" box  Step up lateral 4" box Slant board stretch 3X30" Vectors 10X5" 1 UE assist each LE   08/03/22 Recumbent stepper, seat 5, level 1 x 5' Standing on a counter/table/parallel bars, 2 x 10:  Marches Mini squats x 6" hold  Heel raises  Hip vector  Hamstring curls  Step up, 4" box x 10 x 2 each side  Tandem stance, eyes open, firm surface, x 30" x 2 on each LE  Reaching in various direction, foam x 1' Seated: Gastrocsoleus stretch with a strap x 30" x 3 LAQ x 3" x 10 x 2  08/01/22 Evaluation and patient education Standing on a counter/table, 2 x 10:  Mini squats x 6" hold  Heel raises  Hip abd Seated: Gastrocsoleus stretch with a strap x 30" x 3    PATIENT EDUCATION: Education details: Updated HEP Person educated: Patient Education method: Explanation, Demonstration, and Handouts Education comprehension: verbalized understanding and returned demonstration  HOME EXERCISE PROGRAM: Access Code: [email or web address] URL: [email or web address]  08/10/2022 - Seated Hamstring  Stretch  - 1-2 x daily - 7 x weekly - 3 reps - 30 hold  Date: 08/01/2022 Prepared by: Rexene Alberts Exercises - Seated Calf Stretch with Strap  - 1-2 x daily - 7 x weekly - 3 reps - 30 hold - Mini Squat with Counter Support  - 1-2 x daily - 7 x weekly - 2 sets - 10 reps - 6 hold - Heel Raises with Counter Support  - 1-2 x daily - 7 x weekly - 2 sets - 10 reps - Standing Hip Abduction with Counter Support  - 1-2 x daily - 7 x weekly - 2 sets - 10 reps  08/08/22 Vectors 10X5" bil LE  GOALS: Goals reviewed with patient? Yes  SHORT TERM GOALS: Target date: 08/15/22  Ptwill demonstrate indep in HEP to facilitate carry-over of skilled services and improve functional outcomes Goal status: IN PROGRESS   LONG TERM GOALS: Target date: 08/29/22  Pt will increase 2MWT by at least 40 ft in order to demonstrate clinically significant improvement in community ambulation Baseline: 327 ft Goal status: IN PROGRESS  2.  Pt will have a decrease in TUG score by at least 3 sec in order to demonstrate clinically significant improvement in community ambulation Baseline: 8.9 sec Goal status: IN PROGRESS  3.  Pt will demonstrate increase in LE strength to 4+ to facilitate ease and safety in ambulation Baseline: 4- Goal status: IN PROGRESS  ASSESSMENT:  CLINICAL IMPRESSION  Interventions today were geared towards LE flexibility, strengthening and balance. Demonstrated appropriate levels of fatigue. Rest periods provided. Mild unsteadiness noted during stepping over cones, tandem stance, and reaching due to impaired proprioception. Required slight amount of cueing to ensure correct execution of activity. To date, skilled PT is required to address the impairments and improve function.  Patient is a 77 y.o. female who was seen today for physical therapy evaluation and treatment for difficulty with walking. Patient was diagnosed with intraparenchymal hemorrhage of the brain by referring provider further  defined by difficulty with grocery shopping and walking due to LE weakness and impaired proprioception. Skilled PT is required to address the impairments and functional limitations listed below. Patient will benefit from LE strengthening, balance and gait exercises.  OBJECTIVE IMPAIRMENTS: Abnormal gait, decreased balance, difficulty walking, decreased strength, and impaired flexibility.   ACTIVITY LIMITATIONS:  ambulation  PARTICIPATION LIMITATIONS: driving, shopping, and community activity  PERSONAL FACTORS: Age are also affecting patient's functional outcome.   REHAB POTENTIAL: Excellent  CLINICAL DECISION MAKING: Stable/uncomplicated  EVALUATION COMPLEXITY: Low  PLAN:  PT FREQUENCY: 2x/week  PT DURATION: 4 weeks  PLANNED INTERVENTIONS: Therapeutic exercises, Therapeutic activity, Neuromuscular re-education, Balance training, Gait training, Patient/Family education, Self  Care, and Joint mobilization  PLAN FOR NEXT SESSION: Continue POC and may progress as tolerated with emphasis on LE strengthening and initiate proprioception/balance exercises.   Harvie Heck. Samik Balkcom, PT, DPT, OCS Board-Certified Clinical Specialist in [place] # ([place]): [phone] T 08/17/2022, 5:15 PM

## 2022-08-22 ENCOUNTER — Ambulatory Visit (HOSPITAL_COMMUNITY): Payer: Medicare PPO

## 2022-08-22 DIAGNOSIS — R262 Difficulty in walking, not elsewhere classified: Secondary | ICD-10-CM

## 2022-08-22 DIAGNOSIS — R41841 Cognitive communication deficit: Secondary | ICD-10-CM | POA: Diagnosis not present

## 2022-08-22 NOTE — Therapy (Addendum)
OUTPATIENT PHYSICAL THERAPY NEURO TREATMENT   Patient Name: Deborah Kerr MRN: [id number] DOB:1946/05/06, 77 y.o., female Today's Date: 08/22/2022   PCP: Manon Hilding, MD  REFERRING PROVIDER: August Albino, NP  END OF SESSION:   PT End of Session - 08/22/22 1109     Visit Number 7    Number of Visits 8    Date for PT Re-Evaluation 08/29/22    Authorization Type [employer]    Authorization Time Period 1-30/24-08/29/22    Authorization - Visit Number 7    Authorization - Number of Visits 8    Progress Note Due on Visit 10    PT Start Time 1110    PT Stop Time 1155    PT Time Calculation (min) 45 min    Equipment Utilized During Treatment Gait belt    Activity Tolerance Patient tolerated treatment well    Behavior During Therapy [facility] for tasks assessed/performed            Past Medical History:  Diagnosis Date   Arthritis    rheumatoid arthritis   Hypertension    Hypothyroidism 2009   Left bundle branch block    Thyroid disease    Past Surgical History:  Procedure Laterality Date   APPENDECTOMY     per patient 1970s   COLONOSCOPY     COLONOSCOPY N/A 05/18/2015   Procedure: COLONOSCOPY;  Surgeon: Daneil Dolin, MD;  [facility];  Service: Endoscopy;  Laterality: N/A;  11:30   EYE SURGERY Bilateral 2021   cataract surgery   HARDWARE REMOVAL Left 12/22/2020   Procedure: HARDWARE REMOVAL LEFT ANKLE;  Surgeon: Shona Needles, MD;  Location: [facility];  Service: Orthopedics;  Laterality: Left;   LUMBAR LAMINECTOMY/DECOMPRESSION MICRODISCECTOMY N/A 07/21/2021   Procedure: Microlumbar decompression Lumbar Four-Five and Lumbar Five - Sacral One, excision of synovial cyst;  Surgeon: Susa Day, MD;  Location: [facility];  Service: Orthopedics;  Laterality: N/A;   OOPHORECTOMY     ORIF ANKLE FRACTURE Left 06/24/2020   Procedure: OPEN REDUCTION INTERNAL FIXATION (ORIF) ANKLE FRACTURE;  Surgeon: Shona Needles, MD;  Location: [facility];  Service: Orthopedics;   Laterality: Left;   Thyroidectomy  2006   TUBAL LIGATION     Patient Active Problem List   Diagnosis Date Noted   Cerebral edema ([place]) 06/26/2022   Compression of brain ([place]) 06/26/2022   Intractable headache 06/26/2022   HTN (hypertension) 06/26/2022   Hypokalemia 06/26/2022   Hypocalcemia 06/26/2022   Hypothyroidism 06/26/2022   Intraparenchymal hemorrhage of brain ([place]) 06/20/2022   ICH (intracerebral hemorrhage) ([place]) 06/20/2022   Spinal stenosis at L4-L5 level 07/21/2021   Spinal stenosis of lumbar region 02/17/2021   Closed trimalleolar fracture of left ankle 06/22/2020   Special screening for malignant neoplasms, colon    Diverticulosis of colon without hemorrhage     ONSET DATE: 06/20/22  REFERRING DIAG: I61.9 (ICD-10-CM) - Intraparenchymal hemorrhage of brain (HCC) R53.1 (ICD-10-CM) - Weakness  THERAPY DIAG:  Difficulty walking  Rationale for Evaluation and Treatment: Rehabilitation  SUBJECTIVE:  SUBJECTIVE STATEMENT: Patient is doing well today. Denies pain. Denies recent episode of falls. Patient states that her legs can get tired at times.  EVALUATION: Patient has difficulty remembering things. Patient reports that her legs get tired easily especially when she has to go outside and do some grocery shopping. However, patient reports that the push carts are helping her when she gets her grocery. Patient states that she is slow and takes time when she walks. Patient denies any numbness on the legs. However, patient states that the L shin can be "tingly" at times and attributes it to the hx of ankle fx. Condition started on 06/20/22 when she had a bad HA and episodes of vomiting. Patient then went to the ER in [place] but was then taken to the hospital in [place]. Several imaging  was done which revealed intraparenchymal hemorrhage of the brain. Patient stayed in the hospital for 3 days. Patient remembers that she received PT in the hospital where she walked around the hallway. No rehab done upon hospital D/C. Patient then went to the neurologist as an outpatient and was then referred to PT evaluation and management. Patient was also referred to OT and SLP.  Pt accompanied by: self  PERTINENT HISTORY: HTN, lumbar stenosis, hx of L ankle fx  PAIN:  Are you having pain? No  PRECAUTIONS: Other: HTN  WEIGHT BEARING RESTRICTIONS: No  FALLS: Has patient fallen in last 6 months? No  LIVING ENVIRONMENT: Lives with: lives with their spouse Lives in: House/apartment Stairs: Yes: External: 2 steps; on right going up Has following equipment at home: Single point cane and Walker - 4 wheeled  PLOF: Independent  PATIENT GOALS: "to help me move better"  OBJECTIVE:   DIAGNOSTIC FINDINGS:  MRI head 06/20/22 IMPRESSION: 1. Redemonstrated hematoma centered in the left posterior temporal and lateral occipital region, with an approximate volume of 31 mL, likely unchanged compared to the prior CT when accounting for differences in technique. No abnormal enhancement to suggest active extravasation. 2. Mild surrounding edema with mass effect on the left lateral ventricle atrium, temporal horn, and occipital horn, with a small amount of left greater than right intraventricular extension.  COGNITION: Overall cognitive status: Within functional limits for tasks assessed   SENSATION: WFL  COORDINATION: No difficulty on heel-to-shin, alternating foot taps  MUSCLE TONE: B LE exhibit normal tone  MUSCLE LENGTH: Mild tightness on B hamstrings Moderate tightness on B gastrocsoleus   POSTURE: rounded shoulders, forward head, and decreased lumbar lordosis  LOWER EXTREMITY ROM:     Active  Right Eval Left Eval  Hip flexion [facility]  Hip extension    Hip abduction [facility]  [facility]  Hip adduction    Hip internal rotation    Hip external rotation    Knee flexion [facility]  Knee extension [facility]  Ankle dorsiflexion [facility]  Ankle plantarflexion [facility] WFL  Ankle inversion    Ankle eversion     (Blank rows = not tested)  LOWER EXTREMITY MMT:    MMT Right Eval Left Eval  Hip flexion 4- 4  Hip extension    Hip abduction 4 4  Hip adduction    Hip internal rotation    Hip external rotation    Knee flexion 4- 4-  Knee extension 4- 4-  Ankle dorsiflexion 4+ 4+  Ankle plantarflexion 4+ 4+  Ankle inversion    Ankle eversion    (Blank rows = not tested)   TRANSFERS: Assistive device utilized: None  Sit to stand: Complete Independence Stand to sit: Complete Independence  GAIT: Gait pattern: step through pattern, decreased arm swing- Right, decreased arm swing- Left, decreased stride length, decreased hip/knee flexion- Right, decreased hip/knee flexion- Left, decreased ankle dorsiflexion- Right, decreased ankle dorsiflexion- Left, trunk flexed, and narrow BOS Distance walked: 327 Assistive device utilized: None Level of assistance: Complete Independence   FUNCTIONAL TESTS:  Timed up and go (TUG): 8.50 2 minute walk test: 327 ft.  Rhomberg's test: slight to mild unsteadiness  Good static/dynamic standing balance  TODAY'S TREATMENT:                                                                                                                              DATE:  08/22/22 Recumbent stepper, seat 5, level 3 x 5' Gastrocsoleus slant board stretch x 30" x 3 Standing on a counter/table/parallel bars, 2 x 10:  Marches x 3.5 lbs Mini squats x 6" hold  Heel raises x 3.5 lbs  Hip vector x 3.5 lbs  Hamstring curls x 3.5 lbs  Tandem stance, eyes closed, foam, 30" x 2 on each Forward Step up, 7" step (on the stairs) x 10 x 2 each side Reaching in various direction, foam x 1' Forward stepping over cones x 5 ft x 2 rounds x 2 lbs Seated:  Hamstring stretch x  30" x 3 LAQ x 3" x 10 x 2 x 3.5 lb  08/17/22 Recumbent stepper, seat 5, level 3 x 5' Gastrocsoleus slant board stretch x 30" x 3 Standing on a counter/table/parallel bars, 2 x 10:  Marches x 3 lbs Mini squats x 6" hold  Heel raises x 3 lbs  Hip vector x 3 lbs  Hamstring curls x 3 lbs Tandem stance, eyes open, foam, 30" x 2 on each Forward Step up, 6" box x 10 x 2 each side Reaching in various direction, foam x 1' Forward stepping over cones x 5 ft x 2 rounds Seated:  Hamstring stretch x 30" x 3 LAQ x 3" x 10 x 2 x 3 lb  08/15/22 Nustep seat 5, level 3 x 5' Gastrocsoleus slant board stretch x 30" x 3 Standing on a counter/table/parallel bars, 2 x 10:  Marches x 2 lbs Mini squats x 5" hold  Hamstring curls x 2 lbs  Hip abduction 2lbs Vectors 1 HHA 10X5" holds each LE Tandem stance, eyes open, foam, 30" x 2 on each Cone rotation while standing on foam (10 cones Rt/Lt) SLS on foam each LE  08/10/22 Recumbent stepper, seat 5, level 3 x 5' Gastrocsoleus slant board stretch x 30" x 3 Standing on a counter/table/parallel bars, 2 x 10:  Marches x 2 lbs Mini squats x 6" hold  Heel raises x 2 lbs  Hip vector x 2 lbs  Hamstring curls x 2 lbs Tandem stance, eyes open, foam, 30" x 2 on each Step up, 6" box x 10 x 2 each side Reaching in various  direction, foam x 1' Forward stepping over cones x 5 ft x 2 rounds Seated:  Hamstring stretch x 30" x 3 LAQ x 3" x 10 x 2 x 2 lb  08/08/22 Nustep seat 5, level 2 x 5' UE/LE use Standing on a counter/table/parallel bars, 2 x 10 bilaterally:   Marches Mini squats x 6" hold   Heel raises/toeraises   Hip abduction    Hip extensions   Hamstring curls   Step up forward 6" box  Step up lateral 4" box Slant board stretch 3X30" Vectors 10X5" 1 UE assist each LE   08/03/22 Recumbent stepper, seat 5, level 1 x 5' Standing on a counter/table/parallel bars, 2 x 10:  Marches Mini squats x 6" hold  Heel raises  Hip vector  Hamstring  curls  Step up, 4" box x 10 x 2 each side  Tandem stance, eyes open, firm surface, x 30" x 2 on each LE  Reaching in various direction, foam x 1' Seated: Gastrocsoleus stretch with a strap x 30" x 3 LAQ x 3" x 10 x 2  08/01/22 Evaluation and patient education Standing on a counter/table, 2 x 10:  Mini squats x 6" hold  Heel raises  Hip abd Seated: Gastrocsoleus stretch with a strap x 30" x 3    PATIENT EDUCATION: Education details: Updated HEP Person educated: Patient Education method: Explanation, Demonstration, and Handouts Education comprehension: verbalized understanding and returned demonstration  HOME EXERCISE PROGRAM: Access Code: [email or web address] URL: [email or web address]  08/10/2022 - Seated Hamstring Stretch  - 1-2 x daily - 7 x weekly - 3 reps - 30 hold  Date: 08/01/2022 Prepared by: Rexene Alberts Exercises - Seated Calf Stretch with Strap  - 1-2 x daily - 7 x weekly - 3 reps - 30 hold - Mini Squat with Counter Support  - 1-2 x daily - 7 x weekly - 2 sets - 10 reps - 6 hold - Heel Raises with Counter Support  - 1-2 x daily - 7 x weekly - 2 sets - 10 reps - Standing Hip Abduction with Counter Support  - 1-2 x daily - 7 x weekly - 2 sets - 10 reps  08/08/22 Vectors 10X5" bil LE  GOALS: Goals reviewed with patient? Yes  SHORT TERM GOALS: Target date: 08/15/22  Ptwill demonstrate indep in HEP to facilitate carry-over of skilled services and improve functional outcomes Goal status: IN PROGRESS   LONG TERM GOALS: Target date: 08/29/22  Pt will increase 2MWT by at least 40 ft in order to demonstrate clinically significant improvement in community ambulation Baseline: 327 ft Goal status: IN PROGRESS  2.  Pt will have a decrease in TUG score by at least 3 sec in order to demonstrate clinically significant improvement in community ambulation Baseline: 8.9 sec Goal status: IN PROGRESS  3.  Pt will demonstrate increase in LE strength to 4+ to facilitate  ease and safety in ambulation Baseline: 4- Goal status: IN PROGRESS  ASSESSMENT:  CLINICAL IMPRESSION  Interventions today were geared towards LE flexibility, strengthening and balance. Demonstrated appropriate levels of fatigue. Rest periods still provided. Still noted mild unsteadiness noted during stepping over cones, tandem stance, and reaching due to impaired proprioception. Required slight amount of cueing to ensure correct execution of activity. To date, skilled PT is required to address the impairments and improve function.  Patient is a 77 y.o. female who was seen today for physical therapy evaluation and treatment for difficulty with walking. Patient was diagnosed with intraparenchymal hemorrhage  of the brain by referring provider further defined by difficulty with grocery shopping and walking due to LE weakness and impaired proprioception. Skilled PT is required to address the impairments and functional limitations listed below. Patient will benefit from LE strengthening, balance and gait exercises.  OBJECTIVE IMPAIRMENTS: Abnormal gait, decreased balance, difficulty walking, decreased strength, and impaired flexibility.   ACTIVITY LIMITATIONS:  ambulation  PARTICIPATION LIMITATIONS: driving, shopping, and community activity  PERSONAL FACTORS: Age are also affecting patient's functional outcome.   REHAB POTENTIAL: Excellent  CLINICAL DECISION MAKING: Stable/uncomplicated  EVALUATION COMPLEXITY: Low  PLAN:  PT FREQUENCY: 2x/week  PT DURATION: 4 weeks  PLANNED INTERVENTIONS: Therapeutic exercises, Therapeutic activity, Neuromuscular re-education, Balance training, Gait training, Patient/Family education, Self Care, and Joint mobilization  PLAN FOR NEXT SESSION: Reassess next visit. May progress as tolerated with emphasis on LE strengthening and initiate proprioception/balance exercises.   Harvie Heck. Jaevian Shean, PT, DPT, OCS Board-Certified Clinical Specialist in [place] # ([place]): [phone] T 08/22/2022, 11:29 AM

## 2022-08-24 ENCOUNTER — Ambulatory Visit (HOSPITAL_COMMUNITY): Payer: Medicare PPO

## 2022-08-24 DIAGNOSIS — I619 Nontraumatic intracerebral hemorrhage, unspecified: Secondary | ICD-10-CM | POA: Diagnosis not present

## 2022-08-24 DIAGNOSIS — E7849 Other hyperlipidemia: Secondary | ICD-10-CM | POA: Diagnosis not present

## 2022-08-24 DIAGNOSIS — I10 Essential (primary) hypertension: Secondary | ICD-10-CM | POA: Diagnosis not present

## 2022-08-24 DIAGNOSIS — D709 Neutropenia, unspecified: Secondary | ICD-10-CM | POA: Diagnosis not present

## 2022-08-24 DIAGNOSIS — M05719 Rheumatoid arthritis with rheumatoid factor of unspecified shoulder without organ or systems involvement: Secondary | ICD-10-CM | POA: Diagnosis not present

## 2022-08-24 DIAGNOSIS — Z6821 Body mass index (BMI) 21.0-21.9, adult: Secondary | ICD-10-CM | POA: Diagnosis not present

## 2022-08-24 DIAGNOSIS — E039 Hypothyroidism, unspecified: Secondary | ICD-10-CM | POA: Diagnosis not present

## 2022-08-24 DIAGNOSIS — M0579 Rheumatoid arthritis with rheumatoid factor of multiple sites without organ or systems involvement: Secondary | ICD-10-CM | POA: Diagnosis not present

## 2022-08-25 ENCOUNTER — Ambulatory Visit (HOSPITAL_COMMUNITY): Payer: Medicare PPO

## 2022-08-25 DIAGNOSIS — I10 Essential (primary) hypertension: Secondary | ICD-10-CM | POA: Diagnosis not present

## 2022-08-25 DIAGNOSIS — E7849 Other hyperlipidemia: Secondary | ICD-10-CM | POA: Diagnosis not present

## 2022-08-25 DIAGNOSIS — Z0001 Encounter for general adult medical examination with abnormal findings: Secondary | ICD-10-CM | POA: Diagnosis not present

## 2022-08-25 DIAGNOSIS — Z6821 Body mass index (BMI) 21.0-21.9, adult: Secondary | ICD-10-CM | POA: Diagnosis not present

## 2022-08-25 DIAGNOSIS — F17210 Nicotine dependence, cigarettes, uncomplicated: Secondary | ICD-10-CM | POA: Diagnosis not present

## 2022-08-25 DIAGNOSIS — E039 Hypothyroidism, unspecified: Secondary | ICD-10-CM | POA: Diagnosis not present

## 2022-08-29 ENCOUNTER — Ambulatory Visit (HOSPITAL_COMMUNITY): Payer: Medicare PPO

## 2022-08-29 DIAGNOSIS — R262 Difficulty in walking, not elsewhere classified: Secondary | ICD-10-CM

## 2022-08-29 DIAGNOSIS — R41841 Cognitive communication deficit: Secondary | ICD-10-CM | POA: Diagnosis not present

## 2022-08-29 NOTE — Addendum Note (Signed)
Addended by: Marda Stalker on: 08/29/2022 05:26 PM   Modules accepted: Orders

## 2022-08-29 NOTE — Therapy (Addendum)
OUTPATIENT PHYSICAL THERAPY NEURO TREATMENT/PROGRESS/DISCHARGE NOTE    Patient Name: Deborah Kerr MRN: [id number] DOB:29-Nov-1945, 77 y.o., female Today's Date: 08/29/2022  PHYSICAL THERAPY DISCHARGE SUMMARY  Visits from Start of Care: 8  Current functional level related to goals / functional outcomes: Please see below   Remaining deficits: Please see below   Education / Equipment: Updated HEP   Patient agrees to discharge. Patient goals were  majority of goals being met . Patient is being discharged due to  majority of goals met.   PCP: Manon Hilding, MD  REFERRING PROVIDER: August Albino, NP  END OF SESSION:   PT End of Session - 08/29/22 1121     Visit Number 8    Number of Visits 8    Date for PT Re-Evaluation 08/29/22    Authorization Type [employer]    Authorization Time Period 1-30/24-08/29/22    Authorization - Visit Number 8    Authorization - Number of Visits 8    PT Start Time 1115    PT Stop Time 1155    PT Time Calculation (min) 40 min    Activity Tolerance Patient tolerated treatment well            Past Medical History:  Diagnosis Date   Arthritis    rheumatoid arthritis   Hypertension    Hypothyroidism 2009   Left bundle branch block    Thyroid disease    Past Surgical History:  Procedure Laterality Date   APPENDECTOMY     per patient 1970s   COLONOSCOPY     COLONOSCOPY N/A 05/18/2015   Procedure: COLONOSCOPY;  Surgeon: Daneil Dolin, MD;  [facility];  Service: Endoscopy;  Laterality: N/A;  11:30   EYE SURGERY Bilateral 2021   cataract surgery   HARDWARE REMOVAL Left 12/22/2020   Procedure: HARDWARE REMOVAL LEFT ANKLE;  Surgeon: Shona Needles, MD;  Location: [facility];  Service: Orthopedics;  Laterality: Left;   LUMBAR LAMINECTOMY/DECOMPRESSION MICRODISCECTOMY N/A 07/21/2021   Procedure: Microlumbar decompression Lumbar Four-Five and Lumbar Five - Sacral One, excision of synovial cyst;  Surgeon: Susa Day,  MD;  Location: [facility];  Service: Orthopedics;  Laterality: N/A;   OOPHORECTOMY     ORIF ANKLE FRACTURE Left 06/24/2020   Procedure: OPEN REDUCTION INTERNAL FIXATION (ORIF) ANKLE FRACTURE;  Surgeon: Shona Needles, MD;  Location: [facility];  Service: Orthopedics;  Laterality: Left;   Thyroidectomy  2006   TUBAL LIGATION     Patient Active Problem List   Diagnosis Date Noted   Cerebral edema ([place]) 06/26/2022   Compression of brain ([place]) 06/26/2022   Intractable headache 06/26/2022   HTN (hypertension) 06/26/2022   Hypokalemia 06/26/2022   Hypocalcemia 06/26/2022   Hypothyroidism 06/26/2022   Intraparenchymal hemorrhage of brain ([place]) 06/20/2022   ICH (intracerebral hemorrhage) ([place]) 06/20/2022   Spinal stenosis at L4-L5 level 07/21/2021   Spinal stenosis of lumbar region 02/17/2021   Closed trimalleolar fracture of left ankle 06/22/2020   Special screening for malignant neoplasms, colon    Diverticulosis of colon without hemorrhage     ONSET DATE: 06/20/22  REFERRING DIAG: I61.9 (ICD-10-CM) - Intraparenchymal hemorrhage of brain (HCC) R53.1 (ICD-10-CM) - Weakness  THERAPY DIAG:  No diagnosis found.  Rationale for Evaluation and Treatment: Rehabilitation  SUBJECTIVE:  SUBJECTIVE STATEMENT: Patient is doing well today. Denies pain. Denies recent episode of falls. Patient states that her legs can get tired at times and at times, she can get unsteady. Patient states that PT has helped her a lot and thinks that she's done with PT. Patient states that she's been doing her HEP and thinks that those help too.  EVALUATION: Patient has difficulty remembering things. Patient reports that her legs get tired easily especially when she has to go outside and do some grocery shopping. However, patient reports that  the push carts are helping her when she gets her grocery. Patient states that she is slow and takes time when she walks. Patient denies any numbness on the legs. However, patient states that the L shin can be "tingly" at times and attributes it to the hx of ankle fx. Condition started on 06/20/22 when she had a bad HA and episodes of vomiting. Patient then went to the ER in [place] but was then taken to the hospital in [place]. Several imaging was done which revealed intraparenchymal hemorrhage of the brain. Patient stayed in the hospital for 3 days. Patient remembers that she received PT in the hospital where she walked around the hallway. No rehab done upon hospital D/C. Patient then went to the neurologist as an outpatient and was then referred to PT evaluation and management. Patient was also referred to OT and SLP.  Pt accompanied by: self  PERTINENT HISTORY: HTN, lumbar stenosis, hx of L ankle fx  PAIN:  Are you having pain? No  PRECAUTIONS: Other: HTN  WEIGHT BEARING RESTRICTIONS: No  FALLS: Has patient fallen in last 6 months? No  LIVING ENVIRONMENT: Lives with: lives with their spouse Lives in: House/apartment Stairs: Yes: External: 2 steps; on right going up Has following equipment at home: Single point cane and Walker - 4 wheeled  PLOF: Independent  PATIENT GOALS: "to help me move better"  OBJECTIVE:   DIAGNOSTIC FINDINGS:  MRI head 06/20/22 IMPRESSION: 1. Redemonstrated hematoma centered in the left posterior temporal and lateral occipital region, with an approximate volume of 31 mL, likely unchanged compared to the prior CT when accounting for differences in technique. No abnormal enhancement to suggest active extravasation. 2. Mild surrounding edema with mass effect on the left lateral ventricle atrium, temporal horn, and occipital horn, with a small amount of left greater than right intraventricular extension.  COGNITION: Overall cognitive status: Within  functional limits for tasks assessed   SENSATION: Not tested  COORDINATION: No difficulty on heel-to-shin, alternating foot taps  MUSCLE TONE: B LE exhibit normal tone  MUSCLE LENGTH: Mild tightness on B hamstrings Mild tightness on B gastrocsoleus   POSTURE: rounded shoulders, forward head, and decreased lumbar lordosis  LOWER EXTREMITY ROM:     Active  Right Eval and on 2/27 Left Eval And on 2/27  Hip flexion [facility]  Hip extension    Hip abduction [facility]  Hip adduction    Hip internal rotation    Hip external rotation    Knee flexion [facility]  Knee extension [facility]  Ankle dorsiflexion [facility]  Ankle plantarflexion [facility]  Ankle inversion    Ankle eversion     (Blank rows = not tested)  LOWER EXTREMITY MMT:    MMT Right Eval Left Eval Right 2/27 Left 2/27  Hip flexion 4- 4 4+ 4+  Hip extension      Hip abduction 4 4 4+ 4+  Hip adduction  Hip internal rotation      Hip external rotation      Knee flexion 4- 4- 4+ 4+  Knee extension 4- 4- 4+ 4+  Ankle dorsiflexion 4+ 4+ 4+ 4+  Ankle plantarflexion 4+ 4+ 4+ 4+  Ankle inversion      Ankle eversion      (Blank rows = not tested)   TRANSFERS: Assistive device utilized: None  Sit to stand: Complete Independence Stand to sit: Complete Independence  GAIT: Gait pattern: step through pattern, decreased hip/knee flexion- Right, decreased hip/knee flexion- Left, decreased ankle dorsiflexion- Right, decreased ankle dorsiflexion- Left, trunk flexed, and narrow BOS Distance walked: 390 ft Assistive device utilized: None Level of assistance: Complete Independence   FUNCTIONAL TESTS:  Timed up and go (TUG): 8.01 sec from 8.50 sec 2 minute walk test: 390 ft from 327 ft.  Rhomberg's test: slight to mild unsteadiness  Good static/dynamic standing balance  TODAY'S TREATMENT:                                                                                                                              DATE:   08/29/22 Progress evaluation Gastrocsoleus slant board stretch x 30" x 3 Standing on a counter/table/parallel bars, x 10:  Marches x 3.5 lbs Mini squats x 6" hold  Heel raises x 3.5 lbs  Hip vector x 3.5 lbs  Hamstring curls x 3.5 lbs  Tandem stance, eyes open, firm surface, 30" x 2 on each Seated:  Hamstring stretch x 30" LAQ x 3" x 10 x 3.5 lb  08/22/22 Recumbent stepper, seat 5, level 3 x 5' Gastrocsoleus slant board stretch x 30" x 3 Standing on a counter/table/parallel bars, 2 x 10:  Marches x 3.5 lbs Mini squats x 6" hold  Heel raises x 3.5 lbs  Hip vector x 3.5 lbs  Hamstring curls x 3.5 lbs  Tandem stance, eyes closed, foam, 30" x 2 on each Forward Step up, 7" step (on the stairs) x 10 x 2 each side Reaching in various direction, foam x 1' Forward stepping over cones x 5 ft x 2 rounds x 2 lbs Seated:  Hamstring stretch x 30" x 3 LAQ x 3" x 10 x 2 x 3.5 lb  08/17/22 Recumbent stepper, seat 5, level 3 x 5' Gastrocsoleus slant board stretch x 30" x 3 Standing on a counter/table/parallel bars, 2 x 10:  Marches x 3 lbs Mini squats x 6" hold  Heel raises x 3 lbs  Hip vector x 3 lbs  Hamstring curls x 3 lbs Tandem stance, eyes open, foam, 30" x 2 on each Forward Step up, 6" box x 10 x 2 each side Reaching in various direction, foam x 1' Forward stepping over cones x 5 ft x 2 rounds Seated:  Hamstring stretch x 30" x 3 LAQ x 3" x 10 x 2 x 3 lb  08/15/22 Nustep seat 5, level 3 x 5' Gastrocsoleus slant board  stretch x 30" x 3 Standing on a counter/table/parallel bars, 2 x 10:  Marches x 2 lbs Mini squats x 5" hold  Hamstring curls x 2 lbs  Hip abduction 2lbs Vectors 1 HHA 10X5" holds each LE Tandem stance, eyes open, foam, 30" x 2 on each Cone rotation while standing on foam (10 cones Rt/Lt) SLS on foam each LE  08/10/22 Recumbent stepper, seat 5, level 3 x 5' Gastrocsoleus slant board stretch x 30" x 3 Standing on a counter/table/parallel bars, 2 x  10:  Marches x 2 lbs Mini squats x 6" hold  Heel raises x 2 lbs  Hip vector x 2 lbs  Hamstring curls x 2 lbs Tandem stance, eyes open, foam, 30" x 2 on each Step up, 6" box x 10 x 2 each side Reaching in various direction, foam x 1' Forward stepping over cones x 5 ft x 2 rounds Seated:  Hamstring stretch x 30" x 3 LAQ x 3" x 10 x 2 x 2 lb  08/08/22 Nustep seat 5, level 2 x 5' UE/LE use Standing on a counter/table/parallel bars, 2 x 10 bilaterally:   Marches Mini squats x 6" hold   Heel raises/toeraises   Hip abduction    Hip extensions   Hamstring curls   Step up forward 6" box  Step up lateral 4" box Slant board stretch 3X30" Vectors 10X5" 1 UE assist each LE   08/03/22 Recumbent stepper, seat 5, level 1 x 5' Standing on a counter/table/parallel bars, 2 x 10:  Marches Mini squats x 6" hold  Heel raises  Hip vector  Hamstring curls  Step up, 4" box x 10 x 2 each side  Tandem stance, eyes open, firm surface, x 30" x 2 on each LE  Reaching in various direction, foam x 1' Seated: Gastrocsoleus stretch with a strap x 30" x 3 LAQ x 3" x 10 x 2  08/01/22 Evaluation and patient education Standing on a counter/table, 2 x 10:  Mini squats x 6" hold  Heel raises  Hip abd Seated: Gastrocsoleus stretch with a strap x 30" x 3    PATIENT EDUCATION: Education details: Updated HEP Person educated: Patient Education method: Explanation, Demonstration, and Handouts Education comprehension: verbalized understanding and returned demonstration  HOME EXERCISE PROGRAM: Access Code: [email or web address] URL: [email or web address]  08/29/2022 - Standing Hip Abduction with Counter Support  - 1-2 x daily - 7 x weekly - 2 sets - 10 reps - Seated Long Arc Quad  - 1-2 x daily - 7 x weekly - 2 sets - 10 reps - 3 hold - Standing March with Unilateral Counter Support  - 1-2 x daily - 7 x weekly - 2 sets - 10 reps - Standing Hip Extension with Counter Support  - 1-2 x daily - 7 x weekly  - 2 sets - 10 reps - Standing Hip Flexion with Counter Support  - 1-2 x daily - 7 x weekly - 2 sets - 10 reps - Standing Hamstring Curl with Chair Support  - 1 x daily - 7 x weekly - 2 sets - 10 reps - Seated Hamstring Stretch  - 1-2 x daily - 7 x weekly - 3 reps - 30 hold - Standing Tandem Balance with Counter Support  - 1-2 x daily - 7 x weekly - 2 reps - 30 hold  08/10/2022 - Seated Hamstring Stretch  - 1-2 x daily - 7 x weekly - 3 reps - 30 hold  Date: 08/01/2022 Prepared by:  Rexene Alberts Exercises - Seated Calf Stretch with Strap  - 1-2 x daily - 7 x weekly - 3 reps - 30 hold - Mini Squat with Counter Support  - 1-2 x daily - 7 x weekly - 2 sets - 10 reps - 6 hold - Heel Raises with Counter Support  - 1-2 x daily - 7 x weekly - 2 sets - 10 reps - Standing Hip Abduction with Counter Support  - 1-2 x daily - 7 x weekly - 2 sets - 10 reps  08/08/22 Vectors 10X5" bil LE  GOALS: Goals reviewed with patient? Yes  SHORT TERM GOALS: Target date: 08/15/22  Ptwill demonstrate indep in HEP to facilitate carry-over of skilled services and improve functional outcomes Goal status: MET   LONG TERM GOALS: Target date: 08/29/22  Pt will increase 2MWT by at least 40 ft in order to demonstrate clinically significant improvement in community ambulation Baseline: 327 ft Goal status: MET  2.  Pt will have a decrease in TUG score by at least 3 sec in order to demonstrate clinically significant improvement in community ambulation Baseline: 8.9 sec Goal status: D/C  3.  Pt will demonstrate increase in LE strength to 4+ to facilitate ease and safety in ambulation Baseline: 4- Goal status: MET  ASSESSMENT:  CLINICAL IMPRESSION  Patient demonstrated continued improvements in function as indicated by positive significant changes in 2MWT. Patient also presents some improvements in the TUG and significant improvement in LE strength which signifies that the patient is a low falls risk and has  strong LE. With this, skilled PT is is not required at this time and may be D/C to indep HEP. Interventions today were geared towards reviewing some of her HEP. Demonstrated appropriate levels of fatigue. Rest periods still provided. Little to no unsteadiness seen in tandem stance. Required no cueing to ensure correct execution of activity.   EVAL: Patient is a 77 y.o. female who was seen today for physical therapy evaluation and treatment for difficulty with walking. Patient was diagnosed with intraparenchymal hemorrhage of the brain by referring provider further defined by difficulty with grocery shopping and walking due to LE weakness and impaired proprioception. Skilled PT is required to address the impairments and functional limitations listed below. Patient will benefit from LE strengthening, balance and gait exercises.  OBJECTIVE IMPAIRMENTS: Abnormal gait and impaired flexibility.   ACTIVITY LIMITATIONS:  none  PARTICIPATION LIMITATIONS: community activity  PERSONAL FACTORS: Age are also affecting patient's functional outcome.   REHAB POTENTIAL: Excellent  CLINICAL DECISION MAKING: Stable/uncomplicated  EVALUATION COMPLEXITY: Low  PLAN:  PT FREQUENCY:  0  PT DURATION: other: 0  PLANNED INTERVENTIONS:  Skilled PT not required at this time due to majority of goals being met  PLAN FOR NEXT SESSION: D/C to indep HEP  Ladislao Cohenour L. Katelan Hirt, PT, DPT, OCS Board-Certified Clinical Specialist in [place] # ([place]): [phone] T 08/29/2022, 11:22 AM

## 2022-08-31 ENCOUNTER — Ambulatory Visit (HOSPITAL_COMMUNITY): Payer: Medicare PPO

## 2022-09-13 DIAGNOSIS — R030 Elevated blood-pressure reading, without diagnosis of hypertension: Secondary | ICD-10-CM | POA: Diagnosis not present

## 2022-09-13 DIAGNOSIS — M7551 Bursitis of right shoulder: Secondary | ICD-10-CM | POA: Diagnosis not present

## 2022-09-13 DIAGNOSIS — G629 Polyneuropathy, unspecified: Secondary | ICD-10-CM | POA: Diagnosis not present

## 2022-09-13 DIAGNOSIS — Z6821 Body mass index (BMI) 21.0-21.9, adult: Secondary | ICD-10-CM | POA: Diagnosis not present

## 2022-09-19 DIAGNOSIS — Z85828 Personal history of other malignant neoplasm of skin: Secondary | ICD-10-CM | POA: Diagnosis not present

## 2022-09-19 DIAGNOSIS — Z1283 Encounter for screening for malignant neoplasm of skin: Secondary | ICD-10-CM | POA: Diagnosis not present

## 2022-09-19 DIAGNOSIS — D239 Other benign neoplasm of skin, unspecified: Secondary | ICD-10-CM | POA: Diagnosis not present

## 2022-09-19 DIAGNOSIS — L570 Actinic keratosis: Secondary | ICD-10-CM | POA: Diagnosis not present

## 2022-10-05 DIAGNOSIS — E039 Hypothyroidism, unspecified: Secondary | ICD-10-CM | POA: Diagnosis not present

## 2022-10-16 DIAGNOSIS — Z79899 Other long term (current) drug therapy: Secondary | ICD-10-CM | POA: Diagnosis not present

## 2022-10-16 DIAGNOSIS — Z961 Presence of intraocular lens: Secondary | ICD-10-CM | POA: Diagnosis not present

## 2022-10-16 DIAGNOSIS — H04123 Dry eye syndrome of bilateral lacrimal glands: Secondary | ICD-10-CM | POA: Diagnosis not present

## 2022-10-16 DIAGNOSIS — H53461 Homonymous bilateral field defects, right side: Secondary | ICD-10-CM | POA: Diagnosis not present

## 2022-10-16 DIAGNOSIS — H353131 Nonexudative age-related macular degeneration, bilateral, early dry stage: Secondary | ICD-10-CM | POA: Diagnosis not present

## 2022-10-16 DIAGNOSIS — M069 Rheumatoid arthritis, unspecified: Secondary | ICD-10-CM | POA: Diagnosis not present

## 2022-10-30 DIAGNOSIS — M0589 Other rheumatoid arthritis with rheumatoid factor of multiple sites: Secondary | ICD-10-CM | POA: Diagnosis not present

## 2022-10-30 DIAGNOSIS — Z6821 Body mass index (BMI) 21.0-21.9, adult: Secondary | ICD-10-CM | POA: Diagnosis not present

## 2022-10-30 DIAGNOSIS — Z79899 Other long term (current) drug therapy: Secondary | ICD-10-CM | POA: Diagnosis not present

## 2022-10-30 DIAGNOSIS — R768 Other specified abnormal immunological findings in serum: Secondary | ICD-10-CM | POA: Diagnosis not present

## 2022-10-30 DIAGNOSIS — D702 Other drug-induced agranulocytosis: Secondary | ICD-10-CM | POA: Diagnosis not present

## 2022-10-30 DIAGNOSIS — M5432 Sciatica, left side: Secondary | ICD-10-CM | POA: Diagnosis not present

## 2022-11-01 ENCOUNTER — Ambulatory Visit: Payer: Medicare PPO | Admitting: Adult Health

## 2022-11-01 ENCOUNTER — Encounter: Payer: Self-pay | Admitting: Adult Health

## 2022-11-01 VITALS — BP 163/87 | HR 62 | Ht 62.0 in | Wt 119.6 lb

## 2022-11-01 DIAGNOSIS — R519 Headache, unspecified: Secondary | ICD-10-CM

## 2022-11-01 DIAGNOSIS — I619 Nontraumatic intracerebral hemorrhage, unspecified: Secondary | ICD-10-CM

## 2022-11-01 NOTE — Progress Notes (Signed)
PATIENT: Deborah Kerr DOB: 08/24/1945  REASON FOR VISIT: follow up HISTORY FROM: patient PRIMARY NEUROLOGIST: Dr. Suzie Portela NP  HISTORY OF PRESENT ILLNESS: Today 11/01/22  Deborah Kerr is a 77 y.o. female who has been followed in this office for ICH. Returns today for follow-up.  Overall she feels that she is doing better.  Continues to have issues with word finding.  But is typically able to retrieve the work.  She did complete speech therapy.  Denies any weakness in the extremities.  Headaches have resolved since she started Topamax.  Currently taking 25 mg twice a day.  Saw PCP a few months ago and she thinks he checked her cholesterol levels.   HISTORY ( copied from Ihor Austin NP) Deborah Kerr is a 77 y.o. female with history of HTN, thyroid disease, arthritis, LBBB who presented to ED on 06/20/2022 with sudden onset headache with N/V and hypertensive emergency.  CTH showed acute ICH centered at left posterior temporal/occipital region and small IVH at left lateral ventricle.  CTA head/neck normal vasculature without evidence of aneurysm or AVM.  MRI redemonstrated ICH centered at left posterior temporal and lateral occipital region, likely unchanged, mild surrounding edema with mass effect on the left lateral ventricle atrium, temporal horn and occipital horn with a small amount of left greater than right IVH.  Placed on 3% hypertonic saline.  Required Cleviprex for BP control and eventually transition to home medications of Cozaar and HCTZ, home dose metoprolol dosage increased to 100 mg daily.  Repeat CTH stable appearance without worrisome effect.  EF 60 to 65%.  LDL 114.  A1c 5.6.  Therapies recommended outpatient PT/OT/SLP.     Today, 07/31/2022, patient is being seen for initial hospital follow-up accompanied by her husband.  She reports residual cognitive deficits, occasional word finding difficulty and some right sided peripheral vision impairment. Denies  any residual weakness or gait difficulty.  Scheduled to start PT and SLP this week. She continues to have almost daily left frontal headaches, same as what she was experiencing in hospital, will resolve after use of Tylenol which she has been taking daily.    Blood pressure today 157/89, monitors at home, can fluctuate, does keep log, does not have today nor can remember what her levels typically run  Has seen PCP since d/c, has f/u next month   REVIEW OF SYSTEMS: Out of a complete 14 system review of symptoms, the patient complains only of the following symptoms, and all other reviewed systems are negative.  ALLERGIES: Allergies  Allergen Reactions   Orencia [Abatacept] Itching and Cough    Palms itch    HOME MEDICATIONS: Outpatient Medications Prior to Visit  Medication Sig Dispense Refill   acetaminophen (TYLENOL) 500 MG tablet Take 1,000 mg by mouth every 6 (six) hours as needed for moderate pain or headache.     calcium carbonate (OS-CAL - DOSED IN MG OF ELEMENTAL CALCIUM) 1250 (500 Ca) MG tablet Take 1 tablet (1,250 mg total) by mouth 3 (three) times daily with meals. 30 tablet 0   hydrochlorothiazide (HYDRODIURIL) 12.5 MG tablet Take 12.5 mg by mouth daily.     hydroxychloroquine (PLAQUENIL) 200 MG tablet Take 200 mg by mouth daily with lunch.     leflunomide (ARAVA) 20 MG tablet Take 20 mg by mouth daily.     levothyroxine (SYNTHROID) 125 MCG tablet Take 125 mcg by mouth daily before breakfast.     losartan (COZAAR) 100 MG tablet  Take 100 mg by mouth daily.     metoprolol succinate (TOPROL-XL) 100 MG 24 hr tablet Take 1 tablet (100 mg total) by mouth daily. Take with or immediately following a meal. 30 tablet 0   polyethylene glycol (MIRALAX / GLYCOLAX) 17 g packet Take 17 g by mouth daily. 14 each 0   potassium chloride SA (KLOR-CON) 20 MEQ tablet Take 20 mEq by mouth daily.     rosuvastatin (CRESTOR) 10 MG tablet Take 1 tablet (10 mg total) by mouth daily. 30 tablet 5    topiramate (TOPAMAX) 25 MG tablet Take 1 tablet (25 mg total) by mouth 2 (two) times daily. 120 tablet 3   No facility-administered medications prior to visit.    PAST MEDICAL HISTORY: Past Medical History:  Diagnosis Date   Arthritis    rheumatoid arthritis   Hypertension    Hypothyroidism 2009   Left bundle branch block    Thyroid disease     PAST SURGICAL HISTORY: Past Surgical History:  Procedure Laterality Date   APPENDECTOMY     per patient 1970s   COLONOSCOPY     COLONOSCOPY N/A 05/18/2015   Procedure: COLONOSCOPY;  Surgeon: Corbin Ade, MD;  [facility];  Service: Endoscopy;  Laterality: N/A;  11:30   EYE SURGERY Bilateral 2021   cataract surgery   HARDWARE REMOVAL Left 12/22/2020   Procedure: HARDWARE REMOVAL LEFT ANKLE;  Surgeon: Roby Lofts, MD;  Location: [facility];  Service: Orthopedics;  Laterality: Left;   LUMBAR LAMINECTOMY/DECOMPRESSION MICRODISCECTOMY N/A 07/21/2021   Procedure: Microlumbar decompression Lumbar Four-Five and Lumbar Five - Sacral One, excision of synovial cyst;  Surgeon: Jene Every, MD;  Location: [facility];  Service: Orthopedics;  Laterality: N/A;   OOPHORECTOMY     ORIF ANKLE FRACTURE Left 06/24/2020   Procedure: OPEN REDUCTION INTERNAL FIXATION (ORIF) ANKLE FRACTURE;  Surgeon: Roby Lofts, MD;  Location: [facility];  Service: Orthopedics;  Laterality: Left;   Thyroidectomy  2006   TUBAL LIGATION      FAMILY HISTORY: No family history on file.  SOCIAL HISTORY: Social History   Socioeconomic History   Marital status: Married    Spouse name: Not on file   Number of children: Not on file   Years of education: Not on file   Highest education level: Not on file  Occupational History   Not on file  Tobacco Use   Smoking status: Never   Smokeless tobacco: Never  Vaping Use   Vaping Use: Never used  Substance and Sexual Activity   Alcohol use: No   Drug use: No   Sexual activity: Not Currently  Other Topics Concern    Not on file  Social History Narrative   Not on file   Social Determinants of Health   Financial Resource Strain: Not on file  Food Insecurity: Not on file  Transportation Needs: Not on file  Physical Activity: Not on file  Stress: Not on file  Social Connections: Not on file  Intimate Partner Violence: Not At Risk (06/21/2022)   Humiliation, Afraid, Rape, and Kick questionnaire    Fear of Current or Ex-Partner: No    Emotionally Abused: No    Physically Abused: No    Sexually Abused: No      PHYSICAL EXAM  Vitals:   11/01/22 1138 11/01/22 1214  BP: (!) 154/77 (!) 163/87  Pulse: 65 62  Weight: 119 lb 9.6 oz (54.3 kg)   Height: 5\' 2"  (1.575 m)  Body mass index is 21.88 kg/m.  Generalized: Well developed, in no acute distress   Neurological examination  Mentation: Alert oriented to time, place, history taking. Follows all commands speech and language fluent Cranial nerve II-XII: Pupils were equal round reactive to light. Extraocular movements were full, visual field were full on confrontational test. Facial sensation and strength were normal. Uvula tongue midline. Head turning and shoulder shrug  were normal and symmetric. Motor: The motor testing reveals 5 over 5 strength of all 4 extremities. Good symmetric motor tone is noted throughout.  Sensory: Sensory testing is intact to soft touch on all 4 extremities. No evidence of extinction is noted.  Coordination: Cerebellar testing reveals good finger-nose-finger and heel-to-shin bilaterally.  Gait and station: Gait is normal.  Reflexes: Deep tendon reflexes are symmetric and normal bilaterally.   DIAGNOSTIC DATA (LABS, IMAGING, TESTING) - I reviewed patient records, labs, notes, testing and imaging myself where available.  Lab Results  Component Value Date   WBC 7.1 06/26/2022   HGB 10.7 (L) 06/26/2022   HCT 33.0 (L) 06/26/2022   MCV 93.5 06/26/2022   PLT 166 06/26/2022      Component Value Date/Time   NA  140 06/26/2022 0420   K 3.1 (L) 06/26/2022 0420   CL 111 06/26/2022 0420   CO2 18 (L) 06/26/2022 0420   GLUCOSE 91 06/26/2022 0420   BUN 6 (L) 06/26/2022 0420   CREATININE 0.68 06/26/2022 0420   CALCIUM 6.7 (L) 06/26/2022 0420   PROT 7.5 06/20/2022 0736   ALBUMIN 4.3 06/20/2022 0736   AST 24 06/20/2022 0736   ALT 14 06/20/2022 0736   ALKPHOS 60 06/20/2022 0736   BILITOT 0.8 06/20/2022 0736   GFRNONAA >60 06/26/2022 0420   Lab Results  Component Value Date   CHOL 198 06/22/2022   HDL 59 06/22/2022   LDLCALC 114 (H) 06/22/2022   TRIG 126 06/22/2022   CHOLHDL 3.4 06/22/2022   Lab Results  Component Value Date   HGBA1C 5.6 06/22/2022   No results found for: "VITAMINB12" Lab Results  Component Value Date   TSH 0.178 (L) 06/20/2022      ASSESSMENT AND PLAN 77 y.o. year old female  has a past medical history of Arthritis, Hypertension, Hypothyroidism (2009), Left bundle branch block, and Thyroid disease. here with:  1.  History of ICH 2.  Headaches   Okay to decrease Topamax to 25 mg daily for 1 week then discontinue the medication.  If headaches return-okay to resume Topamax HTN: BP goal <130/90.  Slightly elevated today.  Advised to keep a check on it at home if it remains elevated discussed with PCP HLD: LDL goal <70.  Reports that she had blood work through her PCP DMII: A1c goal<7.0.  Encouraged patient to monitor diet and encouraged exercise FU with our office 6 months with Tamsen Snider, MSN, NP-C 11/01/2022, 11:21 AM [facility] [address] [place], [place] [address] [phone]

## 2022-11-01 NOTE — Patient Instructions (Addendum)
Your Plan:  Reduce topamax to 25 mg daily for 1 week than can try stopping medication. If headaches return you can restart.    Blood pressure goal <130/90 Cholesterol LDL goal <70 Diabetes goal A1c <7 Monitor diet and try to exercise    Thank you for coming to see [place] at  [facility]. I hope we have been able to provide you high quality care today.  You may receive a patient satisfaction survey over the next few weeks. We would appreciate your feedback and comments so that we may continue to improve ourselves and the health of our patients.

## 2022-11-09 DIAGNOSIS — M5451 Vertebrogenic low back pain: Secondary | ICD-10-CM | POA: Diagnosis not present

## 2022-11-23 DIAGNOSIS — M5451 Vertebrogenic low back pain: Secondary | ICD-10-CM | POA: Diagnosis not present

## 2022-11-28 DIAGNOSIS — M5451 Vertebrogenic low back pain: Secondary | ICD-10-CM | POA: Diagnosis not present

## 2022-11-30 DIAGNOSIS — M5416 Radiculopathy, lumbar region: Secondary | ICD-10-CM | POA: Diagnosis not present

## 2022-12-20 DIAGNOSIS — I619 Nontraumatic intracerebral hemorrhage, unspecified: Secondary | ICD-10-CM | POA: Diagnosis not present

## 2022-12-20 DIAGNOSIS — E039 Hypothyroidism, unspecified: Secondary | ICD-10-CM | POA: Diagnosis not present

## 2022-12-20 DIAGNOSIS — E7801 Familial hypercholesterolemia: Secondary | ICD-10-CM | POA: Diagnosis not present

## 2022-12-20 DIAGNOSIS — E7849 Other hyperlipidemia: Secondary | ICD-10-CM | POA: Diagnosis not present

## 2022-12-22 DIAGNOSIS — M5416 Radiculopathy, lumbar region: Secondary | ICD-10-CM | POA: Diagnosis not present

## 2022-12-22 DIAGNOSIS — M47816 Spondylosis without myelopathy or radiculopathy, lumbar region: Secondary | ICD-10-CM | POA: Diagnosis not present

## 2022-12-25 DIAGNOSIS — G629 Polyneuropathy, unspecified: Secondary | ICD-10-CM | POA: Diagnosis not present

## 2022-12-25 DIAGNOSIS — M05719 Rheumatoid arthritis with rheumatoid factor of unspecified shoulder without organ or systems involvement: Secondary | ICD-10-CM | POA: Diagnosis not present

## 2022-12-25 DIAGNOSIS — D709 Neutropenia, unspecified: Secondary | ICD-10-CM | POA: Diagnosis not present

## 2022-12-25 DIAGNOSIS — E039 Hypothyroidism, unspecified: Secondary | ICD-10-CM | POA: Diagnosis not present

## 2022-12-25 DIAGNOSIS — I10 Essential (primary) hypertension: Secondary | ICD-10-CM | POA: Diagnosis not present

## 2022-12-25 DIAGNOSIS — E7849 Other hyperlipidemia: Secondary | ICD-10-CM | POA: Diagnosis not present

## 2022-12-25 DIAGNOSIS — M549 Dorsalgia, unspecified: Secondary | ICD-10-CM | POA: Diagnosis not present

## 2022-12-25 DIAGNOSIS — Z8679 Personal history of other diseases of the circulatory system: Secondary | ICD-10-CM | POA: Diagnosis not present

## 2023-01-01 DIAGNOSIS — Z1231 Encounter for screening mammogram for malignant neoplasm of breast: Secondary | ICD-10-CM | POA: Diagnosis not present

## 2023-01-25 ENCOUNTER — Other Ambulatory Visit: Payer: Self-pay | Admitting: Adult Health

## 2023-03-19 DIAGNOSIS — Z85828 Personal history of other malignant neoplasm of skin: Secondary | ICD-10-CM | POA: Diagnosis not present

## 2023-03-19 DIAGNOSIS — Z1283 Encounter for screening for malignant neoplasm of skin: Secondary | ICD-10-CM | POA: Diagnosis not present

## 2023-03-19 DIAGNOSIS — D485 Neoplasm of uncertain behavior of skin: Secondary | ICD-10-CM | POA: Diagnosis not present

## 2023-04-12 DIAGNOSIS — Z2911 Encounter for prophylactic immunotherapy for respiratory syncytial virus (RSV): Secondary | ICD-10-CM | POA: Diagnosis not present

## 2023-04-12 DIAGNOSIS — Z23 Encounter for immunization: Secondary | ICD-10-CM | POA: Diagnosis not present

## 2023-04-18 DIAGNOSIS — H02834 Dermatochalasis of left upper eyelid: Secondary | ICD-10-CM | POA: Diagnosis not present

## 2023-04-18 DIAGNOSIS — M069 Rheumatoid arthritis, unspecified: Secondary | ICD-10-CM | POA: Diagnosis not present

## 2023-04-18 DIAGNOSIS — H353131 Nonexudative age-related macular degeneration, bilateral, early dry stage: Secondary | ICD-10-CM | POA: Diagnosis not present

## 2023-04-18 DIAGNOSIS — Z961 Presence of intraocular lens: Secondary | ICD-10-CM | POA: Diagnosis not present

## 2023-04-18 DIAGNOSIS — H04123 Dry eye syndrome of bilateral lacrimal glands: Secondary | ICD-10-CM | POA: Diagnosis not present

## 2023-04-18 DIAGNOSIS — H53461 Homonymous bilateral field defects, right side: Secondary | ICD-10-CM | POA: Diagnosis not present

## 2023-04-18 DIAGNOSIS — Z79899 Other long term (current) drug therapy: Secondary | ICD-10-CM | POA: Diagnosis not present

## 2023-04-18 DIAGNOSIS — H02831 Dermatochalasis of right upper eyelid: Secondary | ICD-10-CM | POA: Diagnosis not present

## 2023-04-26 DIAGNOSIS — Z23 Encounter for immunization: Secondary | ICD-10-CM | POA: Diagnosis not present

## 2023-04-27 IMAGING — DX DG LUMBAR SPINE 2-3V
2 series · 2 of 2 positions shown · non-contrast
Comparison: None.

CLINICAL DATA: Pre-admission for lumbar fusion.

EXAM:
LUMBAR SPINE - 2-3 VIEW

[w lumbar spine ap]
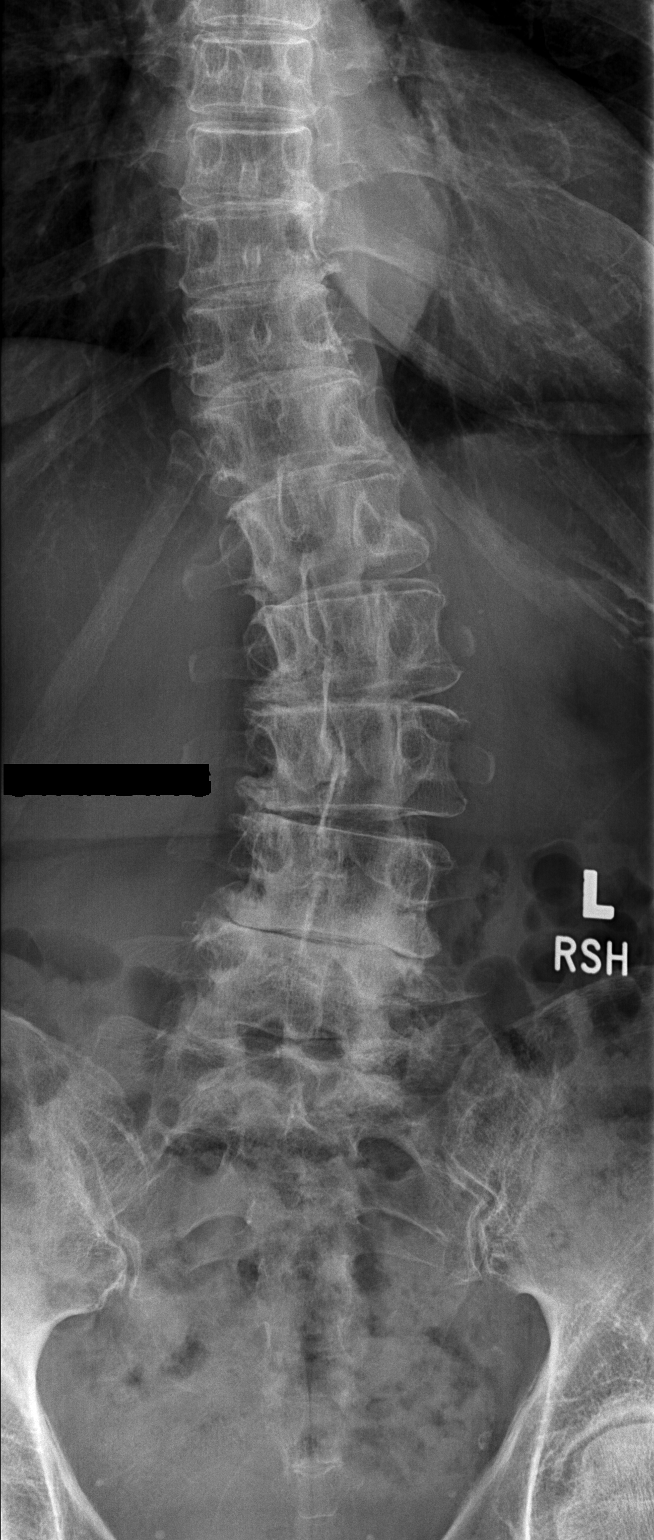

[w lumbar spine lat]
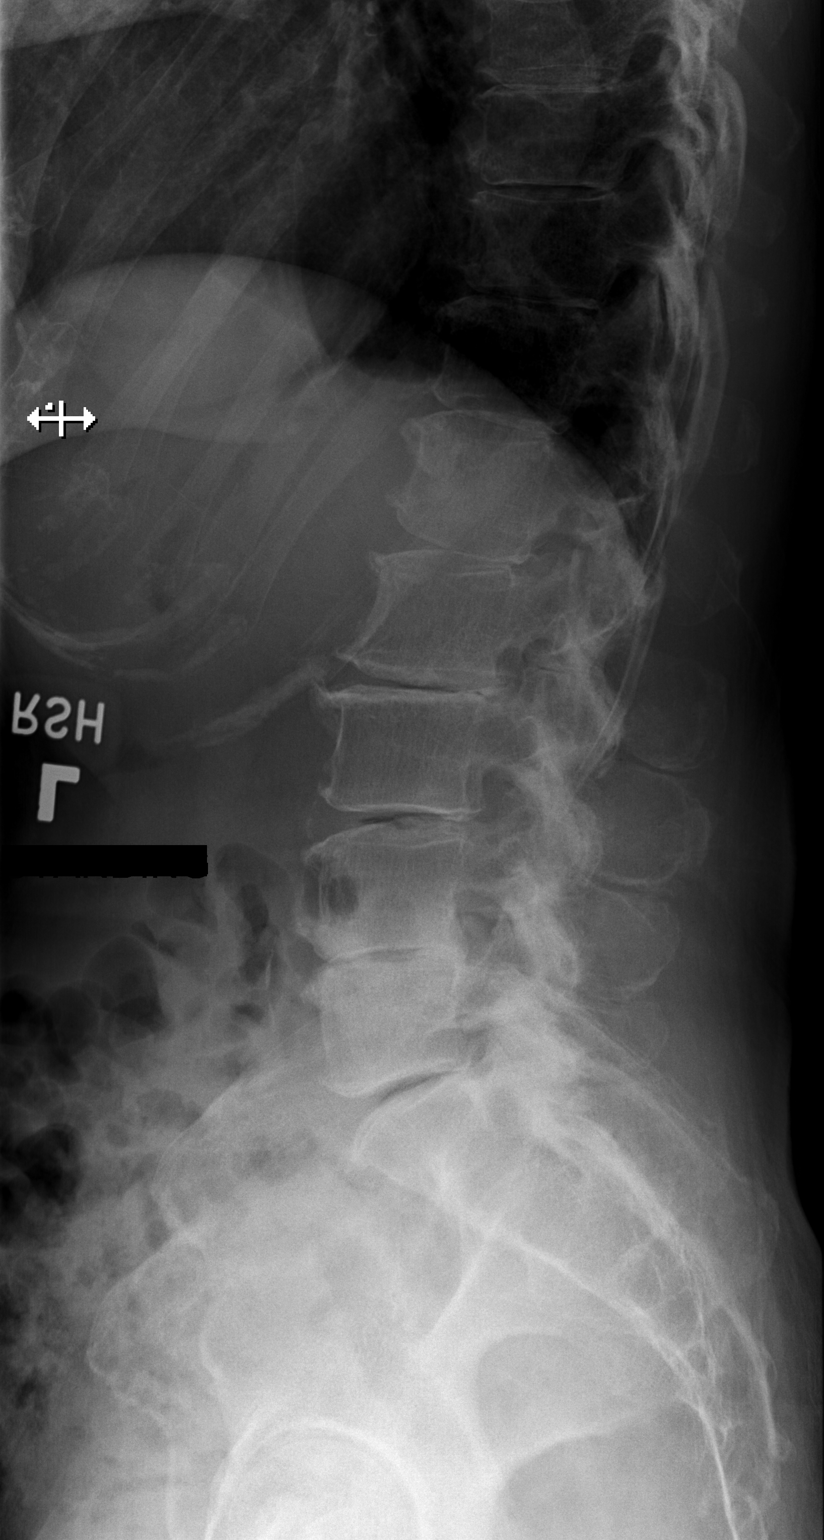

[2 of 2 positions shown; findings below may reference images not displayed]

FINDINGS: Levoconvex thoracolumbar scoliosis. Alignment is otherwise anatomic.
Vertebral body height is maintained. Multilevel endplate
degenerative changes and loss of disc space height. Findings are
worst at L2-3 and L4-5. Facet hypertrophy in the lumbar spine.
IMPRESSION: 1. Multilevel degenerative disc disease, worst at L2-3 and L4-5.
2. Multilevel facet hypertrophy.
3. Levoconvex scoliosis.

## 2023-04-30 IMAGING — CR DG LUMBAR SPINE 2-3V
3 series · 3 of 3 positions shown · non-contrast
Comparison: Lumbar spine x-ray 07/18/2021.

CLINICAL DATA: Localization intraoperative image.

EXAM:
LUMBAR SPINE - 2-3 VIEW

[lateral (1 of 3)]
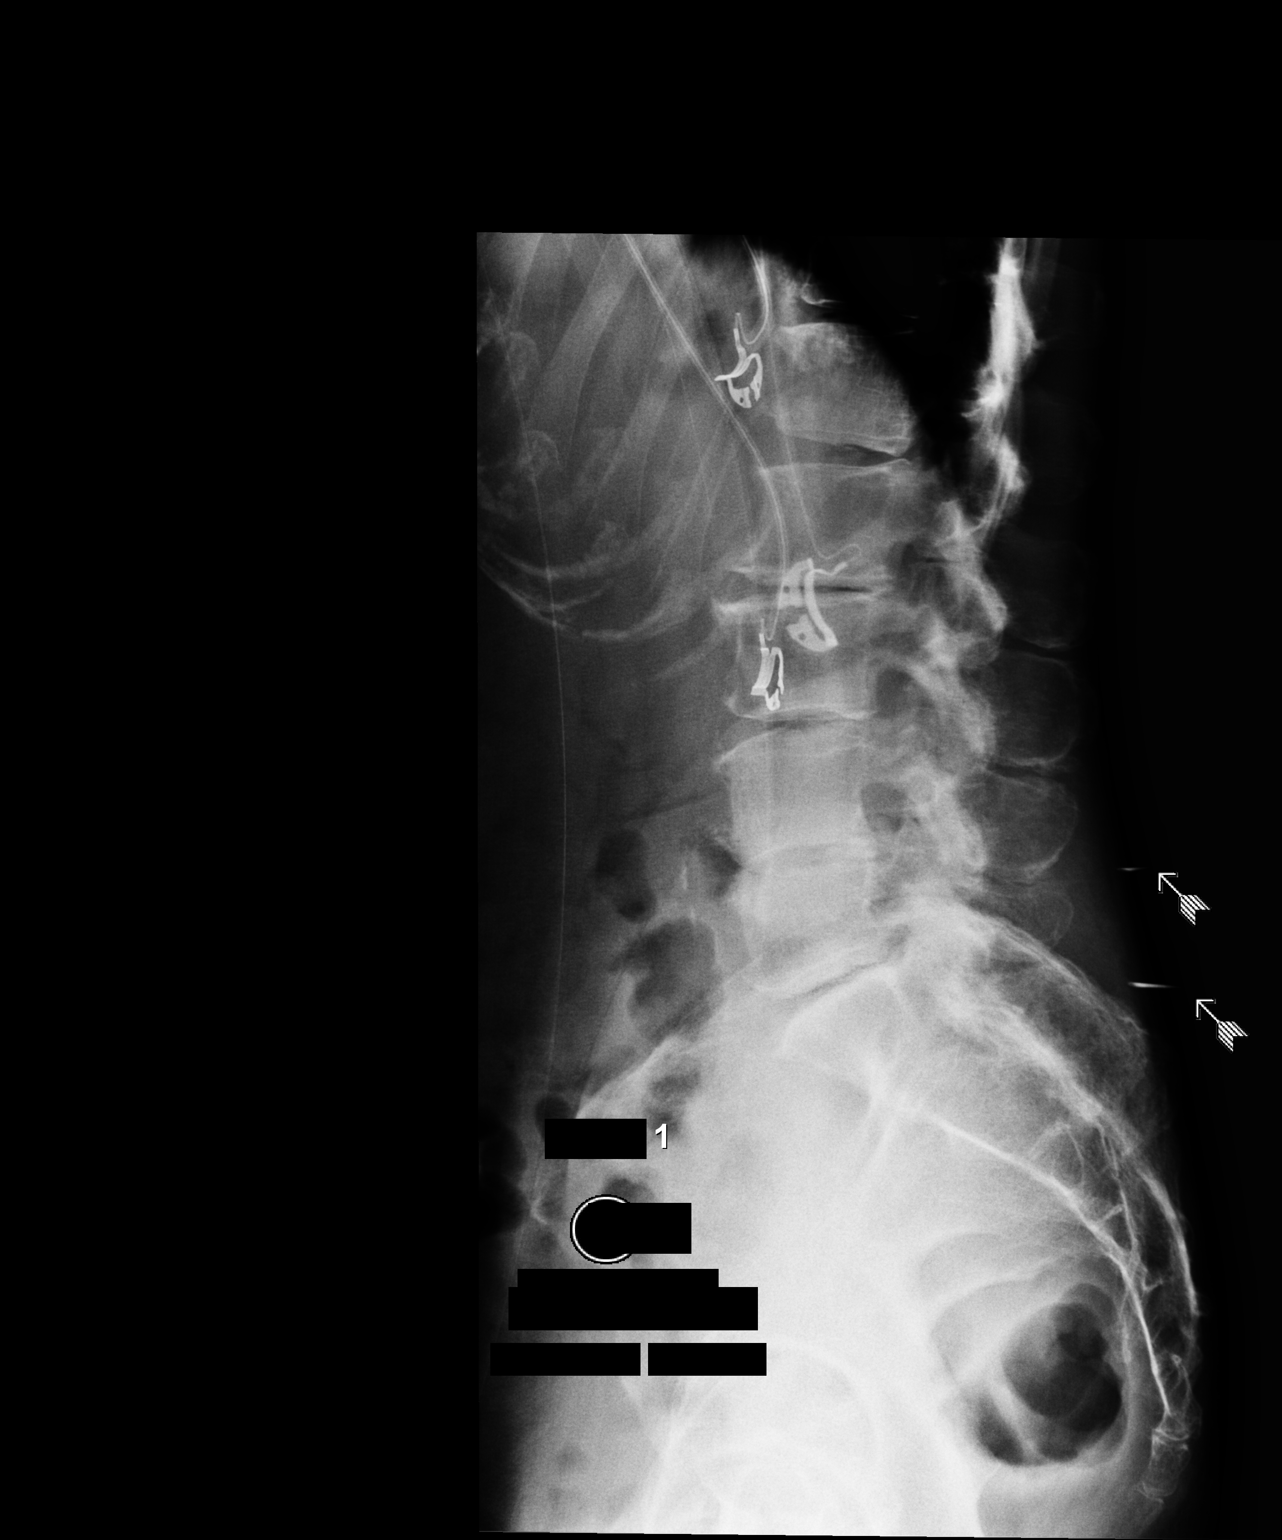

[lateral (2 of 3)]
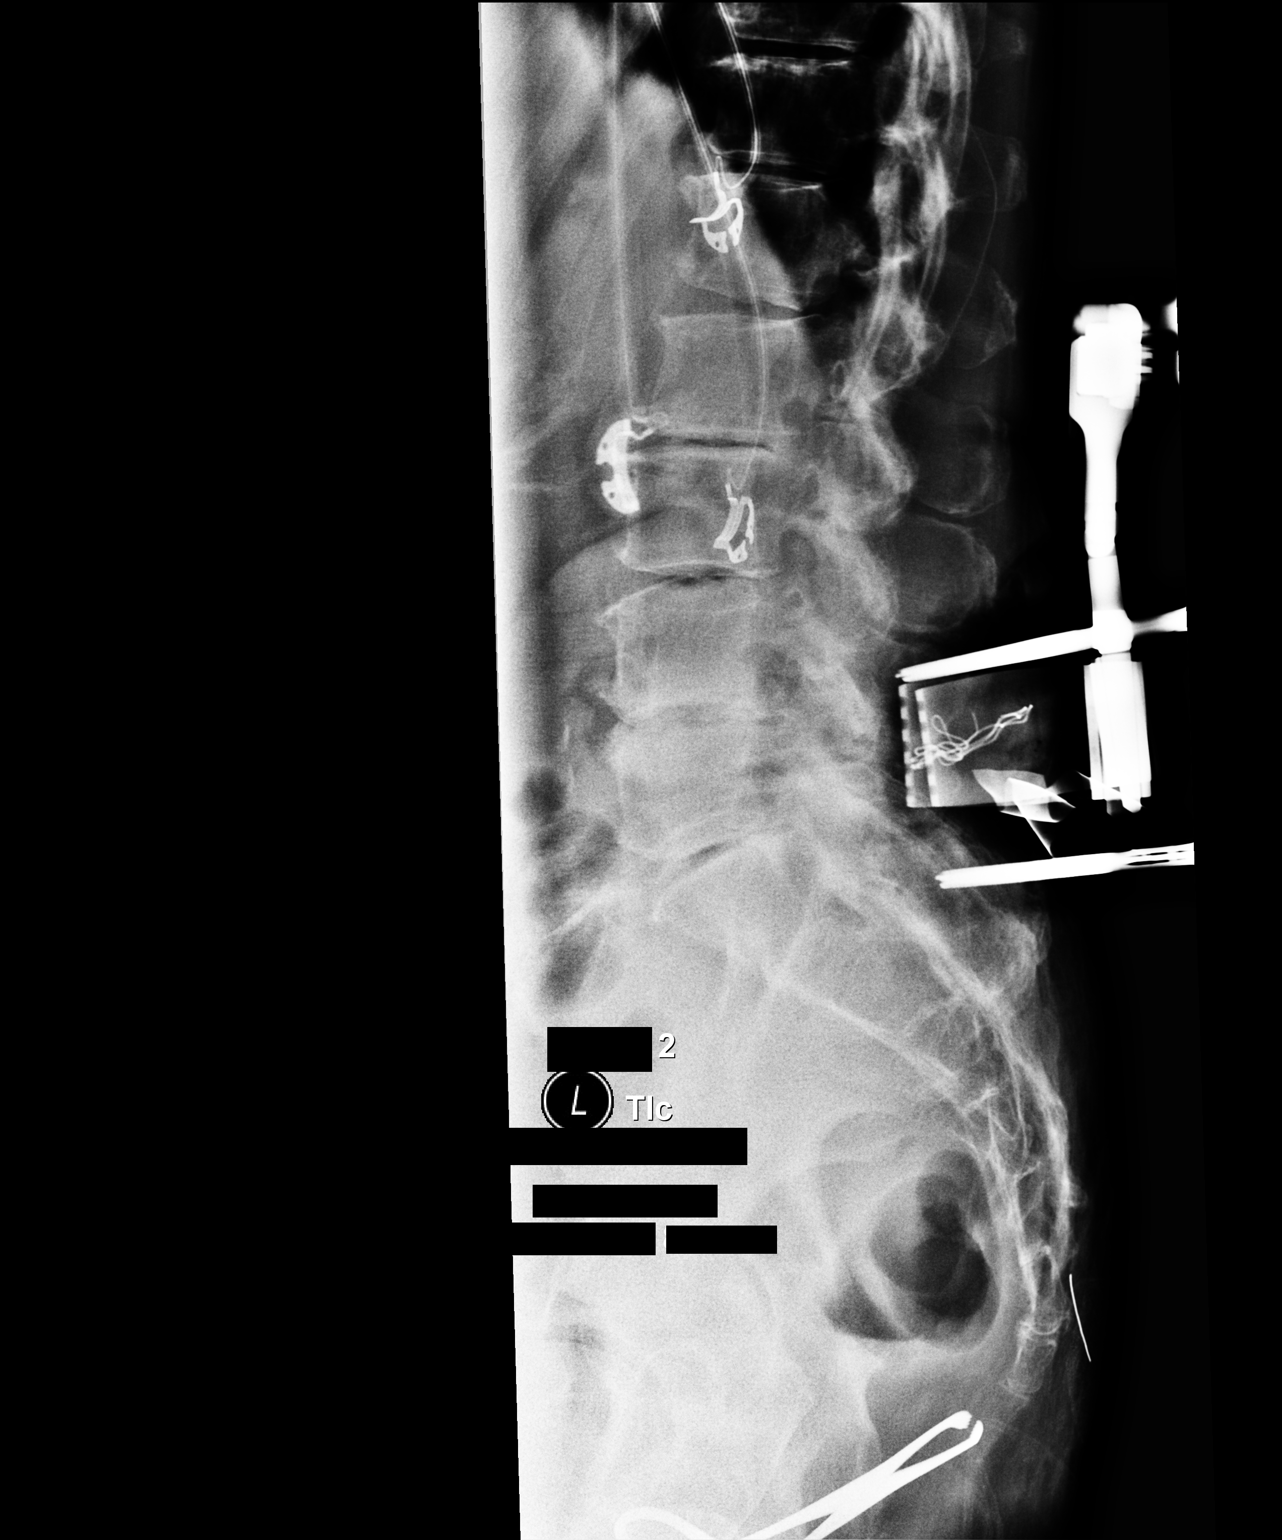

[lateral (3 of 3)]
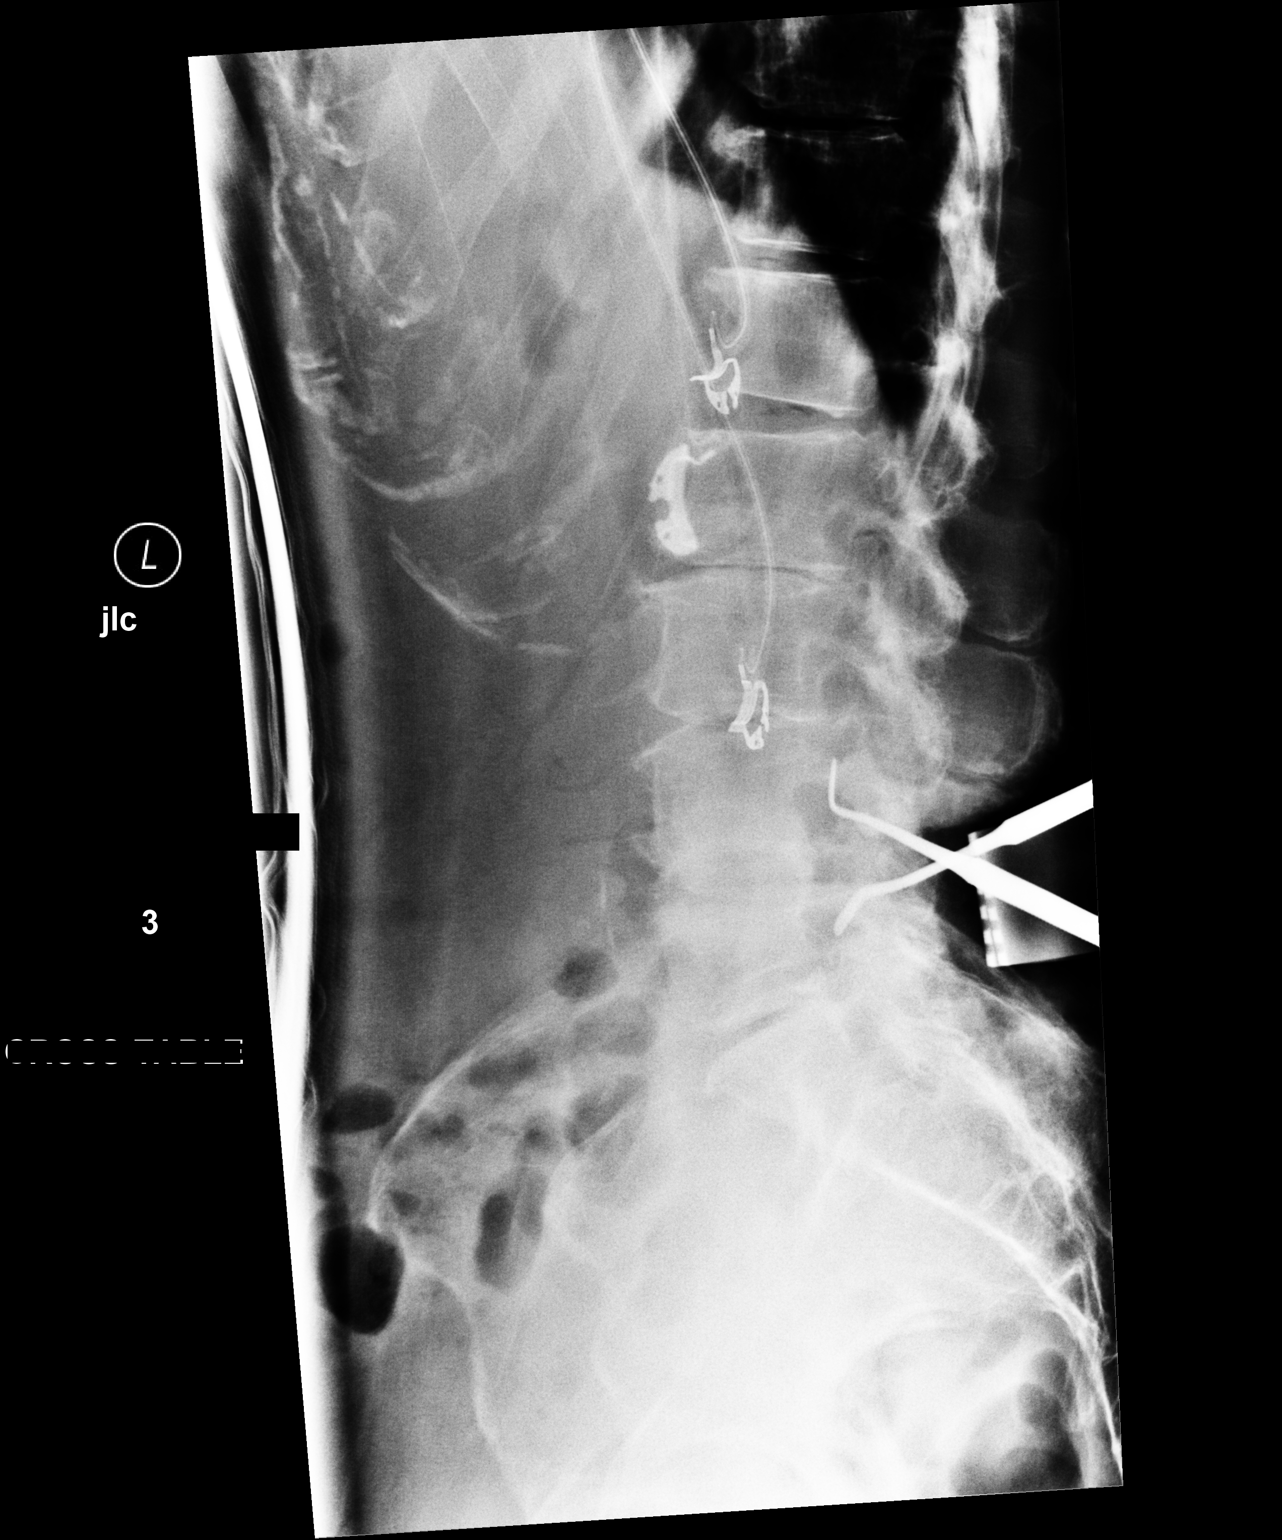

[3 of 3 positions shown; findings below may reference images not displayed]

FINDINGS: Intraoperative images of the lumbar spine were performed. Hardware
overlies the posterior elements at L4-L5 on the third image. There
is no fracture. Alignment is anatomic. There is stable moderate
severe disc space narrowing throughout the lumbar spine compatible
with degenerative change.
IMPRESSION: Intraoperative lumbar spine.

## 2023-05-01 DIAGNOSIS — M5432 Sciatica, left side: Secondary | ICD-10-CM | POA: Diagnosis not present

## 2023-05-01 DIAGNOSIS — Z79899 Other long term (current) drug therapy: Secondary | ICD-10-CM | POA: Diagnosis not present

## 2023-05-01 DIAGNOSIS — H26491 Other secondary cataract, right eye: Secondary | ICD-10-CM | POA: Diagnosis not present

## 2023-05-01 DIAGNOSIS — M0589 Other rheumatoid arthritis with rheumatoid factor of multiple sites: Secondary | ICD-10-CM | POA: Diagnosis not present

## 2023-05-01 DIAGNOSIS — Z6821 Body mass index (BMI) 21.0-21.9, adult: Secondary | ICD-10-CM | POA: Diagnosis not present

## 2023-05-01 DIAGNOSIS — R768 Other specified abnormal immunological findings in serum: Secondary | ICD-10-CM | POA: Diagnosis not present

## 2023-05-01 DIAGNOSIS — D702 Other drug-induced agranulocytosis: Secondary | ICD-10-CM | POA: Diagnosis not present

## 2023-05-14 ENCOUNTER — Encounter: Payer: Self-pay | Admitting: Adult Health

## 2023-05-14 ENCOUNTER — Ambulatory Visit: Payer: Medicare PPO | Admitting: Adult Health

## 2023-05-14 VITALS — BP 158/88 | HR 62 | Ht 63.0 in | Wt 120.0 lb

## 2023-05-14 DIAGNOSIS — I619 Nontraumatic intracerebral hemorrhage, unspecified: Secondary | ICD-10-CM

## 2023-05-14 NOTE — Progress Notes (Signed)
[facility] [address] [place]. [place] [address] [phone]       OFFICE FOLLOW UP NOTE  Ms. Deborah Kerr Date of Birth:  01/28/1946 Medical Record Number:  [id number]    Primary neurologist: Dr. Pearlean Brownie Reason for visit: Stroke follow up    SUBJECTIVE:   CHIEF COMPLAINT:  Chief Complaint  Patient presents with   Follow-up    Patient in room #8 and alone. Patient states she doing well when its comes to her Hemorrhage of the brain but she having back pain.    HPI:   Update 05/14/2023 JM: Patient returns for follow-up visit.  Previously seen by Aundra Millet, NP noting resolution of headaches on topiramate therefore recommended gradually tapering off.  Reports doing well since prior visit as new stroke/TIA symptoms. Remains off topiramate, will have occasional mild headaches but not overly bothersome.  Occasional mild short-term memory difficulties or delayed recall and occasional word finding difficulty but denies any worsening.  Denies any residual visual impairment, routinely followed by ophthalmology. Maintains ADLs and IADLs independently as well as driving without difficulty.  Remains on Crestor without side effects. BP elevated today, monitors occasionally at home and can fluctuate, has f/u with PCP in December to discuss further.   Mentions low back pain, previously followed by [facility] receiving epidural injection with great response, still okay since receiving injection, can feel more of a stiffness upon standing up, has been trying to walk more.  Denies any significant pain or radiculopathy symptoms.     History provided for reference purposes only Update 11/01/2022 MM: Deborah Kerr is a 77 y.o. female who has been followed in this office for ICH. Returns today for follow-up.  Overall she feels that she is doing better.  Continues to have issues with word finding.  But is typically able to retrieve the work.  She did complete speech therapy.  Denies  any weakness in the extremities.  Headaches have resolved since she started Topamax.  Currently taking 25 mg twice a day. Saw PCP a few months ago and she thinks he checked her cholesterol levels.   Initial visit 07/31/2022 JM: Patient is being seen for initial hospital follow-up accompanied by her husband.  She reports residual cognitive deficits, occasional word finding difficulty and some right sided peripheral vision impairment. Denies any residual weakness or gait difficulty.  Scheduled to start PT and SLP this week. She continues to have almost daily left frontal headaches, same as what she was experiencing in hospital, will resolve after use of Tylenol which she has been taking daily.   Blood pressure today 157/89, monitors at home, can fluctuate, does keep log, does not have today nor can remember what her levels typically run  Has seen PCP since d/c, has f/u next month   Stroke admission 06/20/2022 Deborah Kerr is a 77 y.o. female with history of HTN, thyroid disease, arthritis, LBBB who presented to ED on 06/20/2022 with sudden onset headache with N/V and hypertensive emergency.  CTH showed acute ICH centered at left posterior temporal/occipital region and small IVH at left lateral ventricle.  CTA head/neck normal vasculature without evidence of aneurysm or AVM.  MRI redemonstrated ICH centered at left posterior temporal and lateral occipital region, likely unchanged, mild surrounding edema with mass effect on the left lateral ventricle atrium, temporal horn and occipital horn with a small amount of left greater than right IVH.  Placed on 3% hypertonic saline.  Required Cleviprex for BP control and eventually  transition to home medications of Cozaar and HCTZ, home dose metoprolol dosage increased to 100 mg daily.  Repeat CTH stable appearance without worrisome effect.  EF 60 to 65%.  LDL 114.  A1c 5.6.  Therapies recommended outpatient PT/OT/SLP.      PERTINENT IMAGING  Per  hospitalization 06/20/2022 -06/26/2022 Code Stroke CT head - Acute ICH centered in left posterior temporal/occipital region.  Small IVH at left lateral ventricle. CTA head & neck- Normal vasculature of the head and neck with no aneurysm or AVM.  MRI  Redemonstrated ICH centered in the left posterior temporal and lateral occipital region, likely unchanged.  Mild surrounding edema with mass effect on the left lateral ventricle atrium, temporal horn, and occipital horn, with a small amount of left greater than right IVH. 12/21 and 12/22- CT Head x 2 - Unchanged left temporal lobe hematoma and rim of edema. No worrisome mass effect. 2D Echo EF 65-70% LDL 114 HgbA1c 5.6    ROS:   14 system review of systems performed and negative with exception of those listed in HPI  PMH:  Past Medical History:  Diagnosis Date   Arthritis    rheumatoid arthritis   Hypertension    Hypothyroidism 2009   Left bundle branch block    Thyroid disease     PSH:  Past Surgical History:  Procedure Laterality Date   APPENDECTOMY     per patient 1970s   COLONOSCOPY     COLONOSCOPY N/A 05/18/2015   Procedure: COLONOSCOPY;  Surgeon: Corbin Ade, MD;  [facility]: [facility];  Service: Endoscopy;  Laterality: N/A;  11:30   EYE SURGERY Bilateral 2021   cataract surgery   HARDWARE REMOVAL Left 12/22/2020   Procedure: HARDWARE REMOVAL LEFT ANKLE;  Surgeon: Roby Lofts, MD;  Location: [facility];  Service: Orthopedics;  Laterality: Left;   LUMBAR LAMINECTOMY/DECOMPRESSION MICRODISCECTOMY N/A 07/21/2021   Procedure: Microlumbar decompression Lumbar Four-Five and Lumbar Five - Sacral One, excision of synovial cyst;  Surgeon: Jene Every, MD;  Location: [facility];  Service: Orthopedics;  Laterality: N/A;   OOPHORECTOMY     ORIF ANKLE FRACTURE Left 06/24/2020   Procedure: OPEN REDUCTION INTERNAL FIXATION (ORIF) ANKLE FRACTURE;  Surgeon: Roby Lofts, MD;  Location: [facility];  Service: Orthopedics;  Laterality: Left;    Thyroidectomy  2006   TUBAL LIGATION      Social History:  Social History   Socioeconomic History   Marital status: Married    Spouse name: Not on file   Number of children: Not on file   Years of education: Not on file   Highest education level: Not on file  Occupational History   Not on file  Tobacco Use   Smoking status: Never   Smokeless tobacco: Never  Vaping Use   Vaping status: Never Used  Substance and Sexual Activity   Alcohol use: No   Drug use: No   Sexual activity: Not Currently  Other Topics Concern   Not on file  Social History Narrative   Not on file   Social Determinants of Health   Financial Resource Strain: Not on file  Food Insecurity: Not on file  Transportation Needs: Not on file  Physical Activity: Not on file  Stress: Not on file  Social Connections: Not on file  Intimate Partner Violence: Not At Risk (06/21/2022)   Humiliation, Afraid, Rape, and Kick questionnaire    Fear of Current or Ex-Partner: No    Emotionally Abused: No  Physically Abused: No    Sexually Abused: No    Family History: History reviewed. No pertinent family history.  Medications:   Current Outpatient Medications on File Prior to Visit  Medication Sig Dispense Refill   acetaminophen (TYLENOL) 500 MG tablet Take 1,000 mg by mouth every 6 (six) hours as needed for moderate pain or headache.     calcium carbonate (OS-CAL - DOSED IN MG OF ELEMENTAL CALCIUM) 1250 (500 Ca) MG tablet Take 1 tablet (1,250 mg total) by mouth 3 (three) times daily with meals. 30 tablet 0   hydrochlorothiazide (HYDRODIURIL) 12.5 MG tablet Take 12.5 mg by mouth daily.     hydroxychloroquine (PLAQUENIL) 200 MG tablet Take 200 mg by mouth daily with lunch.     leflunomide (ARAVA) 20 MG tablet Take 20 mg by mouth daily.     levothyroxine (SYNTHROID) 112 MCG tablet Take 112 mcg by mouth daily before breakfast.     losartan (COZAAR) 100 MG tablet Take 100 mg by mouth daily.     metoprolol succinate  (TOPROL-XL) 100 MG 24 hr tablet Take 1 tablet (100 mg total) by mouth daily. Take with or immediately following a meal. 30 tablet 0   polyethylene glycol (MIRALAX / GLYCOLAX) 17 g packet Take 17 g by mouth daily. 14 each 0   Potassium Chloride ER 20 MEQ TBCR Take 1 tablet by mouth daily.     rosuvastatin (CRESTOR) 10 MG tablet Take 1 tablet by mouth once daily 30 tablet 3   topiramate (TOPAMAX) 25 MG tablet Take 1 tablet (25 mg total) by mouth 2 (two) times daily. 120 tablet 3   No current facility-administered medications on file prior to visit.    Allergies:   Allergies  Allergen Reactions   Orencia [Abatacept] Itching and Cough    Palms itch      OBJECTIVE:  Physical Exam  Vitals:   05/14/23 1102  BP: (!) 162/82  Pulse: 62  Weight: 120 lb 0.6 oz (54.4 kg)  Height: 5\' 3"  (1.6 m)   Body mass index is 21.26 kg/m. No results found.  General: well developed, well nourished, very pleasant elderly Caucasian female, seated, in no evident distress  Neurologic Exam Mental Status: Awake and fully alert. Some difficulty naming objects, fluent with conversation, some difficulty with reading.  Follows commands without difficulty.  Oriented to place and time. Recent memory mildly impaired and remote memory intact. Attention span, concentration and fund of knowledge appropriate during visit. Mood and affect appropriate.  Cranial Nerves: Pupils equal, briskly reactive to light. Extraocular movements full without nystagmus. Visual fields full to confrontation. Hearing intact. Facial sensation intact. Face, tongue, palate moves normally and symmetrically.  Motor: Normal bulk and tone. Normal strength in all tested extremity muscles Sensory.: intact to touch , pinprick , position and vibratory sensation.  Coordination: Rapid alternating movements normal in all extremities. Finger-to-nose and heel-to-shin performed accurately bilaterally. Gait and Station: Arises from chair without difficulty.  Stance is normal. Gait demonstrates normal stride length and balance without use of AD.  Reflexes: 1+ and symmetric. Toes downgoing.       ASSESSMENT: Deborah Kerr is a 77 y.o. 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 Vascular risk factors include HTN and advanced age.      PLAN:  L temporal/occipital ICH with IVH:  Residual deficit: Mild cognitive impairment and mild aphasia, stable. Discussed routine cognitive exercises No indication for antithrombotic in setting of recent  ICH and no prior stroke history.   Continue Crestor 10 mg daily for secondary stroke prevention measures managed/prescribed by PCP Blood pressure today elevated, asymptomatic- advised routine monitoring at home with log and f/u with PCP for ongoing monitoring/management Discussed secondary stroke prevention measures and importance of close PCP follow up for aggressive stroke risk factor management including BP goal<130/90, and HLD with LDL goal<70  I have gone over the pathophysiology of stroke, warning signs and symptoms, risk factors and their management in some detail with instructions to go to the closest emergency room for symptoms of concern.  Vascular headache: Post ICH, resolved      Doing well from stroke standpoint without further recommendations and risk factors are managed by PCP. She may follow up PRN, as usual for our patients who are strictly being followed for stroke. If any new neurological issues should arise, request PCP place referral for evaluation by one of our neurologists. Thank you.     CC:  PCP: Estanislado Pandy, MD    I spent 30 minutes of face-to-face and non-face-to-face time with patient.  This included previsit chart review, lab review, study review, electronic health record documentation, patient education and discussion regarding above diagnoses and treatment plan and answered all other questions to patient's  satisfaction  Ihor Austin, [facility]  [facility] [address] [place], [place] [address]  Phone [phone] Fax [phone] Note: This document was prepared with digital dictation and possible smart phrase technology. Any transcriptional errors that result from this process are unintentional.

## 2023-05-14 NOTE — Patient Instructions (Addendum)
Continue Crestor 10 mg daily for secondary stroke prevention managed/prescribed by your PCP, Dr. Neita Carp  Continue to follow up with PCP regarding blood pressure and cholesterol management  Maintain strict control of hypertension with blood pressure goal below 130/90 and cholesterol with LDL cholesterol (bad cholesterol) goal below 70 mg/dL.   Signs of a Stroke? Follow the BEFAST method:  Balance Watch for a sudden loss of balance, trouble with coordination or vertigo Eyes Is there a sudden loss of vision in one or both eyes? Or double vision?  Face: Ask the person to smile. Does one side of the face droop or is it numb?  Arms: Ask the person to raise both arms. Does one arm drift downward? Is there weakness or numbness of a leg? Speech: Ask the person to repeat a simple phrase. Does the speech sound slurred/strange? Is the person confused ? Time: If you observe any of these signs, call 911.  Schedule follow-up visit with [facility] for low back pain concerns        Thank you for coming to see [place] at [facility]. I hope we have been able to provide you high quality care today.  You may receive a patient satisfaction survey over the next few weeks. We would appreciate your feedback and comments so that we may continue to improve ourselves and the health of our patients.

## 2023-05-25 ENCOUNTER — Other Ambulatory Visit: Payer: Self-pay | Admitting: Adult Health

## 2023-05-28 ENCOUNTER — Other Ambulatory Visit: Payer: Self-pay | Admitting: Anesthesiology

## 2023-06-18 DIAGNOSIS — E039 Hypothyroidism, unspecified: Secondary | ICD-10-CM | POA: Diagnosis not present

## 2023-06-18 DIAGNOSIS — D709 Neutropenia, unspecified: Secondary | ICD-10-CM | POA: Diagnosis not present

## 2023-06-18 DIAGNOSIS — M0579 Rheumatoid arthritis with rheumatoid factor of multiple sites without organ or systems involvement: Secondary | ICD-10-CM | POA: Diagnosis not present

## 2023-06-18 DIAGNOSIS — E7849 Other hyperlipidemia: Secondary | ICD-10-CM | POA: Diagnosis not present

## 2023-06-18 DIAGNOSIS — E7801 Familial hypercholesterolemia: Secondary | ICD-10-CM | POA: Diagnosis not present

## 2023-06-25 DIAGNOSIS — D709 Neutropenia, unspecified: Secondary | ICD-10-CM | POA: Diagnosis not present

## 2023-06-25 DIAGNOSIS — G629 Polyneuropathy, unspecified: Secondary | ICD-10-CM | POA: Diagnosis not present

## 2023-06-25 DIAGNOSIS — E7849 Other hyperlipidemia: Secondary | ICD-10-CM | POA: Diagnosis not present

## 2023-06-25 DIAGNOSIS — M549 Dorsalgia, unspecified: Secondary | ICD-10-CM | POA: Diagnosis not present

## 2023-06-25 DIAGNOSIS — Z8679 Personal history of other diseases of the circulatory system: Secondary | ICD-10-CM | POA: Diagnosis not present

## 2023-06-25 DIAGNOSIS — E039 Hypothyroidism, unspecified: Secondary | ICD-10-CM | POA: Diagnosis not present

## 2023-06-25 DIAGNOSIS — I10 Essential (primary) hypertension: Secondary | ICD-10-CM | POA: Diagnosis not present

## 2023-06-25 DIAGNOSIS — M05719 Rheumatoid arthritis with rheumatoid factor of unspecified shoulder without organ or systems involvement: Secondary | ICD-10-CM | POA: Diagnosis not present

## 2023-08-14 ENCOUNTER — Other Ambulatory Visit: Payer: Self-pay | Admitting: Adult Health

## 2023-08-23 ENCOUNTER — Other Ambulatory Visit: Payer: Self-pay | Admitting: Adult Health

## 2023-08-23 NOTE — Telephone Encounter (Signed)
Last seen on 05/14/23 per note " Continue Crestor 10 mg daily for secondary stroke prevention managed/prescribed by your PCP, Dr. Neita Carp    No follow up scheduled

## 2023-08-25 ENCOUNTER — Other Ambulatory Visit: Payer: Self-pay | Admitting: Adult Health

## 2023-08-27 NOTE — Telephone Encounter (Signed)
 Last seen on 05/14/23 per note " Continue Crestor 10 mg daily for secondary stroke prevention measures managed/prescribed by PCP "   No follow up scheduled   Rx denied

## 2023-08-29 ENCOUNTER — Other Ambulatory Visit: Payer: Self-pay | Admitting: Adult Health

## 2023-08-29 NOTE — Telephone Encounter (Signed)
 Per note 05/2023 " Continue Crestor 10 mg daily for secondary stroke prevention managed/prescribed by your PCP, Dr. Neita Carp

## 2023-09-04 DIAGNOSIS — M79642 Pain in left hand: Secondary | ICD-10-CM | POA: Diagnosis not present

## 2023-09-04 DIAGNOSIS — Z6822 Body mass index (BMI) 22.0-22.9, adult: Secondary | ICD-10-CM | POA: Diagnosis not present

## 2023-09-17 DIAGNOSIS — Z85828 Personal history of other malignant neoplasm of skin: Secondary | ICD-10-CM | POA: Diagnosis not present

## 2023-09-17 DIAGNOSIS — L821 Other seborrheic keratosis: Secondary | ICD-10-CM | POA: Diagnosis not present

## 2023-09-17 DIAGNOSIS — L570 Actinic keratosis: Secondary | ICD-10-CM | POA: Diagnosis not present

## 2023-10-26 DIAGNOSIS — H53461 Homonymous bilateral field defects, right side: Secondary | ICD-10-CM | POA: Diagnosis not present

## 2023-10-26 DIAGNOSIS — H353131 Nonexudative age-related macular degeneration, bilateral, early dry stage: Secondary | ICD-10-CM | POA: Diagnosis not present

## 2023-10-26 DIAGNOSIS — H04123 Dry eye syndrome of bilateral lacrimal glands: Secondary | ICD-10-CM | POA: Diagnosis not present

## 2023-10-26 DIAGNOSIS — M069 Rheumatoid arthritis, unspecified: Secondary | ICD-10-CM | POA: Diagnosis not present

## 2023-10-26 DIAGNOSIS — Z79899 Other long term (current) drug therapy: Secondary | ICD-10-CM | POA: Diagnosis not present

## 2023-10-30 DIAGNOSIS — Z79899 Other long term (current) drug therapy: Secondary | ICD-10-CM | POA: Diagnosis not present

## 2023-10-30 DIAGNOSIS — M0589 Other rheumatoid arthritis with rheumatoid factor of multiple sites: Secondary | ICD-10-CM | POA: Diagnosis not present

## 2023-10-30 DIAGNOSIS — M5432 Sciatica, left side: Secondary | ICD-10-CM | POA: Diagnosis not present

## 2023-10-30 DIAGNOSIS — D702 Other drug-induced agranulocytosis: Secondary | ICD-10-CM | POA: Diagnosis not present

## 2023-10-30 DIAGNOSIS — R768 Other specified abnormal immunological findings in serum: Secondary | ICD-10-CM | POA: Diagnosis not present

## 2023-10-30 DIAGNOSIS — Z6822 Body mass index (BMI) 22.0-22.9, adult: Secondary | ICD-10-CM | POA: Diagnosis not present

## 2023-11-21 DIAGNOSIS — E7849 Other hyperlipidemia: Secondary | ICD-10-CM | POA: Diagnosis not present

## 2023-11-21 DIAGNOSIS — R42 Dizziness and giddiness: Secondary | ICD-10-CM | POA: Diagnosis not present

## 2023-11-21 DIAGNOSIS — R5383 Other fatigue: Secondary | ICD-10-CM | POA: Diagnosis not present

## 2023-11-21 DIAGNOSIS — I10 Essential (primary) hypertension: Secondary | ICD-10-CM | POA: Diagnosis not present

## 2023-11-21 DIAGNOSIS — E039 Hypothyroidism, unspecified: Secondary | ICD-10-CM | POA: Diagnosis not present

## 2023-11-28 DIAGNOSIS — E782 Mixed hyperlipidemia: Secondary | ICD-10-CM | POA: Diagnosis not present

## 2023-11-28 DIAGNOSIS — Z1331 Encounter for screening for depression: Secondary | ICD-10-CM | POA: Diagnosis not present

## 2023-11-28 DIAGNOSIS — I10 Essential (primary) hypertension: Secondary | ICD-10-CM | POA: Diagnosis not present

## 2023-11-28 DIAGNOSIS — Z6822 Body mass index (BMI) 22.0-22.9, adult: Secondary | ICD-10-CM | POA: Diagnosis not present

## 2023-11-28 DIAGNOSIS — M792 Neuralgia and neuritis, unspecified: Secondary | ICD-10-CM | POA: Diagnosis not present

## 2023-11-28 DIAGNOSIS — E039 Hypothyroidism, unspecified: Secondary | ICD-10-CM | POA: Diagnosis not present

## 2023-11-28 DIAGNOSIS — Z0001 Encounter for general adult medical examination with abnormal findings: Secondary | ICD-10-CM | POA: Diagnosis not present

## 2023-11-28 DIAGNOSIS — Z1389 Encounter for screening for other disorder: Secondary | ICD-10-CM | POA: Diagnosis not present

## 2023-11-28 DIAGNOSIS — Z0000 Encounter for general adult medical examination without abnormal findings: Secondary | ICD-10-CM | POA: Diagnosis not present

## 2024-01-07 DIAGNOSIS — Z1231 Encounter for screening mammogram for malignant neoplasm of breast: Secondary | ICD-10-CM | POA: Diagnosis not present

## 2024-01-07 DIAGNOSIS — Z719 Counseling, unspecified: Secondary | ICD-10-CM | POA: Diagnosis not present

## 2024-01-07 DIAGNOSIS — Z01419 Encounter for gynecological examination (general) (routine) without abnormal findings: Secondary | ICD-10-CM | POA: Diagnosis not present

## 2024-01-07 DIAGNOSIS — Z2821 Immunization not carried out because of patient refusal: Secondary | ICD-10-CM | POA: Diagnosis not present

## 2024-04-01 DIAGNOSIS — Z85828 Personal history of other malignant neoplasm of skin: Secondary | ICD-10-CM | POA: Diagnosis not present

## 2024-04-01 DIAGNOSIS — L821 Other seborrheic keratosis: Secondary | ICD-10-CM | POA: Diagnosis not present

## 2024-04-01 DIAGNOSIS — D1801 Hemangioma of skin and subcutaneous tissue: Secondary | ICD-10-CM | POA: Diagnosis not present

## 2024-04-29 DIAGNOSIS — D702 Other drug-induced agranulocytosis: Secondary | ICD-10-CM | POA: Diagnosis not present

## 2024-04-29 DIAGNOSIS — Z79899 Other long term (current) drug therapy: Secondary | ICD-10-CM | POA: Diagnosis not present

## 2024-04-29 DIAGNOSIS — R7689 Other specified abnormal immunological findings in serum: Secondary | ICD-10-CM | POA: Diagnosis not present

## 2024-04-29 DIAGNOSIS — M5432 Sciatica, left side: Secondary | ICD-10-CM | POA: Diagnosis not present

## 2024-04-29 DIAGNOSIS — M0589 Other rheumatoid arthritis with rheumatoid factor of multiple sites: Secondary | ICD-10-CM | POA: Diagnosis not present

## 2024-04-29 DIAGNOSIS — M25511 Pain in right shoulder: Secondary | ICD-10-CM | POA: Diagnosis not present

## 2024-04-29 DIAGNOSIS — Z6822 Body mass index (BMI) 22.0-22.9, adult: Secondary | ICD-10-CM | POA: Diagnosis not present

## 2024-05-19 DIAGNOSIS — R5383 Other fatigue: Secondary | ICD-10-CM | POA: Diagnosis not present

## 2024-05-19 DIAGNOSIS — E039 Hypothyroidism, unspecified: Secondary | ICD-10-CM | POA: Diagnosis not present

## 2024-05-19 DIAGNOSIS — E7849 Other hyperlipidemia: Secondary | ICD-10-CM | POA: Diagnosis not present

## 2024-05-19 DIAGNOSIS — D519 Vitamin B12 deficiency anemia, unspecified: Secondary | ICD-10-CM | POA: Diagnosis not present

## 2024-05-26 DIAGNOSIS — Z8679 Personal history of other diseases of the circulatory system: Secondary | ICD-10-CM | POA: Diagnosis not present

## 2024-05-26 DIAGNOSIS — E039 Hypothyroidism, unspecified: Secondary | ICD-10-CM | POA: Diagnosis not present

## 2024-05-26 DIAGNOSIS — Z6822 Body mass index (BMI) 22.0-22.9, adult: Secondary | ICD-10-CM | POA: Diagnosis not present

## 2024-05-26 DIAGNOSIS — M792 Neuralgia and neuritis, unspecified: Secondary | ICD-10-CM | POA: Diagnosis not present

## 2024-05-26 DIAGNOSIS — I10 Essential (primary) hypertension: Secondary | ICD-10-CM | POA: Diagnosis not present

## 2024-06-03 DIAGNOSIS — M533 Sacrococcygeal disorders, not elsewhere classified: Secondary | ICD-10-CM | POA: Diagnosis not present

## 2024-06-03 DIAGNOSIS — M4726 Other spondylosis with radiculopathy, lumbar region: Secondary | ICD-10-CM | POA: Diagnosis not present

## 2024-06-03 DIAGNOSIS — M792 Neuralgia and neuritis, unspecified: Secondary | ICD-10-CM | POA: Diagnosis not present
